# Patient Record
Sex: Female | Born: 1960 | Race: White | Hispanic: No | Smoking: Former smoker
Health system: Southern US, Community
[De-identification: ages and names within clinical notes are randomized; demographics above are authoritative.]

## PROBLEM LIST (undated history)

## (undated) DIAGNOSIS — E0590 Thyrotoxicosis, unspecified without thyrotoxic crisis or storm: Secondary | ICD-10-CM

## (undated) DIAGNOSIS — J439 Emphysema, unspecified: Secondary | ICD-10-CM

## (undated) DIAGNOSIS — F419 Anxiety disorder, unspecified: Secondary | ICD-10-CM

## (undated) DIAGNOSIS — Z9889 Other specified postprocedural states: Secondary | ICD-10-CM

## (undated) DIAGNOSIS — R112 Nausea with vomiting, unspecified: Secondary | ICD-10-CM

## (undated) DIAGNOSIS — Z8719 Personal history of other diseases of the digestive system: Secondary | ICD-10-CM

## (undated) DIAGNOSIS — T4145XA Adverse effect of unspecified anesthetic, initial encounter: Secondary | ICD-10-CM

## (undated) DIAGNOSIS — M1990 Unspecified osteoarthritis, unspecified site: Secondary | ICD-10-CM

## (undated) DIAGNOSIS — I10 Essential (primary) hypertension: Secondary | ICD-10-CM

## (undated) DIAGNOSIS — T8859XA Other complications of anesthesia, initial encounter: Secondary | ICD-10-CM

## (undated) DIAGNOSIS — J449 Chronic obstructive pulmonary disease, unspecified: Secondary | ICD-10-CM

## (undated) DIAGNOSIS — K219 Gastro-esophageal reflux disease without esophagitis: Secondary | ICD-10-CM

## (undated) DIAGNOSIS — H269 Unspecified cataract: Secondary | ICD-10-CM

## (undated) DIAGNOSIS — G709 Myoneural disorder, unspecified: Secondary | ICD-10-CM

## (undated) DIAGNOSIS — T7840XA Allergy, unspecified, initial encounter: Secondary | ICD-10-CM

## (undated) DIAGNOSIS — J45909 Unspecified asthma, uncomplicated: Secondary | ICD-10-CM

## (undated) DIAGNOSIS — E079 Disorder of thyroid, unspecified: Secondary | ICD-10-CM

## (undated) HISTORY — DX: Unspecified osteoarthritis, unspecified site: M19.90

## (undated) HISTORY — DX: Emphysema, unspecified: J43.9

## (undated) HISTORY — DX: Myoneural disorder, unspecified: G70.9

## (undated) HISTORY — DX: Unspecified cataract: H26.9

## (undated) HISTORY — PX: TUBAL LIGATION: SHX77

## (undated) HISTORY — DX: Disorder of thyroid, unspecified: E07.9

## (undated) HISTORY — DX: Unspecified asthma, uncomplicated: J45.909

## (undated) HISTORY — PX: SALPINGOOPHORECTOMY: SHX82

## (undated) HISTORY — DX: Anxiety disorder, unspecified: F41.9

## (undated) HISTORY — DX: Gastro-esophageal reflux disease without esophagitis: K21.9

## (undated) HISTORY — DX: Chronic obstructive pulmonary disease, unspecified: J44.9

## (undated) HISTORY — PX: FUNCTIONAL ENDOSCOPIC SINUS SURGERY: SUR616

## (undated) HISTORY — PX: CHOLECYSTECTOMY: SHX55

## (undated) HISTORY — PX: CARPAL TUNNEL RELEASE: SHX101

## (undated) HISTORY — DX: Allergy, unspecified, initial encounter: T78.40XA

## (undated) HISTORY — DX: Essential (primary) hypertension: I10

## (undated) HISTORY — PX: FRACTURE SURGERY: SHX138

---
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
## 2011-01-23 ENCOUNTER — Emergency Department: Payer: Self-pay | Admitting: Emergency Medicine

## 2014-03-19 ENCOUNTER — Emergency Department: Payer: Self-pay | Admitting: Emergency Medicine

## 2014-03-19 LAB — COMPREHENSIVE METABOLIC PANEL
ALT: 16 U/L
ANION GAP: 11 (ref 7–16)
AST: 24 U/L (ref 15–37)
Albumin: 4 g/dL (ref 3.4–5.0)
Alkaline Phosphatase: 104 U/L
BUN: 10 mg/dL (ref 7–18)
Bilirubin,Total: 0.6 mg/dL (ref 0.2–1.0)
CREATININE: 0.86 mg/dL (ref 0.60–1.30)
Calcium, Total: 10.4 mg/dL — ABNORMAL HIGH (ref 8.5–10.1)
Chloride: 101 mmol/L (ref 98–107)
Co2: 24 mmol/L (ref 21–32)
Glucose: 139 mg/dL — ABNORMAL HIGH (ref 65–99)
Osmolality: 273 (ref 275–301)
Potassium: 3 mmol/L — ABNORMAL LOW (ref 3.5–5.1)
Sodium: 136 mmol/L (ref 136–145)
Total Protein: 9.3 g/dL — ABNORMAL HIGH (ref 6.4–8.2)

## 2014-03-19 LAB — DRUG SCREEN, URINE
Amphetamines, Ur Screen: NEGATIVE (ref ?–1000)
BENZODIAZEPINE, UR SCRN: POSITIVE (ref ?–200)
Barbiturates, Ur Screen: NEGATIVE (ref ?–200)
Cannabinoid 50 Ng, Ur ~~LOC~~: NEGATIVE (ref ?–50)
Cocaine Metabolite,Ur ~~LOC~~: NEGATIVE (ref ?–300)
MDMA (Ecstasy)Ur Screen: NEGATIVE (ref ?–500)
Methadone, Ur Screen: NEGATIVE (ref ?–300)
Opiate, Ur Screen: POSITIVE (ref ?–300)
Phencyclidine (PCP) Ur S: NEGATIVE (ref ?–25)
Tricyclic, Ur Screen: NEGATIVE (ref ?–1000)

## 2014-03-19 LAB — CBC
HCT: 48.3 % — ABNORMAL HIGH (ref 35.0–47.0)
HGB: 15.7 g/dL (ref 12.0–16.0)
MCH: 30 pg (ref 26.0–34.0)
MCHC: 32.5 g/dL (ref 32.0–36.0)
MCV: 92 fL (ref 80–100)
Platelet: 269 10*3/uL (ref 150–440)
RBC: 5.23 10*6/uL — AB (ref 3.80–5.20)
RDW: 14.2 % (ref 11.5–14.5)
WBC: 10.9 10*3/uL (ref 3.6–11.0)

## 2014-03-19 LAB — URINALYSIS, COMPLETE
BILIRUBIN, UR: NEGATIVE
Bacteria: NONE SEEN
Glucose,UR: NEGATIVE mg/dL (ref 0–75)
NITRITE: NEGATIVE
PH: 5 (ref 4.5–8.0)
Protein: >=500
Specific Gravity: 1.018 (ref 1.003–1.030)
Squamous Epithelial: 5
WBC UR: 12 /HPF (ref 0–5)

## 2014-03-19 LAB — SALICYLATE LEVEL: Salicylates, Serum: <1.7 mg/dL

## 2014-03-19 LAB — ETHANOL

## 2014-03-19 LAB — ACETAMINOPHEN LEVEL: Acetaminophen: <2 ug/mL

## 2016-08-05 DIAGNOSIS — M7551 Bursitis of right shoulder: Secondary | ICD-10-CM | POA: Diagnosis not present

## 2016-08-05 DIAGNOSIS — M25511 Pain in right shoulder: Secondary | ICD-10-CM | POA: Diagnosis not present

## 2016-10-20 DIAGNOSIS — H2513 Age-related nuclear cataract, bilateral: Secondary | ICD-10-CM | POA: Diagnosis not present

## 2016-12-01 DIAGNOSIS — J441 Chronic obstructive pulmonary disease with (acute) exacerbation: Secondary | ICD-10-CM | POA: Diagnosis not present

## 2018-05-05 ENCOUNTER — Ambulatory Visit: Payer: Self-pay | Admitting: Physician Assistant

## 2018-06-15 DIAGNOSIS — Z23 Encounter for immunization: Secondary | ICD-10-CM | POA: Diagnosis not present

## 2018-09-03 DIAGNOSIS — J440 Chronic obstructive pulmonary disease with acute lower respiratory infection: Secondary | ICD-10-CM | POA: Diagnosis not present

## 2018-10-06 DIAGNOSIS — J449 Chronic obstructive pulmonary disease, unspecified: Secondary | ICD-10-CM | POA: Diagnosis not present

## 2018-11-02 DIAGNOSIS — Z760 Encounter for issue of repeat prescription: Secondary | ICD-10-CM | POA: Diagnosis not present

## 2018-11-02 DIAGNOSIS — J449 Chronic obstructive pulmonary disease, unspecified: Secondary | ICD-10-CM | POA: Diagnosis not present

## 2018-11-02 DIAGNOSIS — I10 Essential (primary) hypertension: Secondary | ICD-10-CM | POA: Diagnosis not present

## 2018-12-13 ENCOUNTER — Other Ambulatory Visit: Payer: Self-pay | Admitting: Physician Assistant

## 2018-12-13 ENCOUNTER — Encounter: Payer: Self-pay | Admitting: Physician Assistant

## 2018-12-13 ENCOUNTER — Ambulatory Visit: Payer: Medicaid Other | Admitting: Physician Assistant

## 2018-12-13 ENCOUNTER — Other Ambulatory Visit: Payer: Self-pay

## 2018-12-13 VITALS — BP 149/90 | HR 78 | Temp 97.0°F | Resp 16 | Ht 62.5 in | Wt 185.2 lb

## 2018-12-13 DIAGNOSIS — Z8639 Personal history of other endocrine, nutritional and metabolic disease: Secondary | ICD-10-CM

## 2018-12-13 DIAGNOSIS — R29818 Other symptoms and signs involving the nervous system: Secondary | ICD-10-CM

## 2018-12-13 DIAGNOSIS — J449 Chronic obstructive pulmonary disease, unspecified: Secondary | ICD-10-CM | POA: Diagnosis not present

## 2018-12-13 DIAGNOSIS — S25101S Unspecified injury of right innominate or subclavian artery, sequela: Secondary | ICD-10-CM | POA: Diagnosis not present

## 2018-12-13 DIAGNOSIS — Z1159 Encounter for screening for other viral diseases: Secondary | ICD-10-CM

## 2018-12-13 DIAGNOSIS — I10 Essential (primary) hypertension: Secondary | ICD-10-CM | POA: Diagnosis not present

## 2018-12-13 DIAGNOSIS — Z114 Encounter for screening for human immunodeficiency virus [HIV]: Secondary | ICD-10-CM | POA: Diagnosis not present

## 2018-12-13 MED ORDER — PROAIR HFA 108 (90 BASE) MCG/ACT IN AERS: 2.0000 | INHALATION_SPRAY | Freq: Four times a day (QID) | RESPIRATORY_TRACT | 1 refills | Status: DC | PRN

## 2018-12-13 MED ORDER — ADVAIR DISKUS 250-50 MCG/DOSE IN AEPB: INHALATION_SPRAY | RESPIRATORY_TRACT | 1 refills | Status: DC

## 2018-12-13 MED ORDER — LISINOPRIL-HYDROCHLOROTHIAZIDE 20-25 MG PO TABS: ORAL_TABLET | ORAL | 0 refills | Status: DC

## 2018-12-13 MED ORDER — SPIRIVA HANDIHALER 18 MCG IN CAPS: 18.0000 ug | ORAL_CAPSULE | Freq: Every day | RESPIRATORY_TRACT | 2 refills | Status: DC

## 2018-12-13 NOTE — Patient Instructions (Addendum)
Miralax capful daily in liquid of choice.    COPD and Physical Activity Chronic obstructive pulmonary disease (COPD) is a long-term (chronic) condition that affects the lungs. COPD is a general term that can be used to describe many different lung problems that cause lung swelling (inflammation) and limit airflow, including chronic bronchitis and emphysema. The main symptom of COPD is shortness of breath, which makes it harder to do even simple tasks. This can also make it harder to exercise and be active. Talk with your health care provider about treatments to help you breathe better and actions you can take to prevent breathing problems during physical activity. What are the benefits of exercising with COPD? Exercising regularly is an important part of a healthy lifestyle. You can still exercise and do physical activities even though you have COPD. Exercise and physical activity improve your shortness of breath by increasing blood flow (circulation). This causes your heart to pump more oxygen through your body. Moderate exercise can improve your:  Oxygen use.  Energy level.  Shortness of breath.  Strength in your breathing muscles.  Heart health.  Sleep.  Self-esteem and feelings of self-worth.  Depression, stress, and anxiety levels. Exercise can benefit everyone with COPD. The severity of your disease may affect how hard you can exercise, especially at first, but everyone can benefit. Talk with your health care provider about how much exercise is safe for you, and which activities and exercises are safe for you. What actions can I take to prevent breathing problems during physical activity?  Sign up for a pulmonary rehabilitation program. This type of program may include: ? Education about lung diseases. ? Exercise classes that teach you how to exercise and be more active while improving your breathing. This usually involves:  Exercise using your lower extremities, such as a  stationary bicycle.  About 30 minutes of exercise, 2 to 5 times per week, for 6 to 12 weeks  Strength training, such as push ups or leg lifts. ? Nutrition education. ? Group classes in which you can talk with others who also have COPD and learn ways to manage stress.  If you use an oxygen tank, you should use it while you exercise. Work with your health care provider to adjust your oxygen for your physical activity. Your resting flow rate is different from your flow rate during physical activity.  While you are exercising: ? Take slow breaths. ? Pace yourself and do not try to go too fast. ? Purse your lips while breathing out. Pursing your lips is similar to a kissing or whistling position. ? If doing exercise that uses a quick burst of effort, such as weight lifting:  Breathe in before starting the exercise.  Breathe out during the hardest part of the exercise (such as raising the weights). Where to find support You can find support for exercising with COPD from:  Your health care provider.  A pulmonary rehabilitation program.  Your local health department or community health programs.  Support groups, online or in-person. Your health care provider may be able to recommend support groups. Where to find more information You can find more information about exercising with COPD from:  American Lung Association: [place].hu.  COPD Foundation: AlmostHot.gl. Contact a health care provider if:  Your symptoms get worse.  You have chest pain.  You have nausea.  You have a fever.  You have trouble talking or catching your breath.  You want to start a new exercise program or a new  activity. Summary  COPD is a general term that can be used to describe many different lung problems that cause lung swelling (inflammation) and limit airflow. This includes chronic bronchitis and emphysema.  Exercise and physical activity improve your shortness of breath by increasing blood  flow (circulation). This causes your heart to provide more oxygen to your body.  Contact your health care provider before starting any exercise program or new activity. Ask your health care provider what exercises and activities are safe for you. This information is not intended to replace advice given to you by your health care provider. Make sure you discuss any questions you have with your health care provider. Document Released: 07/21/2017 Document Revised: 07/21/2017 Document Reviewed: 07/21/2017 Elsevier Interactive Patient Education  2019 [employer]
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
## 2018-12-14 LAB — COMPREHENSIVE METABOLIC PANEL
ALT: 10 IU/L (ref 0–32)
AST: 21 IU/L (ref 0–40)
Albumin/Globulin Ratio: 1.2 (ref 1.2–2.2)
Albumin: 3.7 g/dL — ABNORMAL LOW (ref 3.8–4.9)
Alkaline Phosphatase: 111 IU/L (ref 39–117)
BUN/Creatinine Ratio: 11 (ref 9–23)
BUN: 8 mg/dL (ref 6–24)
Bilirubin Total: 0.2 mg/dL (ref 0.0–1.2)
CO2: 25 mmol/L (ref 20–29)
Calcium: 9.5 mg/dL (ref 8.7–10.2)
Chloride: 104 mmol/L (ref 96–106)
Creatinine, Ser: 0.71 mg/dL (ref 0.57–1.00)
GFR calc Af Amer: 108 mL/min/{1.73_m2} (ref >59)
GFR calc non Af Amer: 94 mL/min/{1.73_m2} (ref >59)
Globulin, Total: 3 g/dL (ref 1.5–4.5)
Glucose: 89 mg/dL (ref 65–99)
Potassium: 5.1 mmol/L (ref 3.5–5.2)
Sodium: 141 mmol/L (ref 134–144)
Total Protein: 6.7 g/dL (ref 6.0–8.5)

## 2018-12-14 LAB — CBC WITH DIFFERENTIAL/PLATELET
Basophils Absolute: 0.1 10*3/uL (ref 0.0–0.2)
Basos: 1 %
EOS (ABSOLUTE): 0.5 10*3/uL — ABNORMAL HIGH (ref 0.0–0.4)
Eos: 4 %
Hematocrit: 37.6 % (ref 34.0–46.6)
Hemoglobin: 13.2 g/dL (ref 11.1–15.9)
Immature Grans (Abs): 0 10*3/uL (ref 0.0–0.1)
Immature Granulocytes: 0 %
Lymphocytes Absolute: 3.1 10*3/uL (ref 0.7–3.1)
Lymphs: 29 %
MCH: 31.5 pg (ref 26.6–33.0)
MCHC: 35.1 g/dL (ref 31.5–35.7)
MCV: 90 fL (ref 79–97)
Monocytes Absolute: 1 10*3/uL — ABNORMAL HIGH (ref 0.1–0.9)
Monocytes: 9 %
Neutrophils Absolute: 6.2 10*3/uL (ref 1.4–7.0)
Neutrophils: 57 %
Platelets: 235 10*3/uL (ref 150–450)
RBC: 4.19 x10E6/uL (ref 3.77–5.28)
RDW: 11.7 % (ref 11.7–15.4)
WBC: 10.9 10*3/uL — ABNORMAL HIGH (ref 3.4–10.8)

## 2018-12-14 LAB — HIV ANTIBODY (ROUTINE TESTING W REFLEX): HIV Screen 4th Generation wRfx: NONREACTIVE

## 2018-12-14 LAB — LIPID PANEL
Chol/HDL Ratio: 3.9 ratio (ref 0.0–4.4)
Cholesterol, Total: 175 mg/dL (ref 100–199)
HDL: 45 mg/dL (ref >39)
LDL Calculated: 111 mg/dL — ABNORMAL HIGH (ref 0–99)
Triglycerides: 96 mg/dL (ref 0–149)
VLDL Cholesterol Cal: 19 mg/dL (ref 5–40)

## 2018-12-14 LAB — TSH: TSH: 0.938 u[IU]/mL (ref 0.450–4.500)

## 2018-12-14 LAB — HCV COMMENT:

## 2018-12-14 LAB — HEPATITIS C ANTIBODY (REFLEX): HCV Ab: 0.1 s/co ratio (ref 0.0–0.9)

## 2018-12-15 ENCOUNTER — Telehealth: Payer: Self-pay

## 2018-12-15 ENCOUNTER — Encounter: Payer: Self-pay | Admitting: Physician Assistant

## 2018-12-15 NOTE — Telephone Encounter (Signed)
Patient advised as below.  

## 2018-12-15 NOTE — Telephone Encounter (Signed)
-----   Message from [name], [place] sent at 12/15/2018 11:23 AM EDT ----- Christy Mason looks normal. Cholesterol is a little high but if we control her blood pressure and work on getting her to stop smoking, it will likely improve and not need a medication. Can discuss more at follow up.

## 2018-12-20 ENCOUNTER — Telehealth: Payer: Self-pay | Admitting: Physician Assistant

## 2018-12-20 NOTE — Telephone Encounter (Signed)
Patient advised as below. Patient reports she wants to wait on in lab sleep study. Patient will call us back when she is ready.

## 2018-12-20 NOTE — Telephone Encounter (Signed)
A home sleep study was ordered for pt but she has Medicaid and they will not pay unless it's done in lab

## 2018-12-20 NOTE — Telephone Encounter (Signed)
Can we call patient and let her know medicaid requires in lab sleep study? If she is agreeable, I will place order.

## 2019-01-08 ENCOUNTER — Encounter (INDEPENDENT_AMBULATORY_CARE_PROVIDER_SITE_OTHER): Payer: Self-pay

## 2019-01-08 ENCOUNTER — Ambulatory Visit (INDEPENDENT_AMBULATORY_CARE_PROVIDER_SITE_OTHER): Payer: Medicaid Other

## 2019-01-08 ENCOUNTER — Ambulatory Visit (INDEPENDENT_AMBULATORY_CARE_PROVIDER_SITE_OTHER): Payer: Medicaid Other | Admitting: Vascular Surgery

## 2019-01-08 ENCOUNTER — Encounter (INDEPENDENT_AMBULATORY_CARE_PROVIDER_SITE_OTHER): Payer: Self-pay | Admitting: Vascular Surgery

## 2019-01-08 ENCOUNTER — Other Ambulatory Visit: Payer: Self-pay

## 2019-01-08 ENCOUNTER — Other Ambulatory Visit (INDEPENDENT_AMBULATORY_CARE_PROVIDER_SITE_OTHER): Payer: Self-pay | Admitting: Vascular Surgery

## 2019-01-08 DIAGNOSIS — I10 Essential (primary) hypertension: Secondary | ICD-10-CM

## 2019-01-08 DIAGNOSIS — J449 Chronic obstructive pulmonary disease, unspecified: Secondary | ICD-10-CM | POA: Diagnosis not present

## 2019-01-08 DIAGNOSIS — S25101S Unspecified injury of right innominate or subclavian artery, sequela: Secondary | ICD-10-CM

## 2019-01-08 DIAGNOSIS — T829XXS Unspecified complication of cardiac and vascular prosthetic device, implant and graft, sequela: Secondary | ICD-10-CM

## 2019-01-08 DIAGNOSIS — T829XXA Unspecified complication of cardiac and vascular prosthetic device, implant and graft, initial encounter: Secondary | ICD-10-CM | POA: Diagnosis not present

## 2019-01-08 DIAGNOSIS — Z79899 Other long term (current) drug therapy: Secondary | ICD-10-CM

## 2019-01-08 DIAGNOSIS — S25109A Unspecified injury of unspecified innominate or subclavian artery, initial encounter: Secondary | ICD-10-CM | POA: Insufficient documentation

## 2019-01-08 DIAGNOSIS — F17210 Nicotine dependence, cigarettes, uncomplicated: Secondary | ICD-10-CM
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
## 2019-01-10 ENCOUNTER — Ambulatory Visit (INDEPENDENT_AMBULATORY_CARE_PROVIDER_SITE_OTHER): Payer: Medicaid Other | Admitting: Physician Assistant

## 2019-01-10 ENCOUNTER — Other Ambulatory Visit (HOSPITAL_COMMUNITY)
Admission: RE | Admit: 2019-01-10 | Discharge: 2019-01-10 | Disposition: A | Discharge status: Home / Self Care | Payer: Medicaid Other | Source: Ambulatory Visit | Attending: Physician Assistant | Admitting: Physician Assistant

## 2019-01-10 ENCOUNTER — Other Ambulatory Visit: Payer: Self-pay | Admitting: Physician Assistant

## 2019-01-10 ENCOUNTER — Other Ambulatory Visit: Payer: Self-pay

## 2019-01-10 ENCOUNTER — Encounter: Payer: Self-pay | Admitting: Physician Assistant

## 2019-01-10 VITALS — BP 131/89 | HR 92 | Temp 98.5°F | Resp 16 | Ht 62.5 in | Wt 185.0 lb

## 2019-01-10 DIAGNOSIS — Z124 Encounter for screening for malignant neoplasm of cervix: Secondary | ICD-10-CM | POA: Insufficient documentation

## 2019-01-10 DIAGNOSIS — Z1239 Encounter for other screening for malignant neoplasm of breast: Secondary | ICD-10-CM | POA: Diagnosis not present

## 2019-01-10 DIAGNOSIS — K5903 Drug induced constipation: Secondary | ICD-10-CM | POA: Diagnosis not present

## 2019-01-10 DIAGNOSIS — T402X5A Adverse effect of other opioids, initial encounter: Secondary | ICD-10-CM | POA: Diagnosis not present

## 2019-01-10 DIAGNOSIS — Z1211 Encounter for screening for malignant neoplasm of colon: Secondary | ICD-10-CM | POA: Diagnosis not present

## 2019-01-10 DIAGNOSIS — G8929 Other chronic pain: Secondary | ICD-10-CM | POA: Diagnosis not present

## 2019-01-10 DIAGNOSIS — M25511 Pain in right shoulder: Secondary | ICD-10-CM

## 2019-01-10 DIAGNOSIS — I10 Essential (primary) hypertension: Secondary | ICD-10-CM

## 2019-01-10 DIAGNOSIS — Z0000 Encounter for general adult medical examination without abnormal findings: Secondary | ICD-10-CM | POA: Diagnosis not present

## 2019-01-10 MED ORDER — LUBIPROSTONE 24 MCG PO CAPS: 24.0000 ug | ORAL_CAPSULE | Freq: Two times a day (BID) | ORAL | 0 refills | Status: AC
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
## 2019-01-10 NOTE — Patient Instructions (Signed)
Health Maintenance, Female Adopting a healthy lifestyle and getting preventive care are important in promoting health and wellness. Ask your health care provider about:  The right schedule for you to have regular tests and exams.  Things you can do on your own to prevent diseases and keep yourself healthy. What should I know about diet, weight, and exercise? Eat a healthy diet   Eat a diet that includes plenty of vegetables, fruits, low-fat dairy products, and lean protein.  Do not eat a lot of foods that are high in solid fats, added sugars, or sodium. Maintain a healthy weight Body mass index (BMI) is used to identify weight problems. It estimates body fat based on height and weight. Your health care provider can help determine your BMI and help you achieve or maintain a healthy weight. Get regular exercise Get regular exercise. This is one of the most important things you can do for your health. Most adults should:  Exercise for at least 150 minutes each week. The exercise should increase your heart rate and make you sweat (moderate-intensity exercise).  Do strengthening exercises at least twice a week. This is in addition to the moderate-intensity exercise.  Spend less time sitting. Even light physical activity can be beneficial. Watch cholesterol and blood lipids Have your blood tested for lipids and cholesterol at 60 years of age, then have this test every 5 years. Have your cholesterol levels checked more often if:  Your lipid or cholesterol levels are high.  You are older than 60 years of age.  You are at high risk for heart disease. What should I know about cancer screening? Depending on your health history and family history, you may need to have cancer screening at various ages. This may include screening for:  Breast cancer.  Cervical cancer.  Colorectal cancer.  Skin cancer.  Lung cancer. What should I know about heart disease, diabetes, and high blood  pressure? Blood pressure and heart disease  High blood pressure causes heart disease and increases the risk of stroke. This is more likely to develop in people who have high blood pressure readings, are of African descent, or are overweight.  Have your blood pressure checked: ? Every 3-5 years if you are 18-39 years of age. ? Every year if you are 40 years old or older. Diabetes Have regular diabetes screenings. This checks your fasting blood sugar level. Have the screening done:  Once every three years after age 40 if you are at a normal weight and have a low risk for diabetes.  More often and at a younger age if you are overweight or have a high risk for diabetes. What should I know about preventing infection? Hepatitis B If you have a higher risk for hepatitis B, you should be screened for this virus. Talk with your health care provider to find out if you are at risk for hepatitis B infection. Hepatitis C Testing is recommended for:  Everyone born from 1945 through 1965.  Anyone with known risk factors for hepatitis C. Sexually transmitted infections (STIs)  Get screened for STIs, including gonorrhea and chlamydia, if: ? You are sexually active and are younger than 60 years of age. ? You are older than 60 years of age and your health care provider tells you that you are at risk for this type of infection. ? Your sexual activity has changed since you were last screened, and you are at increased risk for chlamydia or gonorrhea. Ask your health care provider if   you are at risk.  Ask your health care provider about whether you are at high risk for HIV. Your health care provider may recommend a prescription medicine to help prevent HIV infection. If you choose to take medicine to prevent HIV, you should first get tested for HIV. You should then be tested every 3 months for as long as you are taking the medicine. Pregnancy  If you are about to stop having your period (premenopausal) and  you may become pregnant, seek counseling before you get pregnant.  Take 400 to 800 micrograms (mcg) of folic acid every day if you become pregnant.  Ask for birth control (contraception) if you want to prevent pregnancy. Osteoporosis and menopause Osteoporosis is a disease in which the bones lose minerals and strength with aging. This can result in bone fractures. If you are 65 years old or older, or if you are at risk for osteoporosis and fractures, ask your health care provider if you should:  Be screened for bone loss.  Take a calcium or vitamin D supplement to lower your risk of fractures.  Be given hormone replacement therapy (HRT) to treat symptoms of menopause. Follow these instructions at home: Lifestyle  Do not use any products that contain nicotine or tobacco, such as cigarettes, e-cigarettes, and chewing tobacco. If you need help quitting, ask your health care provider.  Do not use street drugs.  Do not share needles.  Ask your health care provider for help if you need support or information about quitting drugs. Alcohol use  Do not drink alcohol if: ? Your health care provider tells you not to drink. ? You are pregnant, may be pregnant, or are planning to become pregnant.  If you drink alcohol: ? Limit how much you use to 0-1 drink a day. ? Limit intake if you are breastfeeding.  Be aware of how much alcohol is in your drink. In the [place] one drink equals one 12 oz bottle of beer (355 mL), one 5 oz glass of wine (148 mL), or one 1 oz glass of hard liquor (44 mL). General instructions  Schedule regular health, dental, and eye exams.  Stay current with your vaccines.  Tell your health care provider if: ? You often feel depressed. ? You have ever been abused or do not feel safe at home. Summary  Adopting a healthy lifestyle and getting preventive care are important in promoting health and wellness.  Follow your health care provider's instructions about healthy  diet, exercising, and getting tested or screened for diseases.  Follow your health care provider's instructions on monitoring your cholesterol and blood pressure. This information is not intended to replace advice given to you by your health care provider. Make sure you discuss any questions you have with your health care provider. Document Released: 01/11/2011 Document Revised: 06/21/2018 Document Reviewed: 06/21/2018 Elsevier Patient Education  2020 Elsevier Inc.  

## 2019-01-16 ENCOUNTER — Telehealth: Payer: Self-pay

## 2019-01-16 LAB — CYTOLOGY - PAP
Diagnosis: NEGATIVE
HPV: NOT DETECTED

## 2019-01-16 NOTE — Telephone Encounter (Signed)
Patient advised as below.  

## 2019-01-16 NOTE — Telephone Encounter (Signed)
NA

## 2019-01-16 NOTE — Telephone Encounter (Signed)
-----   Message from Trinna Post, [place] sent at 01/16/2019 10:56 AM EDT ----- PAP is normal and HPV is negative. Repeat in 5 years.

## 2019-01-17 ENCOUNTER — Other Ambulatory Visit (INDEPENDENT_AMBULATORY_CARE_PROVIDER_SITE_OTHER): Payer: Self-pay | Admitting: Nurse Practitioner

## 2019-01-19 ENCOUNTER — Other Ambulatory Visit: Payer: Self-pay

## 2019-01-19 ENCOUNTER — Other Ambulatory Visit
Admission: RE | Admit: 2019-01-19 | Discharge: 2019-01-19 | Disposition: A | Discharge status: Home / Self Care | Payer: Medicaid Other | Source: Ambulatory Visit | Attending: Vascular Surgery | Admitting: Vascular Surgery

## 2019-01-19 DIAGNOSIS — Z01812 Encounter for preprocedural laboratory examination: Secondary | ICD-10-CM | POA: Insufficient documentation

## 2019-01-19 DIAGNOSIS — Z1159 Encounter for screening for other viral diseases: Secondary | ICD-10-CM | POA: Insufficient documentation

## 2019-01-19 LAB — CREATININE, SERUM
Creatinine, Ser: 0.83 mg/dL (ref 0.44–1.00)
GFR calc Af Amer: >60 mL/min (ref >60)
GFR calc non Af Amer: >60 mL/min (ref >60)

## 2019-01-19 LAB — SARS CORONAVIRUS 2 (TAT 6-24 HRS): SARS Coronavirus 2: NEGATIVE

## 2019-01-19 LAB — BUN: BUN: 12 mg/dL (ref 6–20)

## 2019-01-22 MED ORDER — CEFAZOLIN SODIUM-DEXTROSE 2-4 GM/100ML-% IV SOLN: 2.0000 g | Freq: Once | INTRAVENOUS | Status: DC

## 2019-01-23 ENCOUNTER — Other Ambulatory Visit: Payer: Self-pay

## 2019-01-23 ENCOUNTER — Ambulatory Visit
Admission: RE | Admit: 2019-01-23 | Discharge: 2019-01-23 | Disposition: A | Discharge status: Home / Self Care | Payer: Medicaid Other | Attending: Vascular Surgery | Admitting: Vascular Surgery

## 2019-01-23 ENCOUNTER — Encounter
Admission: RE | Disposition: A | Discharge status: Home / Self Care | Payer: Self-pay | Source: Home / Self Care | Attending: Vascular Surgery

## 2019-01-23 DIAGNOSIS — T82856A Stenosis of peripheral vascular stent, initial encounter: Secondary | ICD-10-CM | POA: Diagnosis not present

## 2019-01-23 DIAGNOSIS — J449 Chronic obstructive pulmonary disease, unspecified: Secondary | ICD-10-CM | POA: Insufficient documentation

## 2019-01-23 DIAGNOSIS — I10 Essential (primary) hypertension: Secondary | ICD-10-CM | POA: Insufficient documentation

## 2019-01-23 DIAGNOSIS — F17210 Nicotine dependence, cigarettes, uncomplicated: Secondary | ICD-10-CM | POA: Diagnosis not present

## 2019-01-23 DIAGNOSIS — F419 Anxiety disorder, unspecified: Secondary | ICD-10-CM | POA: Diagnosis not present

## 2019-01-23 DIAGNOSIS — S25101S Unspecified injury of right innominate or subclavian artery, sequela: Secondary | ICD-10-CM | POA: Diagnosis not present

## 2019-01-23 DIAGNOSIS — K219 Gastro-esophageal reflux disease without esophagitis: Secondary | ICD-10-CM | POA: Insufficient documentation

## 2019-01-23 DIAGNOSIS — I70229 Atherosclerosis of native arteries of extremities with rest pain, unspecified extremity: Secondary | ICD-10-CM

## 2019-01-23 DIAGNOSIS — I70228 Atherosclerosis of native arteries of extremities with rest pain, other extremity: Secondary | ICD-10-CM | POA: Diagnosis not present

## 2019-01-23 DIAGNOSIS — E079 Disorder of thyroid, unspecified: Secondary | ICD-10-CM | POA: Diagnosis not present

## 2019-01-23 HISTORY — PX: UPPER EXTREMITY ANGIOGRAPHY: CATH118270

## 2019-01-23 SURGERY — UPPER EXTREMITY ANGIOGRAPHY
Anesthesia: Moderate Sedation | Laterality: Right

## 2019-01-23 MED ORDER — ONDANSETRON HCL 4 MG/2ML IJ SOLN: 4.0000 mg | Freq: Four times a day (QID) | INTRAMUSCULAR | Status: DC | PRN

## 2019-01-23 MED ORDER — FENTANYL CITRATE (PF) 100 MCG/2ML IJ SOLN
INTRAMUSCULAR | Status: DC | PRN
  Administered 2019-01-23: 25 ug via INTRAVENOUS
  Administered 2019-01-23: 50 ug via INTRAVENOUS

## 2019-01-23 MED ORDER — LABETALOL HCL 5 MG/ML IV SOLN: 10.0000 mg | INTRAVENOUS | Status: DC | PRN

## 2019-01-23 MED ORDER — FENTANYL CITRATE (PF) 100 MCG/2ML IJ SOLN
INTRAMUSCULAR | Status: AC
  Administered 2019-01-23: 10:00:00
  Filled 2019-01-23: qty 2

## 2019-01-23 MED ORDER — HEPARIN (PORCINE) IN NACL 1000-0.9 UT/500ML-% IV SOLN
INTRAVENOUS | Status: AC
  Filled 2019-01-23: qty 1000

## 2019-01-23 MED ORDER — LIDOCAINE HCL (PF) 1 % IJ SOLN
INTRAMUSCULAR | Status: AC
  Filled 2019-01-23: qty 30

## 2019-01-23 MED ORDER — MORPHINE SULFATE (PF) 4 MG/ML IV SOLN: 2.0000 mg | INTRAVENOUS | Status: DC | PRN

## 2019-01-23 MED ORDER — MIDAZOLAM HCL 2 MG/2ML IJ SOLN
INTRAMUSCULAR | Status: DC | PRN
  Administered 2019-01-23: 0.5 mg via INTRAVENOUS
  Administered 2019-01-23: 1 mg via INTRAVENOUS

## 2019-01-23 MED ORDER — SODIUM CHLORIDE 0.9% FLUSH: 3.0000 mL | INTRAVENOUS | Status: DC | PRN

## 2019-01-23 MED ORDER — HEPARIN SODIUM (PORCINE) 1000 UNIT/ML IJ SOLN
INTRAMUSCULAR | Status: AC
  Filled 2019-01-23: qty 1

## 2019-01-23 MED ORDER — HEPARIN SODIUM (PORCINE) 1000 UNIT/ML IJ SOLN
INTRAMUSCULAR | Status: DC | PRN
  Administered 2019-01-23: 5000 [IU] via INTRAVENOUS

## 2019-01-23 MED ORDER — SODIUM CHLORIDE 0.9 % IV BOLUS
INTRAVENOUS | Status: AC | PRN
  Administered 2019-01-23: 250 mL via INTRAVENOUS

## 2019-01-23 MED ORDER — SODIUM CHLORIDE 0.9 % IV SOLN
INTRAVENOUS | Status: DC
  Administered 2019-01-23: 1000 mL via INTRAVENOUS

## 2019-01-23 MED ORDER — HYDRALAZINE HCL 20 MG/ML IJ SOLN: 5.0000 mg | INTRAMUSCULAR | Status: DC | PRN

## 2019-01-23 MED ORDER — HYDROMORPHONE HCL 1 MG/ML IJ SOLN: 1.0000 mg | Freq: Once | INTRAMUSCULAR | Status: DC | PRN

## 2019-01-23 MED ORDER — CEFAZOLIN SODIUM-DEXTROSE 2-4 GM/100ML-% IV SOLN
INTRAVENOUS | Status: AC
  Administered 2019-01-23: 2 g
  Filled 2019-01-23: qty 100

## 2019-01-23 MED ORDER — DIPHENHYDRAMINE HCL 50 MG/ML IJ SOLN: 50.0000 mg | Freq: Once | INTRAMUSCULAR | Status: DC | PRN

## 2019-01-23 MED ORDER — SODIUM CHLORIDE 0.9% FLUSH: 3.0000 mL | Freq: Two times a day (BID) | INTRAVENOUS | Status: DC

## 2019-01-23 MED ORDER — SODIUM CHLORIDE 0.9 % IV SOLN: INTRAVENOUS | Status: DC

## 2019-01-23 MED ORDER — SODIUM CHLORIDE 0.9 % IV SOLN: 250.0000 mL | INTRAVENOUS | Status: DC | PRN

## 2019-01-23 MED ORDER — MIDAZOLAM HCL 2 MG/ML PO SYRP
ORAL_SOLUTION | ORAL | Status: AC
  Filled 2019-01-23: qty 4

## 2019-01-23 MED ORDER — MIDAZOLAM HCL 2 MG/ML PO SYRP
8.0000 mg | ORAL_SOLUTION | Freq: Once | ORAL | Status: AC | PRN
  Administered 2019-01-23: 8 mg via ORAL

## 2019-01-23 MED ORDER — MIDAZOLAM HCL 5 MG/5ML IJ SOLN
INTRAMUSCULAR | Status: AC
  Administered 2019-01-23: 1 mg
  Filled 2019-01-23: qty 5

## 2019-01-23 MED ORDER — FAMOTIDINE 20 MG PO TABS: 40.0000 mg | ORAL_TABLET | Freq: Once | ORAL | Status: DC | PRN

## 2019-01-23 MED ORDER — METHYLPREDNISOLONE SODIUM SUCC 125 MG IJ SOLR: 125.0000 mg | Freq: Once | INTRAMUSCULAR | Status: DC | PRN

## 2019-01-23 MED ORDER — ACETAMINOPHEN 325 MG PO TABS: 650.0000 mg | ORAL_TABLET | ORAL | Status: DC | PRN

## 2019-01-23 MED ORDER — OXYCODONE HCL 5 MG PO TABS: 5.0000 mg | ORAL_TABLET | ORAL | Status: DC | PRN

## 2019-01-23 SURGICAL SUPPLY — 16 items
CATH ANGIO 5F 100CM .035 PIG (CATHETERS) ×1 IMPLANT
CATH BEACON 5 .035 100 H1 TIP (CATHETERS) ×1 IMPLANT
CATH INFINITI JR4 5F (CATHETERS) ×1 IMPLANT
CATH VERT 5FR 125CM (CATHETERS) ×1 IMPLANT
DEVICE STARCLOSE SE CLOSURE (Vascular Products) ×1 IMPLANT
DEVICE TORQUE .025-.038 (MISCELLANEOUS) ×2 IMPLANT
GLIDEWIRE ADV .035X180CM (WIRE) ×1 IMPLANT
NDL ENTRY 21GA 7CM ECHOTIP (NEEDLE) IMPLANT
NEEDLE ENTRY 21GA 7CM ECHOTIP (NEEDLE) ×2 IMPLANT
SET INTRO CAPELLA COAXIAL (SET/KITS/TRAYS/PACK) ×1 IMPLANT
SHEATH BRITE TIP 5FRX11 (SHEATH) ×1 IMPLANT
SHEATH RAABE 6FRX70 (SHEATH) ×1 IMPLANT
TUBING CONTRAST HIGH PRESS 72 (TUBING) ×1 IMPLANT
WIRE AQUATRACK .035X260CM (WIRE) ×1 IMPLANT
WIRE J 3MM .035X145CM (WIRE) ×1 IMPLANT
WIRE MAGIC TORQUE 260C (WIRE) ×1 IMPLANT
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
## 2019-01-23 NOTE — H&P (Signed)
[facility] History & Physical Update  The patient was interviewed and re-examined.  The patient's previous History and Physical has been reviewed and is unchanged.  There is no change in the plan of care. We plan to proceed with the scheduled procedure.  Hortencia Pilar, MD  01/23/2019, 9:33 AM

## 2019-01-24 ENCOUNTER — Encounter: Payer: Self-pay | Admitting: Vascular Surgery

## 2019-01-29 ENCOUNTER — Ambulatory Visit (INDEPENDENT_AMBULATORY_CARE_PROVIDER_SITE_OTHER): Payer: Medicaid Other | Admitting: Vascular Surgery

## 2019-01-29 ENCOUNTER — Other Ambulatory Visit: Payer: Self-pay

## 2019-01-29 ENCOUNTER — Encounter (INDEPENDENT_AMBULATORY_CARE_PROVIDER_SITE_OTHER): Payer: Self-pay | Admitting: Vascular Surgery

## 2019-01-29 VITALS — BP 108/60 | HR 80 | Resp 16 | Wt 184.0 lb

## 2019-01-29 DIAGNOSIS — J449 Chronic obstructive pulmonary disease, unspecified: Secondary | ICD-10-CM | POA: Diagnosis not present

## 2019-01-29 DIAGNOSIS — S25101S Unspecified injury of right innominate or subclavian artery, sequela: Secondary | ICD-10-CM

## 2019-01-29 DIAGNOSIS — T829XXS Unspecified complication of cardiac and vascular prosthetic device, implant and graft, sequela: Secondary | ICD-10-CM

## 2019-01-29 DIAGNOSIS — T82856A Stenosis of peripheral vascular stent, initial encounter: Secondary | ICD-10-CM | POA: Diagnosis not present

## 2019-01-29 DIAGNOSIS — I10 Essential (primary) hypertension: Secondary | ICD-10-CM | POA: Diagnosis not present
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
## 2019-01-30 ENCOUNTER — Telehealth (INDEPENDENT_AMBULATORY_CARE_PROVIDER_SITE_OTHER): Payer: Self-pay

## 2019-01-30 ENCOUNTER — Other Ambulatory Visit (INDEPENDENT_AMBULATORY_CARE_PROVIDER_SITE_OTHER): Payer: Self-pay

## 2019-01-30 NOTE — Telephone Encounter (Signed)
Spoke with the patient and she is now scheduled with Dr. Lucky Cowboy for 02/07/2019 with a 10:00 am arrival to to the MM, patient will do her Covid testing/ pre-op  on 02/02/2019 at 1:30 pm. Pre-procedure instructions were discussed and this will also be mailed out to the patient.

## 2019-02-02 ENCOUNTER — Other Ambulatory Visit: Payer: Self-pay

## 2019-02-02 ENCOUNTER — Encounter
Admission: RE | Admit: 2019-02-02 | Discharge: 2019-02-02 | Disposition: A | Discharge status: Home / Self Care | Payer: Medicaid Other | Source: Ambulatory Visit | Attending: Vascular Surgery | Admitting: Vascular Surgery

## 2019-02-02 DIAGNOSIS — I10 Essential (primary) hypertension: Secondary | ICD-10-CM | POA: Insufficient documentation

## 2019-02-02 DIAGNOSIS — Z01818 Encounter for other preprocedural examination: Secondary | ICD-10-CM | POA: Insufficient documentation

## 2019-02-02 DIAGNOSIS — Z1159 Encounter for screening for other viral diseases: Secondary | ICD-10-CM | POA: Insufficient documentation

## 2019-02-02 DIAGNOSIS — F17210 Nicotine dependence, cigarettes, uncomplicated: Secondary | ICD-10-CM | POA: Diagnosis not present

## 2019-02-02 HISTORY — DX: Thyrotoxicosis, unspecified without thyrotoxic crisis or storm: E05.90

## 2019-02-02 HISTORY — DX: Other complications of anesthesia, initial encounter: T88.59XA

## 2019-02-02 HISTORY — DX: Other specified postprocedural states: Z98.890

## 2019-02-02 HISTORY — DX: Personal history of other diseases of the digestive system: Z87.19

## 2019-02-02 HISTORY — DX: Nausea with vomiting, unspecified: R11.2

## 2019-02-02 LAB — BASIC METABOLIC PANEL
Anion gap: 8 (ref 5–15)
BUN: 13 mg/dL (ref 6–20)
CO2: 28 mmol/L (ref 22–32)
Calcium: 9.1 mg/dL (ref 8.9–10.3)
Chloride: 100 mmol/L (ref 98–111)
Creatinine, Ser: 1.01 mg/dL — ABNORMAL HIGH (ref 0.44–1.00)
GFR calc Af Amer: >60 mL/min (ref >60)
GFR calc non Af Amer: >60 mL/min (ref >60)
Glucose, Bld: 89 mg/dL (ref 70–99)
Potassium: 4.1 mmol/L (ref 3.5–5.1)
Sodium: 136 mmol/L (ref 135–145)

## 2019-02-03 LAB — SARS CORONAVIRUS 2 (TAT 6-24 HRS): SARS Coronavirus 2: NEGATIVE

## 2019-02-06 MED ORDER — CEFAZOLIN SODIUM-DEXTROSE 2-4 GM/100ML-% IV SOLN
2.0000 g | Freq: Once | INTRAVENOUS | Status: AC
  Administered 2019-02-07: 14:00:00 2 g via INTRAVENOUS

## 2019-02-07 ENCOUNTER — Encounter
Admission: RE | Disposition: A | Discharge status: Home / Self Care | Payer: Self-pay | Source: Ambulatory Visit | Attending: Vascular Surgery

## 2019-02-07 ENCOUNTER — Ambulatory Visit: Payer: Medicaid Other | Admitting: Anesthesiology

## 2019-02-07 ENCOUNTER — Other Ambulatory Visit: Payer: Self-pay

## 2019-02-07 ENCOUNTER — Observation Stay
Admission: RE | Admit: 2019-02-07 | Discharge: 2019-02-09 | Disposition: A | Discharge status: Home / Self Care | Payer: Medicaid Other | Source: Ambulatory Visit | Attending: Vascular Surgery | Admitting: Vascular Surgery

## 2019-02-07 DIAGNOSIS — M1990 Unspecified osteoarthritis, unspecified site: Secondary | ICD-10-CM | POA: Diagnosis not present

## 2019-02-07 DIAGNOSIS — I70228 Atherosclerosis of native arteries of extremities with rest pain, other extremity: Secondary | ICD-10-CM | POA: Diagnosis not present

## 2019-02-07 DIAGNOSIS — F419 Anxiety disorder, unspecified: Secondary | ICD-10-CM | POA: Insufficient documentation

## 2019-02-07 DIAGNOSIS — M79601 Pain in right arm: Secondary | ICD-10-CM | POA: Insufficient documentation

## 2019-02-07 DIAGNOSIS — F17210 Nicotine dependence, cigarettes, uncomplicated: Secondary | ICD-10-CM | POA: Insufficient documentation

## 2019-02-07 DIAGNOSIS — T82856A Stenosis of peripheral vascular stent, initial encounter: Secondary | ICD-10-CM | POA: Diagnosis not present

## 2019-02-07 DIAGNOSIS — Z79899 Other long term (current) drug therapy: Secondary | ICD-10-CM | POA: Diagnosis not present

## 2019-02-07 DIAGNOSIS — K449 Diaphragmatic hernia without obstruction or gangrene: Secondary | ICD-10-CM | POA: Diagnosis not present

## 2019-02-07 DIAGNOSIS — K219 Gastro-esophageal reflux disease without esophagitis: Secondary | ICD-10-CM | POA: Insufficient documentation

## 2019-02-07 DIAGNOSIS — I70229 Atherosclerosis of native arteries of extremities with rest pain, unspecified extremity: Secondary | ICD-10-CM

## 2019-02-07 DIAGNOSIS — S25111S Minor laceration of right innominate or subclavian artery, sequela: Secondary | ICD-10-CM | POA: Diagnosis not present

## 2019-02-07 DIAGNOSIS — I10 Essential (primary) hypertension: Secondary | ICD-10-CM | POA: Diagnosis not present

## 2019-02-07 DIAGNOSIS — E0590 Thyrotoxicosis, unspecified without thyrotoxic crisis or storm: Secondary | ICD-10-CM | POA: Diagnosis not present

## 2019-02-07 DIAGNOSIS — Z7982 Long term (current) use of aspirin: Secondary | ICD-10-CM | POA: Insufficient documentation

## 2019-02-07 DIAGNOSIS — E079 Disorder of thyroid, unspecified: Secondary | ICD-10-CM | POA: Diagnosis not present

## 2019-02-07 DIAGNOSIS — J449 Chronic obstructive pulmonary disease, unspecified: Secondary | ICD-10-CM | POA: Insufficient documentation

## 2019-02-07 HISTORY — PX: UPPER EXTREMITY ANGIOGRAPHY: CATH118270

## 2019-02-07 SURGERY — UPPER EXTREMITY ANGIOGRAPHY
Anesthesia: General | Site: Arm Upper | Laterality: Right

## 2019-02-07 MED ORDER — VITAMIN C 500 MG PO TABS
250.0000 mg | ORAL_TABLET | Freq: Every day | ORAL | Status: DC
  Administered 2019-02-08 – 2019-02-09 (×2): 250 mg via ORAL
  Filled 2019-02-07 (×2): qty 1

## 2019-02-07 MED ORDER — MIDAZOLAM HCL 2 MG/2ML IJ SOLN
INTRAMUSCULAR | Status: AC
  Filled 2019-02-07: qty 2

## 2019-02-07 MED ORDER — FENTANYL 40 MCG/ML IV SOLN
INTRAVENOUS | Status: DC
  Filled 2019-02-07 (×11): qty 25

## 2019-02-07 MED ORDER — FLUTICASONE PROPIONATE 50 MCG/ACT NA SUSP
2.0000 | Freq: Every day | NASAL | Status: DC
  Filled 2019-02-07: qty 16

## 2019-02-07 MED ORDER — ASPIRIN 81 MG PO CHEW
CHEWABLE_TABLET | ORAL | Status: AC
  Filled 2019-02-07: qty 1

## 2019-02-07 MED ORDER — ASPIRIN EC 81 MG PO TBEC
81.0000 mg | DELAYED_RELEASE_TABLET | Freq: Every day | ORAL | Status: DC
  Administered 2019-02-07 – 2019-02-09 (×3): 81 mg via ORAL
  Filled 2019-02-07 (×2): qty 1

## 2019-02-07 MED ORDER — SUCCINYLCHOLINE CHLORIDE 20 MG/ML IJ SOLN
INTRAMUSCULAR | Status: DC | PRN
  Administered 2019-02-07: 120 mg via INTRAVENOUS

## 2019-02-07 MED ORDER — POLYETHYLENE GLYCOL 3350 17 G PO PACK: 17.0000 g | PACK | Freq: Every day | ORAL | Status: DC | PRN

## 2019-02-07 MED ORDER — ONDANSETRON HCL 4 MG/2ML IJ SOLN: 4.0000 mg | Freq: Once | INTRAMUSCULAR | Status: DC | PRN

## 2019-02-07 MED ORDER — TIOTROPIUM BROMIDE MONOHYDRATE 18 MCG IN CAPS
18.0000 ug | ORAL_CAPSULE | Freq: Every day | RESPIRATORY_TRACT | Status: DC
  Administered 2019-02-08 – 2019-02-09 (×2): 18 ug via RESPIRATORY_TRACT
  Filled 2019-02-07: qty 5

## 2019-02-07 MED ORDER — FENTANYL CITRATE (PF) 100 MCG/2ML IJ SOLN
INTRAMUSCULAR | Status: AC
  Filled 2019-02-07: qty 2

## 2019-02-07 MED ORDER — KETOROLAC TROMETHAMINE 30 MG/ML IJ SOLN
60.0000 mg | INTRAMUSCULAR | Status: AC
  Administered 2019-02-07: 60 mg via INTRAVENOUS

## 2019-02-07 MED ORDER — ADULT MULTIVITAMIN W/MINERALS CH
1.0000 | ORAL_TABLET | Freq: Every day | ORAL | Status: DC
  Administered 2019-02-08 – 2019-02-09 (×2): 1 via ORAL
  Filled 2019-02-07 (×2): qty 1

## 2019-02-07 MED ORDER — SUGAMMADEX SODIUM 200 MG/2ML IV SOLN
INTRAVENOUS | Status: DC | PRN
  Administered 2019-02-07: 167 mg via INTRAVENOUS

## 2019-02-07 MED ORDER — KETOROLAC TROMETHAMINE 30 MG/ML IJ SOLN
30.0000 mg | Freq: Four times a day (QID) | INTRAMUSCULAR | Status: DC | PRN
  Administered 2019-02-08: 30 mg via INTRAVENOUS
  Filled 2019-02-07: qty 1

## 2019-02-07 MED ORDER — LORATADINE 10 MG PO TABS
10.0000 mg | ORAL_TABLET | Freq: Every day | ORAL | Status: DC
  Administered 2019-02-08 – 2019-02-09 (×2): 10 mg via ORAL
  Filled 2019-02-07 (×2): qty 1

## 2019-02-07 MED ORDER — ASPIRIN EC 81 MG PO TBEC
DELAYED_RELEASE_TABLET | ORAL | Status: AC
  Filled 2019-02-07: qty 1

## 2019-02-07 MED ORDER — HYDROMORPHONE HCL 1 MG/ML IJ SOLN
0.5000 mg | INTRAMUSCULAR | Status: DC | PRN
  Administered 2019-02-07 (×3): 0.5 mg via INTRAVENOUS

## 2019-02-07 MED ORDER — CLOPIDOGREL BISULFATE 300 MG PO TABS
300.0000 mg | ORAL_TABLET | ORAL | Status: AC
  Administered 2019-02-07: 300 mg via ORAL

## 2019-02-07 MED ORDER — SODIUM CHLORIDE 0.9 % IV SOLN
INTRAVENOUS | Status: DC
  Administered 2019-02-07 (×2): via INTRAVENOUS

## 2019-02-07 MED ORDER — HYDROMORPHONE HCL 1 MG/ML IJ SOLN
INTRAMUSCULAR | Status: AC
  Administered 2019-02-07: 0.5 mg via INTRAVENOUS
  Filled 2019-02-07: qty 1

## 2019-02-07 MED ORDER — LABETALOL HCL 5 MG/ML IV SOLN: 10.0000 mg | INTRAVENOUS | Status: DC | PRN

## 2019-02-07 MED ORDER — ACETAMINOPHEN 10 MG/ML IV SOLN
INTRAVENOUS | Status: AC
  Administered 2019-02-07: 1000 mg via INTRAVENOUS
  Filled 2019-02-07: qty 100

## 2019-02-07 MED ORDER — DIPHENHYDRAMINE HCL 50 MG/ML IJ SOLN: 12.5000 mg | Freq: Four times a day (QID) | INTRAMUSCULAR | Status: DC | PRN

## 2019-02-07 MED ORDER — ONDANSETRON HCL 4 MG/2ML IJ SOLN: 4.0000 mg | Freq: Four times a day (QID) | INTRAMUSCULAR | Status: DC | PRN

## 2019-02-07 MED ORDER — ACETAMINOPHEN 650 MG RE SUPP: 650.0000 mg | Freq: Four times a day (QID) | RECTAL | Status: DC | PRN

## 2019-02-07 MED ORDER — CEFAZOLIN SODIUM-DEXTROSE 2-4 GM/100ML-% IV SOLN
INTRAVENOUS | Status: AC
  Filled 2019-02-07: qty 100

## 2019-02-07 MED ORDER — LACTATED RINGERS IV SOLN
INTRAVENOUS | Status: DC | PRN
  Administered 2019-02-07: 14:00:00 via INTRAVENOUS

## 2019-02-07 MED ORDER — VITAMIN D 25 MCG (1000 UNIT) PO TABS
1000.0000 [IU] | ORAL_TABLET | Freq: Every day | ORAL | Status: DC
  Administered 2019-02-08 – 2019-02-09 (×2): 1000 [IU] via ORAL
  Filled 2019-02-07 (×2): qty 1

## 2019-02-07 MED ORDER — DOCUSATE SODIUM 100 MG PO CAPS
100.0000 mg | ORAL_CAPSULE | Freq: Two times a day (BID) | ORAL | Status: DC
  Administered 2019-02-07 – 2019-02-09 (×4): 100 mg via ORAL
  Filled 2019-02-07 (×4): qty 1

## 2019-02-07 MED ORDER — SODIUM CHLORIDE 0.9% FLUSH: 3.0000 mL | Freq: Two times a day (BID) | INTRAVENOUS | Status: DC

## 2019-02-07 MED ORDER — HEPARIN SODIUM (PORCINE) 1000 UNIT/ML IJ SOLN
INTRAMUSCULAR | Status: DC | PRN
  Administered 2019-02-07: 5000 [IU] via INTRAVENOUS

## 2019-02-07 MED ORDER — MIDAZOLAM HCL 2 MG/2ML IJ SOLN
INTRAMUSCULAR | Status: DC | PRN
  Administered 2019-02-07: 2 mg via INTRAVENOUS

## 2019-02-07 MED ORDER — PROPOFOL 10 MG/ML IV BOLUS
INTRAVENOUS | Status: DC | PRN
  Administered 2019-02-07: 150 mg via INTRAVENOUS

## 2019-02-07 MED ORDER — SODIUM CHLORIDE 0.9% FLUSH: 9.0000 mL | INTRAVENOUS | Status: DC | PRN

## 2019-02-07 MED ORDER — FENTANYL CITRATE (PF) 100 MCG/2ML IJ SOLN
INTRAMUSCULAR | Status: DC | PRN
  Administered 2019-02-07 (×4): 50 ug via INTRAVENOUS

## 2019-02-07 MED ORDER — HYDRALAZINE HCL 20 MG/ML IJ SOLN: 5.0000 mg | INTRAMUSCULAR | Status: DC | PRN

## 2019-02-07 MED ORDER — MOMETASONE FURO-FORMOTEROL FUM 200-5 MCG/ACT IN AERO
2.0000 | INHALATION_SPRAY | Freq: Two times a day (BID) | RESPIRATORY_TRACT | Status: DC
  Administered 2019-02-07 – 2019-02-09 (×4): 2 via RESPIRATORY_TRACT
  Filled 2019-02-07: qty 8.8

## 2019-02-07 MED ORDER — PHENYLEPHRINE HCL (PRESSORS) 10 MG/ML IV SOLN
INTRAVENOUS | Status: DC | PRN
  Administered 2019-02-07: 100 ug via INTRAVENOUS

## 2019-02-07 MED ORDER — PROPOFOL 10 MG/ML IV BOLUS
INTRAVENOUS | Status: AC
  Filled 2019-02-07: qty 20

## 2019-02-07 MED ORDER — ACETAMINOPHEN 325 MG PO TABS: 650.0000 mg | ORAL_TABLET | Freq: Four times a day (QID) | ORAL | Status: DC | PRN

## 2019-02-07 MED ORDER — FENTANYL CITRATE (PF) 100 MCG/2ML IJ SOLN
50.0000 ug | Freq: Once | INTRAMUSCULAR | Status: AC
  Administered 2019-02-07: 50 ug via INTRAVENOUS

## 2019-02-07 MED ORDER — DEXAMETHASONE SODIUM PHOSPHATE 10 MG/ML IJ SOLN
INTRAMUSCULAR | Status: DC | PRN
  Administered 2019-02-07: 10 mg via INTRAVENOUS

## 2019-02-07 MED ORDER — CLOPIDOGREL BISULFATE 75 MG PO TABS
75.0000 mg | ORAL_TABLET | Freq: Every day | ORAL | Status: DC
  Administered 2019-02-08 – 2019-02-09 (×2): 75 mg via ORAL
  Filled 2019-02-07 (×2): qty 1

## 2019-02-07 MED ORDER — NALOXONE HCL 0.4 MG/ML IJ SOLN: 0.4000 mg | INTRAMUSCULAR | Status: DC | PRN

## 2019-02-07 MED ORDER — SODIUM CHLORIDE 0.9 % IV SOLN: INTRAVENOUS | Status: DC

## 2019-02-07 MED ORDER — CLOPIDOGREL BISULFATE 75 MG PO TABS
ORAL_TABLET | ORAL | Status: AC
  Administered 2019-02-07: 20:00:00 300 mg via ORAL
  Filled 2019-02-07: qty 4

## 2019-02-07 MED ORDER — FENTANYL CITRATE (PF) 100 MCG/2ML IJ SOLN
25.0000 ug | INTRAMUSCULAR | Status: DC | PRN
  Administered 2019-02-07 (×4): 25 ug via INTRAVENOUS

## 2019-02-07 MED ORDER — FENTANYL 40 MCG/ML IV SOLN
INTRAVENOUS | Status: DC
  Administered 2019-02-07 – 2019-02-08 (×2): via INTRAVENOUS
  Administered 2019-02-08: 7.87 mL via INTRAVENOUS
  Filled 2019-02-07 (×11): qty 25
  Filled 2019-02-07: qty 1000

## 2019-02-07 MED ORDER — MORPHINE SULFATE (PF) 4 MG/ML IV SOLN
2.0000 mg | INTRAVENOUS | Status: DC | PRN
  Administered 2019-02-07: 4 mg via INTRAVENOUS

## 2019-02-07 MED ORDER — TRIAMCINOLONE ACETONIDE 55 MCG/ACT NA AERO: 1.0000 | INHALATION_SPRAY | Freq: Every day | NASAL | Status: DC

## 2019-02-07 MED ORDER — ONDANSETRON HCL 4 MG/2ML IJ SOLN
INTRAMUSCULAR | Status: DC | PRN
  Administered 2019-02-07: 4 mg via INTRAVENOUS

## 2019-02-07 MED ORDER — OXYCODONE HCL 5 MG PO TABS: 5.0000 mg | ORAL_TABLET | ORAL | Status: DC | PRN

## 2019-02-07 MED ORDER — FENTANYL CITRATE (PF) 100 MCG/2ML IJ SOLN
INTRAMUSCULAR | Status: AC
  Administered 2019-02-07: 25 ug via INTRAVENOUS
  Filled 2019-02-07: qty 2

## 2019-02-07 MED ORDER — LISINOPRIL-HYDROCHLOROTHIAZIDE 20-25 MG PO TABS: 1.0000 | ORAL_TABLET | Freq: Every day | ORAL | Status: DC

## 2019-02-07 MED ORDER — HYDROMORPHONE HCL 1 MG/ML IJ SOLN
0.5000 mg | INTRAMUSCULAR | Status: DC | PRN
  Administered 2019-02-07: 0.5 mg via INTRAVENOUS

## 2019-02-07 MED ORDER — ONDANSETRON HCL 4 MG PO TABS: 4.0000 mg | ORAL_TABLET | Freq: Four times a day (QID) | ORAL | Status: DC | PRN

## 2019-02-07 MED ORDER — CLOPIDOGREL BISULFATE 75 MG PO TABS: 75.0000 mg | ORAL_TABLET | Freq: Every day | ORAL | 11 refills | Status: DC

## 2019-02-07 MED ORDER — SODIUM CHLORIDE 0.9% FLUSH: 3.0000 mL | INTRAVENOUS | Status: DC | PRN

## 2019-02-07 MED ORDER — SODIUM CHLORIDE 0.9 % IV SOLN
INTRAVENOUS | Status: DC
  Administered 2019-02-07: 20:00:00 via INTRAVENOUS

## 2019-02-07 MED ORDER — KETOROLAC TROMETHAMINE 60 MG/2ML IM SOLN
INTRAMUSCULAR | Status: AC
  Filled 2019-02-07: qty 2

## 2019-02-07 MED ORDER — VITAMIN E 45 MG (100 UNIT) PO CAPS
100.0000 [IU] | ORAL_CAPSULE | Freq: Every day | ORAL | Status: DC
  Administered 2019-02-08 – 2019-02-09 (×2): 100 [IU] via ORAL
  Filled 2019-02-07 (×3): qty 1

## 2019-02-07 MED ORDER — ROCURONIUM BROMIDE 100 MG/10ML IV SOLN
INTRAVENOUS | Status: DC | PRN
  Administered 2019-02-07: 20 mg via INTRAVENOUS
  Administered 2019-02-07: 10 mg via INTRAVENOUS
  Administered 2019-02-07: 40 mg via INTRAVENOUS

## 2019-02-07 MED ORDER — DIPHENHYDRAMINE HCL 12.5 MG/5ML PO ELIX
12.5000 mg | ORAL_SOLUTION | Freq: Four times a day (QID) | ORAL | Status: DC | PRN
  Filled 2019-02-07: qty 5

## 2019-02-07 MED ORDER — ACETAMINOPHEN 10 MG/ML IV SOLN
1000.0000 mg | Freq: Four times a day (QID) | INTRAVENOUS | Status: AC
  Administered 2019-02-07 – 2019-02-08 (×4): 1000 mg via INTRAVENOUS
  Filled 2019-02-07 (×3): qty 100

## 2019-02-07 MED ORDER — SODIUM CHLORIDE 0.9 % IV SOLN: 250.0000 mL | INTRAVENOUS | Status: DC | PRN

## 2019-02-07 MED ORDER — LISINOPRIL 20 MG PO TABS
20.0000 mg | ORAL_TABLET | Freq: Every day | ORAL | Status: DC
  Administered 2019-02-08 – 2019-02-09 (×2): 20 mg via ORAL
  Filled 2019-02-07 (×3): qty 1

## 2019-02-07 MED ORDER — ALBUTEROL SULFATE (2.5 MG/3ML) 0.083% IN NEBU: 2.5000 mg | INHALATION_SOLUTION | Freq: Four times a day (QID) | RESPIRATORY_TRACT | Status: DC | PRN

## 2019-02-07 MED ORDER — ACETAMINOPHEN 325 MG PO TABS: 650.0000 mg | ORAL_TABLET | ORAL | Status: DC | PRN

## 2019-02-07 MED ORDER — MORPHINE SULFATE (PF) 2 MG/ML IV SOLN
INTRAVENOUS | Status: AC
  Filled 2019-02-07: qty 2

## 2019-02-07 MED ORDER — HYDROCHLOROTHIAZIDE 25 MG PO TABS
25.0000 mg | ORAL_TABLET | Freq: Every day | ORAL | Status: DC
  Filled 2019-02-07 (×2): qty 1

## 2019-02-07 MED ORDER — LIDOCAINE HCL (CARDIAC) PF 100 MG/5ML IV SOSY
PREFILLED_SYRINGE | INTRAVENOUS | Status: DC | PRN
  Administered 2019-02-07: 100 mg via INTRAVENOUS

## 2019-02-07 SURGICAL SUPPLY — 42 items
BALLN DORADO 10X40X80 (BALLOONS) ×2
BALLN LUTONIX AV 8X60X75 (BALLOONS) ×2
BALLN LUTONIX AV 9X40X75 (BALLOONS) ×2
BALLN LUTONIX DCB 7X60X130 (BALLOONS) ×2
BALLN ULTRVRSE 5X40X130C (BALLOONS) ×2
BALLN ULTRVRSE PTA 5X40X75C (BALLOONS) ×2
BALLOON DORADO 10X40X80 (BALLOONS) ×1 IMPLANT
BALLOON LUTONIX AV 8X60X75 (BALLOONS) ×1 IMPLANT
BALLOON LUTONIX AV 9X40X75 (BALLOONS) ×1 IMPLANT
BALLOON LUTONIX DCB 7X60X130 (BALLOONS) ×1 IMPLANT
BALLOON ULTRVRSE 5X40X130C (BALLOONS) ×1 IMPLANT
BALLOON ULTRVRSE PTA 5X40X75C (BALLOONS) ×1 IMPLANT
CATH ANGIO 5F 100CM .035 PIG (CATHETERS) ×2 IMPLANT
CATH BEACON 5 .035 100 H1 TIP (CATHETERS) ×2 IMPLANT
CATH BEACON 5 .035 65 KMP TIP (CATHETERS) ×2 IMPLANT
CATH BEACON 5 .038 100 VERT TP (CATHETERS) ×2 IMPLANT
CATH CXI SUPP ST 4FR 90CM (CATHETERS) ×2 IMPLANT
CATH SEEKER .035X150CM (CATHETERS) ×2 IMPLANT
DEVICE ENSNARE  12MMX20MM (VASCULAR PRODUCTS) ×1
DEVICE ENSNARE 12MMX20MM (VASCULAR PRODUCTS) ×1 IMPLANT
DEVICE PRESTO INFLATION (MISCELLANEOUS) ×2 IMPLANT
DEVICE RAD TR BAND REGULAR (VASCULAR PRODUCTS) ×2 IMPLANT
DEVICE SAFEGUARD 24CM (GAUZE/BANDAGES/DRESSINGS) ×2 IMPLANT
DEVICE STARCLOSE SE CLOSURE (Vascular Products) ×2 IMPLANT
DEVICE TORQUE .025-.038 (MISCELLANEOUS) ×2 IMPLANT
DRAPE BRACHIAL (DRAPES) ×2 IMPLANT
GLIDEWIRE ADV .035X260CM (WIRE) ×4 IMPLANT
NEEDLE ENTRY 21GA 7CM ECHOTIP (NEEDLE) ×2 IMPLANT
PACK ANGIOGRAPHY (CUSTOM PROCEDURE TRAY) ×2 IMPLANT
SET INTRO CAPELLA COAXIAL (SET/KITS/TRAYS/PACK) ×2 IMPLANT
SHEATH BRITE TIP 5FRX11 (SHEATH) ×2 IMPLANT
SHEATH BRITE TIP 8FRX11 (SHEATH) ×2 IMPLANT
SHEATH DESTINATION 8FR 90CM (SHEATH) ×2 IMPLANT
SHEATH DESTINIATION 65 8FR (SHEATH) ×2 IMPLANT
SHIELD RADPAD DADD DRAPE 4X9 (MISCELLANEOUS) ×2 IMPLANT
STENT COVERA STRAIGHT 10X60X80 (Permanent Stent) ×2 IMPLANT
STENT VENOVO 14X40X120 (Permanent Stent) ×2 IMPLANT
TUBING CONTRAST HIGH PRESS 72 (TUBING) ×2 IMPLANT
VALVE CHECKFLO PERFORMER (SHEATH) ×2 IMPLANT
WIRE AQUATRACK .035X260CM (WIRE) ×2 IMPLANT
WIRE J 3MM .035X145CM (WIRE) ×2 IMPLANT
WIRE MAGIC TORQUE 315CM (WIRE) ×2 IMPLANT

## 2019-02-07 NOTE — Transfer of Care (Signed)
Immediate Anesthesia Transfer of Care Note  Patient: Christy Mason  Procedure(s) Performed: UPPER EXTREMITY ANGIOGRAPHY (Right Arm Upper)  Patient Location: PACU  Anesthesia Type:General  Level of Consciousness: awake, alert  and oriented  Airway & Oxygen Therapy: Patient Spontanous Breathing and Patient connected to face mask oxygen  Post-op Assessment: Report given to RN and Post -op Vital signs reviewed and stable  Post vital signs: Reviewed and stable  Last Vitals:  Vitals Value Taken Time  BP 135/74 02/07/19 1635  Temp 36.3 C 02/07/19 1635  Pulse 91 02/07/19 1636  Resp 16 02/07/19 1636  SpO2 100 % 02/07/19 1636  Vitals shown include unvalidated device data.  Last Pain:  Vitals:   02/07/19 1022  TempSrc: Oral         Complications: No apparent anesthesia complications
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
## 2019-02-07 NOTE — Progress Notes (Signed)
Dr. Delana Meyer here at bedside, speaking with pt. And evaluating pain situation re: shoulder .

## 2019-02-07 NOTE — Anesthesia Postprocedure Evaluation (Signed)
Anesthesia Post Note  Patient: Christy Mason  Procedure(s) Performed: UPPER EXTREMITY ANGIOGRAPHY (Right Arm Upper)  Patient location during evaluation: PACU Anesthesia Type: General Level of consciousness: awake and alert Pain management: pain level controlled Vital Signs Assessment: post-procedure vital signs reviewed and stable Respiratory status: spontaneous breathing, nonlabored ventilation, respiratory function stable and patient connected to nasal cannula oxygen Cardiovascular status: blood pressure returned to baseline and stable Postop Assessment: no apparent nausea or vomiting Anesthetic complications: no     Last Vitals:  Vitals:   02/07/19 1800 02/07/19 1815  BP: 123/63 120/69  Pulse: 97 (!) 101  Resp: (!) 22 18  Temp:    SpO2: 95% 94%    Last Pain:  Vitals:   02/07/19 1815  TempSrc:   PainSc: 10-Worst pain ever                 Precious Haws Piscitello

## 2019-02-07 NOTE — Op Note (Signed)
[facility] Percutaneous Study/Intervention Procedural Note   Date of Surgery: 02/07/2019  Surgeon(s): Leotis Pain  Assistants:none  Pre-operative Diagnosis:   Post-operative diagnosis: Same  Procedure(s) Performed: 1. Ultrasound guidance for vascular access right radial 2. Catheter placement into right innominate artery from right radial approach 3. Percutaneous transluminal angioplasty of right subclavian artery with 5 mm diameter angioplasty balloon  4.  Assistant to Dr. Delana Meyer performing right subclavian artery stenting x2   EBL: 20 cc  Contrast: 125 cc  Fluoro Time: 35 min  Anesthesia: general  Indications: Patient is a 58 y.o. female with ischemic right hand.  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  This is a very difficult situation with her previously having had a major chest wall reconstruction as well. The patient is brought in for angiography for further evaluation and potential treatment. Risks and benefits are discussed and informed consent is obtained  Procedure: The patient was identified and appropriate procedural time out was performed. The patient was then placed supine on the table and prepped and draped in the usual sterile fashion.  General anesthesia was provided.  Her right arm and her right groin were sterilely prepped and draped.  Dr. Delana Meyer work to the right groin and our through the right arm.  The right radial artery was found to be patent although somewhat small.  It was accessed under direct ultrasound guidance without difficulty with a micropuncture needle and a permanent image was recorded.  A micropuncture wire and sheath were then placed and we upsized to a 5 French slender sheath.  Dr. Delana Meyer came up and  performed imaging of the thoracic aorta and the innominate artery gaining access to the innominate artery.  I then crossed the occlusion in the right subclavian artery with minimal difficulty with a Kumpe catheter and advantage wire and confirmed intraluminal flow in the innominate artery.  Dr. Delana Meyer attempted to cross the area with the sheath but the previous stent and the occlusion would not allow this.  I then performed angioplasty of the right subclavian artery with a 5 mm diameter by 4 cm length angioplasty balloon inflating this to 10 atm for 1 minute.  This allowed Dr. Delana Meyer to cross from his side with the sheath.  To confirm true luminal access on both sides he then snared my wire from the innominate artery and pulled it out the right femoral sheath and we advanced a seeker catheter to hold the access.  He then placed a Magic torque wire up through the femoral sheath.  See his note for full details of his portion but a 10 mm diameter by 4 cm length Covera stent was placed in the proximal axillary artery and more distal subclavian artery.  This was then postdilated with a 10 mm diameter high-pressure angioplasty balloon.  There is still lesion proximal to this to did not respond to an 8 mm diameter Lutonix angioplasty balloon and so a 12 mm diameter by 4 cm length life star stent was deployed in the proximal subclavian artery just past the origin of the carotid artery so that a noncovered stent would not impair flow through the mammary artery or vertebral artery.  This was postdilated with a 9 mm balloon.  After he was finished with his portion from the groin, I removed the wire and the sheath from the right  radial artery and a TR band was placed in the radial artery site.  Dr. Delana Meyer performed a Star close closure device in the right femoral artery.  The patient was taken to the recovery room in stable condition having tolerated the procedure well.     Disposition: Patient was taken to the recovery  room in stable condition having tolerated the procedure well.  Complications: None  Leotis Pain 02/07/2019 4:28 PM     This note was created with Dragon Medical transcription system. Any errors in dictation are purely unintentional.

## 2019-02-07 NOTE — Progress Notes (Signed)
Pt unable to tolerate her pain. Pt given 180mcg Fentanyl with no relief. Dr. Amie Critchley notified. Acknowledged. Orders received.

## 2019-02-07 NOTE — Anesthesia Post-op Follow-up Note (Signed)
Anesthesia QCDR form completed.        

## 2019-02-07 NOTE — Anesthesia Procedure Notes (Signed)
Procedure Name: Intubation Date/Time: 02/07/2019 2:27 PM Performed by: Nelda Marseille, CRNA Pre-anesthesia Checklist: Patient identified, Emergency Drugs available, Suction available, Patient being monitored and Timeout performed Patient Re-evaluated:Patient Re-evaluated prior to induction Oxygen Delivery Method: Circle system utilized Preoxygenation: Pre-oxygenation with 100% oxygen Induction Type: IV induction Ventilation: Mask ventilation without difficulty Laryngoscope Size: Mac, 3 and McGraph Grade View: Grade I Tube type: Oral Tube size: 7.0 mm Number of attempts: 1 Airway Equipment and Method: Stylet Placement Confirmation: ETT inserted through vocal cords under direct vision,  positive ETCO2 and breath sounds checked- equal and bilateral Secured at: 21 cm Tube secured with: Tape Dental Injury: Teeth and Oropharynx as per pre-operative assessment

## 2019-02-07 NOTE — Anesthesia Preprocedure Evaluation (Signed)
Anesthesia Evaluation  Patient identified by MRN, date of birth, ID band Patient awake    Reviewed: Allergy & Precautions, NPO status , Patient's Chart, lab work & pertinent test results  History of Anesthesia Complications (+) PONV and history of anesthetic complications  Airway Mallampati: III       Dental  (+) Upper Dentures, Lower Dentures   Pulmonary asthma , COPD,  COPD inhaler, Current Smoker,           Cardiovascular hypertension, Pt. on medications (-) Past MI and (-) DVT (-) dysrhythmias (-) Valvular Problems/Murmurs     Neuro/Psych neg Seizures Anxiety    GI/Hepatic Neg liver ROS, hiatal hernia, GERD  Poorly Controlled,  Endo/Other  neg diabetesHyperthyroidism (hx, no problems now)   Renal/GU negative Renal ROS     Musculoskeletal   Abdominal   Peds  Hematology   Anesthesia Other Findings   Reproductive/Obstetrics                             Anesthesia Physical Anesthesia Plan  ASA: III  Anesthesia Plan: General   Post-op Pain Management:    Induction: Intravenous  PONV Risk Score and Plan: 3 and Ondansetron, Dexamethasone and Midazolam  Airway Management Planned: Oral ETT  Additional Equipment:   Intra-op Plan:   Post-operative Plan:   Informed Consent: I have reviewed the patients History and Physical, chart, labs and discussed the procedure including the risks, benefits and alternatives for the proposed anesthesia with the patient or authorized representative who has indicated his/her understanding and acceptance.       Plan Discussed with:   Anesthesia Plan Comments:         Anesthesia Quick Evaluation

## 2019-02-07 NOTE — Progress Notes (Signed)
Spoke with Dr. Delana Meyer re: pt. C/o pain "10/10" upon receiving pt. From PACU.. MD states "she is on suboxone, so let me call pharmacy and see what we can give her instead of the dilaudid and morphine."

## 2019-02-08 ENCOUNTER — Encounter: Payer: Self-pay | Admitting: Vascular Surgery

## 2019-02-08 DIAGNOSIS — I70228 Atherosclerosis of native arteries of extremities with rest pain, other extremity: Secondary | ICD-10-CM | POA: Diagnosis not present

## 2019-02-08 DIAGNOSIS — M79601 Pain in right arm: Secondary | ICD-10-CM

## 2019-02-08 LAB — CBC
HCT: 33.2 % — ABNORMAL LOW (ref 36.0–46.0)
Hemoglobin: 10.7 g/dL — ABNORMAL LOW (ref 12.0–15.0)
MCH: 31.3 pg (ref 26.0–34.0)
MCHC: 32.2 g/dL (ref 30.0–36.0)
MCV: 97.1 fL (ref 80.0–100.0)
Platelets: 195 10*3/uL (ref 150–400)
RBC: 3.42 MIL/uL — ABNORMAL LOW (ref 3.87–5.11)
RDW: 13.4 % (ref 11.5–15.5)
WBC: 13.9 10*3/uL — ABNORMAL HIGH (ref 4.0–10.5)
nRBC: 0 % (ref 0.0–0.2)

## 2019-02-08 LAB — BASIC METABOLIC PANEL
Anion gap: 9 (ref 5–15)
BUN: 11 mg/dL (ref 6–20)
CO2: 23 mmol/L (ref 22–32)
Calcium: 8.2 mg/dL — ABNORMAL LOW (ref 8.9–10.3)
Chloride: 106 mmol/L (ref 98–111)
Creatinine, Ser: 0.85 mg/dL (ref 0.44–1.00)
GFR calc Af Amer: >60 mL/min (ref >60)
GFR calc non Af Amer: >60 mL/min (ref >60)
Glucose, Bld: 167 mg/dL — ABNORMAL HIGH (ref 70–99)
Potassium: 4.7 mmol/L (ref 3.5–5.1)
Sodium: 138 mmol/L (ref 135–145)

## 2019-02-08 MED ORDER — ACETAMINOPHEN 10 MG/ML IV SOLN
1000.0000 mg | Freq: Four times a day (QID) | INTRAVENOUS | Status: AC
  Administered 2019-02-08 – 2019-02-09 (×4): 1000 mg via INTRAVENOUS
  Filled 2019-02-08 (×4): qty 100

## 2019-02-08 MED ORDER — OXYCODONE HCL 5 MG PO TABS
5.0000 mg | ORAL_TABLET | ORAL | Status: DC | PRN
  Administered 2019-02-09: 5 mg via ORAL
  Filled 2019-02-08: qty 1

## 2019-02-08 MED ORDER — NICOTINE 21 MG/24HR TD PT24
21.0000 mg | MEDICATED_PATCH | Freq: Every day | TRANSDERMAL | Status: DC
  Administered 2019-02-08 – 2019-02-09 (×2): 21 mg via TRANSDERMAL
  Filled 2019-02-08 (×2): qty 1

## 2019-02-08 MED ORDER — SODIUM CHLORIDE 0.9 % IV SOLN: INTRAVENOUS | Status: DC

## 2019-02-08 MED ORDER — HYDROMORPHONE HCL 1 MG/ML IJ SOLN
1.0000 mg | INTRAMUSCULAR | Status: AC | PRN
  Administered 2019-02-08: 1 mg via INTRAVENOUS
  Filled 2019-02-08: qty 1

## 2019-02-08 MED ORDER — KETOROLAC TROMETHAMINE 30 MG/ML IJ SOLN
30.0000 mg | Freq: Four times a day (QID) | INTRAMUSCULAR | Status: DC
  Administered 2019-02-08 – 2019-02-09 (×5): 30 mg via INTRAVENOUS
  Filled 2019-02-08 (×5): qty 1

## 2019-02-08 MED ORDER — OXYCODONE HCL 5 MG PO TABS
10.0000 mg | ORAL_TABLET | ORAL | Status: DC | PRN
  Administered 2019-02-08 – 2019-02-09 (×4): 10 mg via ORAL
  Filled 2019-02-08 (×4): qty 2

## 2019-02-08 MED FILL — Fentanyl Citrate Preservative Free (PF) Inj 1000 MCG/20ML: INTRAMUSCULAR | Qty: 20 | Status: AC

## 2019-02-08 MED FILL — Sodium Chloride IV Soln 0.9%: INTRAVENOUS | Qty: 50 | Status: AC

## 2019-02-08 NOTE — Progress Notes (Signed)
Physical Therapy Evaluation Patient Details Name: Christy Mason MRN: [id number] DOB: 1960-10-04 Today's Date: 02/08/2019   History of Present Illness  The patient is a 58 year old female status post a right upper extremity endovascular intervention postop day #1  Clinical Impression  Patient reports that she has been up walking to the bathroom already. She is independent with bed mobility, transfers and gait with decreased gait speed and no device.She has no deficits in standing balance. She has shortness of breath but O2 saturation is 100 percent. She has no LE gait deficits and no PT needs at this time. She would benefit from an OT evaluation.      Follow Up Recommendations No PT follow up    Equipment Recommendations  None recommended by PT    Recommendations for Other Services OT consult     Precautions / Restrictions Restrictions Weight Bearing Restrictions: No      Mobility  Bed Mobility Overal bed mobility: Independent             General bed mobility comments: no VC needed  Transfers Overall transfer level: Independent Equipment used: None             General transfer comment: no cues needed  Ambulation/Gait Ambulation/Gait assistance: Independent Gait Distance (Feet): 300 Feet Assistive device: None Gait Pattern/deviations: Step-through pattern     General Gait Details: (decreased gait speed , short of breath, O2 sat is 100 percen)  Stairs            Wheelchair Mobility    Modified Rankin (Stroke Patients Only)       Balance Overall balance assessment: No apparent balance deficits (not formally assessed)                                           Pertinent Vitals/Pain Pain Assessment: 0-10 Pain Score: 6  Pain Location: (right arm) Pain Descriptors / Indicators: Aching Pain Intervention(s): Limited activity within patient's tolerance;Monitored during session    Home Living Family/patient expects to be  discharged to:: Private residence Living Arrangements: Spouse/significant other Available Help at Discharge: Family Type of Home: House Home Access: Stairs to enter Entrance Stairs-Rails: Right Entrance Stairs-Number of Steps: 2   Home Equipment: None      Prior Function Level of Independence: Independent         Comments: (unable to wash her hair)     Hand Dominance        Extremity/Trunk Assessment   Upper Extremity Assessment Upper Extremity Assessment: Defer to OT evaluation    Lower Extremity Assessment Lower Extremity Assessment: Overall WFL for tasks assessed       Communication   Communication: No difficulties  Cognition Arousal/Alertness: Awake/alert Behavior During Therapy: WFL for tasks assessed/performed Overall Cognitive Status: Within Functional Limits for tasks assessed                                        General Comments      Exercises     Assessment/Plan    PT Assessment Patent does not need any further PT services  PT Problem List         PT Treatment Interventions      PT Goals (Current goals can be found in the Care Plan section)  Acute Rehab PT  Goals Patient Stated Goal: (to go home) PT Goal Formulation: With patient Time For Goal Achievement: 02/15/19 Potential to Achieve Goals: Good    Frequency     Barriers to discharge        Co-evaluation               AM-PAC PT "6 Clicks" Mobility  Outcome Measure Help needed turning from your back to your side while in a flat bed without using bedrails?: None Help needed moving from lying on your back to sitting on the side of a flat bed without using bedrails?: None Help needed moving to and from a bed to a chair (including a wheelchair)?: None Help needed standing up from a chair using your arms (e.g., wheelchair or bedside chair)?: None Help needed to walk in hospital room?: None Help needed climbing 3-5 steps with a railing? : None 6 Click Score:  24    End of Session Equipment Utilized During Treatment: Gait belt Activity Tolerance: Patient tolerated treatment well;No increased pain;Patient limited by fatigue Patient left: in bed   PT Visit Diagnosis: Muscle weakness (generalized) (M62.81)    Time: [id number] PT Time Calculation (min) (ACUTE ONLY): 20 min   Charges:   PT Evaluation $PT Eval Low Complexity: 1 Low            ClinchcoMansfield, Sharion SettlerKristine S, PT DPT 02/08/2019, 5:31 PM

## 2019-02-08 NOTE — Progress Notes (Signed)
Patient complaining of pain in her neck and right shoulder. Requesting nicotine patch. Order received from Dr Delana Meyer for kpad and nicotine patch

## 2019-02-08 NOTE — Progress Notes (Signed)
[facility] Daily Progress Note   Subjective: 1 Day Post-Op:             1.  Ultrasound-guided access to the right common femoral             2.  Catheter placement from the right femoral into the distal right subclavian             3.  Percutaneous transluminal angioplasty and stent placement right subclavian             4.  Percutaneous transluminal angioplasty and additional stent placement right subclavian             5.  Assistant to catheter placement right innominate from the radial artery approach             6.  Assistant to angioplasty right subclavian with a 5 mm balloon             7.  Star close right common femoral  Patient still complaining of left upper extremity shoulder pain status post endovascular intervention yesterday.  Patient was placed on a PCA overnight for pain control.    Objective: Vitals:   02/08/19 0754 02/08/19 0930 02/08/19 1101 02/08/19 1116  BP: 122/65  (!) 149/99   Pulse: 68  76   Resp:  19 (!) 24 (!) 24  Temp:   99.1 F (37.3 C)   TempSrc:   Oral   SpO2: 99% 96% 100% 98%  Weight:      Height:        Intake/Output Summary (Last 24 hours) at 02/08/2019 1126 Last data filed at 02/08/2019 1050 Gross per 24 hour  Intake 2697.83 ml  Output 575 ml  Net 2122.83 ml   Physical Exam: A&Ox3, NAD CV: RRR Pulmonary: CTA Bilaterally Abdomen: Soft, Nontender, Nondistended Groin: Ecchymosis noted.  No swelling or drainage noted. Vascular:  Right Upper Extremity: Extremity is soft.  Some ecchymosis noted at the wrist access.  Faint radial pulse.  Hand is warm.  Motor/sensory is intact.  Laboratory: CBC    Component Value Date/Time   WBC 13.9 (H) 02/08/2019 0526   HGB 10.7 (L) 02/08/2019 0526   HGB 13.2 12/13/2018 1552   HCT 33.2 (L) 02/08/2019 0526   HCT 37.6 12/13/2018 1552   PLT 195 02/08/2019 0526   PLT 235 12/13/2018 1552   BMET    Component Value Date/Time   NA 138 02/08/2019 0526   NA 141 12/13/2018 1552   NA  136 03/19/2014 1818   K 4.7 02/08/2019 0526   K 3.0 (L) 03/19/2014 1818   CL 106 02/08/2019 0526   CL 101 03/19/2014 1818   CO2 23 02/08/2019 0526   CO2 24 03/19/2014 1818   GLUCOSE 167 (H) 02/08/2019 0526   GLUCOSE 139 (H) 03/19/2014 1818   BUN 11 02/08/2019 0526   BUN 8 12/13/2018 1552   BUN 10 03/19/2014 1818   CREATININE 0.85 02/08/2019 0526   CREATININE 0.86 03/19/2014 1818   CALCIUM 8.2 (L) 02/08/2019 0526   CALCIUM 10.4 (H) 03/19/2014 1818   GFRNONAA >60 02/08/2019 0526   GFRNONAA >60 03/19/2014 1818   GFRAA >60 02/08/2019 0526   GFRAA >60 03/19/2014 1818   Assessment/Planning: The patient is a [age] year old female status post a right upper extremity endovascular intervention postop day #1 1) we will transition from a PCA.  The patient was on Suboxone so treating her discomfort will be challenging.  Will transition to oxycodone/Dilaudid. 2)  continuing Toradol/acetaminophen 3) encouraged warm compresses to the areas of discomfort 4) order physical therapy as patient has not gotten out of bed.  Encouraged out of bed to chair and walking today. 5) once patient's pain is under control we will plan discharge home  Discussed with Dr. Eber Hong [facility] PA-C 02/08/2019 11:26 AM

## 2019-02-09 ENCOUNTER — Other Ambulatory Visit: Payer: Self-pay | Admitting: Physician Assistant

## 2019-02-09 DIAGNOSIS — M79601 Pain in right arm: Secondary | ICD-10-CM | POA: Diagnosis not present

## 2019-02-09 DIAGNOSIS — I70228 Atherosclerosis of native arteries of extremities with rest pain, other extremity: Secondary | ICD-10-CM | POA: Diagnosis not present

## 2019-02-09 DIAGNOSIS — J449 Chronic obstructive pulmonary disease, unspecified: Secondary | ICD-10-CM

## 2019-02-09 LAB — BASIC METABOLIC PANEL
Anion gap: 10 (ref 5–15)
BUN: 14 mg/dL (ref 6–20)
CO2: 22 mmol/L (ref 22–32)
Calcium: 8.6 mg/dL — ABNORMAL LOW (ref 8.9–10.3)
Chloride: 106 mmol/L (ref 98–111)
Creatinine, Ser: 0.77 mg/dL (ref 0.44–1.00)
GFR calc Af Amer: >60 mL/min (ref >60)
GFR calc non Af Amer: >60 mL/min (ref >60)
Glucose, Bld: 97 mg/dL (ref 70–99)
Potassium: 4 mmol/L (ref 3.5–5.1)
Sodium: 138 mmol/L (ref 135–145)

## 2019-02-09 LAB — CBC
HCT: 29.3 % — ABNORMAL LOW (ref 36.0–46.0)
Hemoglobin: 9.8 g/dL — ABNORMAL LOW (ref 12.0–15.0)
MCH: 31.2 pg (ref 26.0–34.0)
MCHC: 33.4 g/dL (ref 30.0–36.0)
MCV: 93.3 fL (ref 80.0–100.0)
Platelets: 169 10*3/uL (ref 150–400)
RBC: 3.14 MIL/uL — ABNORMAL LOW (ref 3.87–5.11)
RDW: 13.6 % (ref 11.5–15.5)
WBC: 16.4 10*3/uL — ABNORMAL HIGH (ref 4.0–10.5)
nRBC: 0 % (ref 0.0–0.2)

## 2019-02-09 LAB — MAGNESIUM: Magnesium: 2 mg/dL (ref 1.7–2.4)

## 2019-02-09 MED ORDER — OXYCODONE HCL 5 MG PO TABS: ORAL_TABLET | ORAL | 0 refills | Status: DC

## 2019-02-09 NOTE — Discharge Summary (Signed)
[facility]    Discharge Summary  Patient ID:  Christy BlalockSherry M Mason MRN: [id number] DOB/AGE: 1961/06/20 58 y.o.  Admit date: 02/07/2019 Discharge date: 02/09/2019 Date of Surgery: 02/07/2019 Surgeon: Surgeon(s): Schnier, Latina CraverGregory G, MD Annice Needyew, Jason S, MD  Admission Diagnosis: RT arm angio w RT femoral approach    ANESTHESIA    ASO w rest pain  Dr Gilda CreaseSchnier w Dr Wyn Quakerew to assist    Covid 7 24 per Vernona RiegerLaura  Discharge Diagnoses:  RT arm angio w RT femoral approach    ANESTHESIA    ASO w rest pain  Dr Gilda CreaseSchnier w Dr Wyn Quakerew to assist    Covid 7 24 per Vernona RiegerLaura  Secondary Diagnoses: Past Medical History:  Diagnosis Date  . Allergy   . Anxiety   . Arthritis   . Asthma   . Complication of anesthesia   . COPD (chronic obstructive pulmonary disease) (HCC)   . Emphysema of lung (HCC)   . GERD (gastroesophageal reflux disease)   . History of hiatal hernia   . Hypertension   . Hyperthyroidism    h/o  . Neuromuscular disorder (HCC)   . PONV (postoperative nausea and vomiting)   . Thyroid disease    Procedure(s): UPPER EXTREMITY ANGIOGRAPHY  Discharged Condition: Good  HPI / Hospital Course:  The patient presented to the office for worsening right arm pain.  She stated her right arm is practically useless she cannot even comb her hair because of the pain and weakness associated with her arm.  Noninvasive studies and physical examination demonstrated significant arterial disease.  Subsequently an angiogram was performed which demonstrated a greater than 70% stenosis in the proximal subclavian and a second distinct lesion in the mid subclavian where the previously placed via bond stent was occluded.  Attempts at crossing the lesion from the femoral approach were not successful and she is now being returned to the operating room where Dr. Wyn Quakerew will be working from the patient's wrist in a retrograde approach and Dr. Gilda CreaseSchnier will be working from the femoral and an antegrade approach.  On  02/07/19 the patient underwent a:             1.  Ultrasound-guided access to the right common femoral             2.  Catheter placement from the right femoral into the distal right subclavian             3.  Percutaneous transluminal angioplasty and stent placement right subclavian             4.  Percutaneous transluminal angioplasty and additional stent placement right subclavian             5.  Assistant to catheter placement right innominate from the radial artery approach             6.  Assistant to angioplasty right subclavian with a 5 mm balloon             7.  Star close right common femoral  The patient tolerated the procedure well was transferred from the recovery room to the surgical floor without issue.  The patient was kept overnight and for postop day 1 for pain control.  The patient does have a past medical history of narcotic abuse and has been treated with Suboxone.  During the patient's brief inpatient stay her diet was advanced, her pain was controlled to the use of p.o./IV pain medication, she was urinating  without issue and she was ambulating independently.  Upon discharge, patient was afebrile with vital stable signs and her physical exam was unremarkable.  Physical exam:  A&Ox3, NAD CV: RRR Pulm: CTA Bilaterally Abdomen: soft, non-tender, non-distended, (+) bowel sounds Right Groin: Some ecchymosis noted at access site Right Upper Extremity:   Warm, 2+ radial pulse, some ecchymosis at wrist access site  Labs as below  Complications: None  Consults: None  Significant Diagnostic Studies: CBC Lab Results  Component Value Date   WBC 16.4 (H) 02/09/2019   HGB 9.8 (L) 02/09/2019   HCT 29.3 (L) 02/09/2019   MCV 93.3 02/09/2019   PLT 169 02/09/2019   BMET    Component Value Date/Time   NA 138 02/09/2019 0634   NA 141 12/13/2018 1552   NA 136 03/19/2014 1818   K 4.0 02/09/2019 0634   K 3.0 (L) 03/19/2014 1818   CL 106 02/09/2019 0634   CL 101 03/19/2014  1818   CO2 22 02/09/2019 0634   CO2 24 03/19/2014 1818   GLUCOSE 97 02/09/2019 0634   GLUCOSE 139 (H) 03/19/2014 1818   BUN 14 02/09/2019 0634   BUN 8 12/13/2018 1552   BUN 10 03/19/2014 1818   CREATININE 0.77 02/09/2019 0634   CREATININE 0.86 03/19/2014 1818   CALCIUM 8.6 (L) 02/09/2019 0634   CALCIUM 10.4 (H) 03/19/2014 1818   GFRNONAA >60 02/09/2019 0634   GFRNONAA >60 03/19/2014 1818   GFRAA >60 02/09/2019 0634   GFRAA >60 03/19/2014 1818   COAG No results found for: INR, PROTIME  Disposition:  Discharge to :Home Discharge Instructions    Call MD for:  redness, tenderness, or signs of infection (pain, swelling, bleeding, redness, odor or green/yellow discharge around incision site)   Complete by: As directed    Call MD for:  severe or increased pain, loss or decreased feeling  in affected limb(s)   Complete by: As directed    Call MD for:  temperature >100.5   Complete by: As directed    Discharge instructions   Complete by: As directed    Okay to shower after 36 hours.  Remove bandages from right wrist and right groin before showering and replace with Band-Aids daily as needed.   Driving Restrictions   Complete by: As directed    No driving for 48 hours   Lifting restrictions   Complete by: As directed    No lifting for 1 week   No dressing needed   Complete by: As directed    Replace only if drainage present   Resume previous diet   Complete by: As directed      Allergies as of 02/09/2019      Reactions   Nsaids Other (See Comments)   irritates stomach severley    Other    Steroids-muscle weakness and headache   Sular [nisoldipine Er] Palpitations      Medication List    TAKE these medications   Advair Diskus 250-50 MCG/DOSE Aepb Generic drug: Fluticasone-Salmeterol Inhale 1 puff into the lungs 2 (two) times a day. INL 1 PUFF PO BID   aspirin EC 81 MG tablet Take 81 mg by mouth daily.   cetirizine 10 MG tablet Commonly known as: ZYRTEC Take 10  mg by mouth daily.   cholecalciferol 25 MCG (1000 UT) tablet Commonly known as: VITAMIN D3 Take 1,000 Units by mouth daily.   clopidogrel 75 MG tablet Commonly known as: Plavix Take 1 tablet (75 mg total) by mouth daily.  lisinopril-hydrochlorothiazide 20-25 MG tablet Commonly known as: ZESTORETIC TK 1 T PO QD What changed:   how much to take  how to take this  when to take this  additional instructions   lubiprostone 24 MCG capsule Commonly known as: Amitiza Take 1 capsule (24 mcg total) by mouth 2 (two) times daily with a meal. What changed:   when to take this  reasons to take this   multivitamin with minerals Tabs tablet Take 1 tablet by mouth daily.   Nasacort Allergy 24HR 55 MCG/ACT Aero nasal inhaler Generic drug: triamcinolone Place 1 spray into the nose daily.   oxyCODONE 5 MG immediate release tablet Commonly known as: Oxy IR/ROXICODONE One to Two Tabs Every Six Hours As Needed For Pain   ProAir HFA 108 (90 Base) MCG/ACT inhaler Generic drug: albuterol INHALE 2 PUFFS INTO THE LUNGS EVERY 6 HOURS AS NEEDED FOR WHEEZING OR SHORTNESS OF BREATH What changed: See the new instructions.   Spiriva HandiHaler 18 MCG inhalation capsule Generic drug: tiotropium Place 1 capsule (18 mcg total) into inhaler and inhale daily.   Suboxone 8-2 MG Film Generic drug: Buprenorphine HCl-Naloxone HCl Take 0.5 Film by mouth See admin instructions. Take 0.5 film by mouth scheduled in the morning, may take other 0.5 film if needed for pain.   vitamin C 100 MG tablet Take 100 mg by mouth daily.   vitamin E 100 UNIT capsule Take 100 Units by mouth daily. Pt currently out of vitamin E as of 02-02-19      Verbal and written Discharge instructions given to the patient. Wound care per Discharge AVS Follow-up Information    Schnier, Dolores Lory, MD. Go on 02/26/2019.   Specialties: Vascular Surgery, Cardiology, Radiology, Vascular Surgery Why: @1 :30pm Contact  information: [place] [address] [phone]          Signed: Sela Hua, PA-C  02/09/2019, 12:48 PM

## 2019-02-09 NOTE — Progress Notes (Signed)
Christy Mason to be D/C'd home per MD order.  Discussed prescriptions and follow up appointments with the patient. Prescriptions given to patient, medication list explained in detail. Pt verbalized understanding.  Allergies as of 02/09/2019      Reactions   Nsaids Other (See Comments)   irritates stomach severley    Other    Steroids-muscle weakness and headache   Sular [nisoldipine Er] Palpitations      Medication List    TAKE these medications   Advair Diskus 250-50 MCG/DOSE Aepb Generic drug: Fluticasone-Salmeterol INL 1 PUFF PO BID What changed:   how much to take  how to take this  when to take this   aspirin EC 81 MG tablet Take 81 mg by mouth daily.   cetirizine 10 MG tablet Commonly known as: ZYRTEC Take 10 mg by mouth daily.   cholecalciferol 25 MCG (1000 UT) tablet Commonly known as: VITAMIN D3 Take 1,000 Units by mouth daily.   clopidogrel 75 MG tablet Commonly known as: Plavix Take 1 tablet (75 mg total) by mouth daily.   lisinopril-hydrochlorothiazide 20-25 MG tablet Commonly known as: ZESTORETIC TK 1 T PO QD What changed:   how much to take  how to take this  when to take this  additional instructions   lubiprostone 24 MCG capsule Commonly known as: Amitiza Take 1 capsule (24 mcg total) by mouth 2 (two) times daily with a meal. What changed:   when to take this  reasons to take this   multivitamin with minerals Tabs tablet Take 1 tablet by mouth daily.   Nasacort Allergy 24HR 55 MCG/ACT Aero nasal inhaler Generic drug: triamcinolone Place 1 spray into the nose daily.   oxyCODONE 5 MG immediate release tablet Commonly known as: Oxy IR/ROXICODONE One to Two Tabs Every Six Hours As Needed For Pain   ProAir HFA 108 (90 Base) MCG/ACT inhaler Generic drug: albuterol Inhale 2 puffs into the lungs every 6 (six) hours as needed for wheezing or shortness of breath.   Spiriva HandiHaler 18 MCG inhalation capsule Generic drug:  tiotropium Place 1 capsule (18 mcg total) into inhaler and inhale daily.   Suboxone 8-2 MG Film Generic drug: Buprenorphine HCl-Naloxone HCl Take 0.5 Film by mouth See admin instructions. Take 0.5 film by mouth scheduled in the morning, may take other 0.5 film if needed for pain.   vitamin C 100 MG tablet Take 100 mg by mouth daily.   vitamin E 100 UNIT capsule Take 100 Units by mouth daily. Pt currently out of vitamin E as of 02-02-19       Vitals:   02/08/19 2012 02/09/19 0447  BP: 109/62 123/74  Pulse: 77 71  Resp: 20 20  Temp: 98.5 F (36.9 C) 97.9 F (36.6 C)  SpO2: 95% 94%    Skin clean, dry and intact without evidence of skin break down, no evidence of skin tears noted. IV catheter discontinued intact. Site without signs and symptoms of complications. Dressing and pressure applied. Pt denies pain at this time. No complaints noted.  An After Visit Summary was printed and given to the patient. Patient escorted via [place], and D/C home via private auto.  Chuck Hint RN [facility] [employer]

## 2019-02-12 ENCOUNTER — Emergency Department: Payer: Medicaid Other

## 2019-02-12 ENCOUNTER — Inpatient Hospital Stay
Admission: EM | Admit: 2019-02-12 | Discharge: 2019-02-14 | DRG: 301 | Disposition: A | Discharge status: Home / Self Care | Payer: Medicaid Other | Attending: Vascular Surgery | Admitting: Vascular Surgery

## 2019-02-12 ENCOUNTER — Other Ambulatory Visit: Payer: Self-pay

## 2019-02-12 ENCOUNTER — Telehealth (INDEPENDENT_AMBULATORY_CARE_PROVIDER_SITE_OTHER): Payer: Self-pay | Admitting: Vascular Surgery

## 2019-02-12 ENCOUNTER — Encounter: Payer: Self-pay | Admitting: Emergency Medicine

## 2019-02-12 DIAGNOSIS — F17210 Nicotine dependence, cigarettes, uncomplicated: Secondary | ICD-10-CM | POA: Diagnosis present

## 2019-02-12 DIAGNOSIS — Z8249 Family history of ischemic heart disease and other diseases of the circulatory system: Secondary | ICD-10-CM

## 2019-02-12 DIAGNOSIS — I7770 Dissection of unspecified artery: Secondary | ICD-10-CM

## 2019-02-12 DIAGNOSIS — Z7951 Long term (current) use of inhaled steroids: Secondary | ICD-10-CM

## 2019-02-12 DIAGNOSIS — R509 Fever, unspecified: Secondary | ICD-10-CM | POA: Diagnosis not present

## 2019-02-12 DIAGNOSIS — M1990 Unspecified osteoarthritis, unspecified site: Secondary | ICD-10-CM | POA: Diagnosis present

## 2019-02-12 DIAGNOSIS — I10 Essential (primary) hypertension: Secondary | ICD-10-CM | POA: Diagnosis not present

## 2019-02-12 DIAGNOSIS — Z888 Allergy status to other drugs, medicaments and biological substances status: Secondary | ICD-10-CM | POA: Diagnosis not present

## 2019-02-12 DIAGNOSIS — I7776 Dissection of artery of upper extremity: Principal | ICD-10-CM | POA: Diagnosis present

## 2019-02-12 DIAGNOSIS — Z7902 Long term (current) use of antithrombotics/antiplatelets: Secondary | ICD-10-CM | POA: Diagnosis not present

## 2019-02-12 DIAGNOSIS — Z95820 Peripheral vascular angioplasty status with implants and grafts: Secondary | ICD-10-CM | POA: Diagnosis not present

## 2019-02-12 DIAGNOSIS — Z886 Allergy status to analgesic agent status: Secondary | ICD-10-CM | POA: Diagnosis not present

## 2019-02-12 DIAGNOSIS — K219 Gastro-esophageal reflux disease without esophagitis: Secondary | ICD-10-CM | POA: Diagnosis present

## 2019-02-12 DIAGNOSIS — E0590 Thyrotoxicosis, unspecified without thyrotoxic crisis or storm: Secondary | ICD-10-CM | POA: Diagnosis present

## 2019-02-12 DIAGNOSIS — Z825 Family history of asthma and other chronic lower respiratory diseases: Secondary | ICD-10-CM

## 2019-02-12 DIAGNOSIS — Z20828 Contact with and (suspected) exposure to other viral communicable diseases: Secondary | ICD-10-CM | POA: Diagnosis not present

## 2019-02-12 DIAGNOSIS — R0789 Other chest pain: Secondary | ICD-10-CM

## 2019-02-12 DIAGNOSIS — Z818 Family history of other mental and behavioral disorders: Secondary | ICD-10-CM

## 2019-02-12 DIAGNOSIS — Z833 Family history of diabetes mellitus: Secondary | ICD-10-CM

## 2019-02-12 DIAGNOSIS — Z7982 Long term (current) use of aspirin: Secondary | ICD-10-CM

## 2019-02-12 DIAGNOSIS — I7779 Dissection of other artery: Secondary | ICD-10-CM | POA: Diagnosis not present

## 2019-02-12 DIAGNOSIS — R52 Pain, unspecified: Secondary | ICD-10-CM

## 2019-02-12 DIAGNOSIS — I672 Cerebral atherosclerosis: Secondary | ICD-10-CM | POA: Diagnosis not present

## 2019-02-12 DIAGNOSIS — F419 Anxiety disorder, unspecified: Secondary | ICD-10-CM | POA: Diagnosis not present

## 2019-02-12 DIAGNOSIS — J439 Emphysema, unspecified: Secondary | ICD-10-CM | POA: Diagnosis present

## 2019-02-12 DIAGNOSIS — Z955 Presence of coronary angioplasty implant and graft: Secondary | ICD-10-CM | POA: Diagnosis not present

## 2019-02-12 DIAGNOSIS — J449 Chronic obstructive pulmonary disease, unspecified: Secondary | ICD-10-CM | POA: Diagnosis not present

## 2019-02-12 DIAGNOSIS — J90 Pleural effusion, not elsewhere classified: Secondary | ICD-10-CM | POA: Diagnosis not present

## 2019-02-12 LAB — URINALYSIS, COMPLETE (UACMP) WITH MICROSCOPIC
Bacteria, UA: NONE SEEN
Bilirubin Urine: NEGATIVE
Glucose, UA: NEGATIVE mg/dL
Hgb urine dipstick: NEGATIVE
Ketones, ur: NEGATIVE mg/dL
Leukocytes,Ua: NEGATIVE
Nitrite: NEGATIVE
Protein, ur: NEGATIVE mg/dL
Specific Gravity, Urine: 1.012 (ref 1.005–1.030)
pH: 7 (ref 5.0–8.0)

## 2019-02-12 LAB — BASIC METABOLIC PANEL
Anion gap: 11 (ref 5–15)
BUN: 9 mg/dL (ref 6–20)
CO2: 21 mmol/L — ABNORMAL LOW (ref 22–32)
Calcium: 8.9 mg/dL (ref 8.9–10.3)
Chloride: 105 mmol/L (ref 98–111)
Creatinine, Ser: 0.78 mg/dL (ref 0.44–1.00)
GFR calc Af Amer: >60 mL/min (ref >60)
GFR calc non Af Amer: >60 mL/min (ref >60)
Glucose, Bld: 109 mg/dL — ABNORMAL HIGH (ref 70–99)
Potassium: 3.7 mmol/L (ref 3.5–5.1)
Sodium: 137 mmol/L (ref 135–145)

## 2019-02-12 LAB — PROTIME-INR
INR: 1.1 (ref 0.8–1.2)
Prothrombin Time: 13.8 seconds (ref 11.4–15.2)

## 2019-02-12 LAB — CBC WITH DIFFERENTIAL/PLATELET
Abs Immature Granulocytes: 0.05 10*3/uL (ref 0.00–0.07)
Basophils Absolute: 0.1 10*3/uL (ref 0.0–0.1)
Basophils Relative: 0 %
Eosinophils Absolute: 0.5 10*3/uL (ref 0.0–0.5)
Eosinophils Relative: 4 %
HCT: 31.8 % — ABNORMAL LOW (ref 36.0–46.0)
Hemoglobin: 10.6 g/dL — ABNORMAL LOW (ref 12.0–15.0)
Immature Granulocytes: 0 %
Lymphocytes Relative: 15 %
Lymphs Abs: 1.9 10*3/uL (ref 0.7–4.0)
MCH: 31 pg (ref 26.0–34.0)
MCHC: 33.3 g/dL (ref 30.0–36.0)
MCV: 93 fL (ref 80.0–100.0)
Monocytes Absolute: 1.3 10*3/uL — ABNORMAL HIGH (ref 0.1–1.0)
Monocytes Relative: 10 %
Neutro Abs: 9.3 10*3/uL — ABNORMAL HIGH (ref 1.7–7.7)
Neutrophils Relative %: 71 %
Platelets: 201 10*3/uL (ref 150–400)
RBC: 3.42 MIL/uL — ABNORMAL LOW (ref 3.87–5.11)
RDW: 13.7 % (ref 11.5–15.5)
WBC: 13.2 10*3/uL — ABNORMAL HIGH (ref 4.0–10.5)
nRBC: 0 % (ref 0.0–0.2)

## 2019-02-12 LAB — LACTIC ACID, PLASMA: Lactic Acid, Venous: 1.6 mmol/L (ref 0.5–1.9)

## 2019-02-12 LAB — HEPARIN LEVEL (UNFRACTIONATED): Heparin Unfractionated: 0.53 IU/mL (ref 0.30–0.70)

## 2019-02-12 LAB — SARS CORONAVIRUS 2 BY RT PCR (HOSPITAL ORDER, PERFORMED IN ~~LOC~~ HOSPITAL LAB): SARS Coronavirus 2: NEGATIVE

## 2019-02-12 MED ORDER — LUBIPROSTONE 24 MCG PO CAPS
24.0000 ug | ORAL_CAPSULE | Freq: Two times a day (BID) | ORAL | Status: DC | PRN
  Filled 2019-02-12: qty 1

## 2019-02-12 MED ORDER — LACTATED RINGERS IV BOLUS
1000.0000 mL | Freq: Once | INTRAVENOUS | Status: AC
  Administered 2019-02-12: 1000 mL via INTRAVENOUS

## 2019-02-12 MED ORDER — KETOROLAC TROMETHAMINE 30 MG/ML IJ SOLN
INTRAMUSCULAR | Status: AC
  Filled 2019-02-12: qty 1

## 2019-02-12 MED ORDER — ALBUTEROL SULFATE (2.5 MG/3ML) 0.083% IN NEBU
3.0000 mL | INHALATION_SOLUTION | Freq: Two times a day (BID) | RESPIRATORY_TRACT | Status: DC
  Administered 2019-02-12 – 2019-02-14 (×4): 3 mL via RESPIRATORY_TRACT
  Filled 2019-02-12 (×3): qty 3

## 2019-02-12 MED ORDER — TIOTROPIUM BROMIDE MONOHYDRATE 18 MCG IN CAPS
18.0000 ug | ORAL_CAPSULE | Freq: Every day | RESPIRATORY_TRACT | Status: DC
  Administered 2019-02-13 – 2019-02-14 (×2): 18 ug via RESPIRATORY_TRACT
  Filled 2019-02-12: qty 5

## 2019-02-12 MED ORDER — VITAMIN E 45 MG (100 UNIT) PO CAPS
100.0000 [IU] | ORAL_CAPSULE | Freq: Every day | ORAL | Status: DC
  Administered 2019-02-13 – 2019-02-14 (×2): 100 [IU] via ORAL
  Filled 2019-02-12 (×2): qty 1

## 2019-02-12 MED ORDER — METOPROLOL TARTRATE 25 MG PO TABS
25.0000 mg | ORAL_TABLET | Freq: Two times a day (BID) | ORAL | Status: DC
  Administered 2019-02-12 – 2019-02-14 (×5): 25 mg via ORAL
  Filled 2019-02-12 (×5): qty 1

## 2019-02-12 MED ORDER — VITAMIN D 25 MCG (1000 UNIT) PO TABS
1000.0000 [IU] | ORAL_TABLET | Freq: Every day | ORAL | Status: DC
  Administered 2019-02-13 – 2019-02-14 (×2): 1000 [IU] via ORAL
  Filled 2019-02-12 (×2): qty 1

## 2019-02-12 MED ORDER — ADULT MULTIVITAMIN W/MINERALS CH
1.0000 | ORAL_TABLET | Freq: Every day | ORAL | Status: DC
  Administered 2019-02-13 – 2019-02-14 (×2): 1 via ORAL
  Filled 2019-02-12 (×2): qty 1

## 2019-02-12 MED ORDER — OXYCODONE HCL 5 MG PO TABS
5.0000 mg | ORAL_TABLET | ORAL | Status: DC | PRN
  Administered 2019-02-13: 5 mg via ORAL
  Filled 2019-02-12: qty 1

## 2019-02-12 MED ORDER — HYDROMORPHONE HCL 1 MG/ML IJ SOLN
1.0000 mg | INTRAMUSCULAR | Status: AC | PRN
  Administered 2019-02-12: 1 mg via INTRAVENOUS
  Filled 2019-02-12: qty 1

## 2019-02-12 MED ORDER — ONDANSETRON HCL 4 MG/2ML IJ SOLN: 4.0000 mg | Freq: Four times a day (QID) | INTRAMUSCULAR | Status: DC | PRN

## 2019-02-12 MED ORDER — NICOTINE 21 MG/24HR TD PT24
21.0000 mg | MEDICATED_PATCH | Freq: Every day | TRANSDERMAL | Status: DC
  Administered 2019-02-12 – 2019-02-14 (×3): 21 mg via TRANSDERMAL
  Filled 2019-02-12 (×3): qty 1

## 2019-02-12 MED ORDER — HEPARIN (PORCINE) 25000 UT/250ML-% IV SOLN
1250.0000 [IU]/h | INTRAVENOUS | Status: DC
  Administered 2019-02-12: 14:00:00 850 [IU]/h via INTRAVENOUS
  Administered 2019-02-13 – 2019-02-14 (×2): 1250 [IU]/h via INTRAVENOUS
  Filled 2019-02-12 (×3): qty 250

## 2019-02-12 MED ORDER — VITAMIN C 500 MG/5ML PO SYRP
100.0000 mg | ORAL_SOLUTION | Freq: Every day | ORAL | Status: DC
  Administered 2019-02-13: 100 mg via ORAL
  Filled 2019-02-12 (×3): qty 1

## 2019-02-12 MED ORDER — MORPHINE SULFATE (PF) 4 MG/ML IV SOLN
4.0000 mg | Freq: Once | INTRAVENOUS | Status: AC
  Administered 2019-02-12: 4 mg via INTRAVENOUS
  Filled 2019-02-12: qty 1

## 2019-02-12 MED ORDER — ALBUTEROL SULFATE (2.5 MG/3ML) 0.083% IN NEBU
INHALATION_SOLUTION | RESPIRATORY_TRACT | Status: AC
  Filled 2019-02-12: qty 3

## 2019-02-12 MED ORDER — IOHEXOL 350 MG/ML SOLN
125.0000 mL | Freq: Once | INTRAVENOUS | Status: AC | PRN
  Administered 2019-02-12: 60 mL via INTRAVENOUS

## 2019-02-12 MED ORDER — HEPARIN BOLUS VIA INFUSION
3000.0000 [IU] | Freq: Once | INTRAVENOUS | Status: AC
  Administered 2019-02-12: 3000 [IU] via INTRAVENOUS
  Filled 2019-02-12: qty 3000

## 2019-02-12 MED ORDER — MOMETASONE FURO-FORMOTEROL FUM 200-5 MCG/ACT IN AERO
2.0000 | INHALATION_SPRAY | Freq: Two times a day (BID) | RESPIRATORY_TRACT | Status: DC
  Administered 2019-02-12 – 2019-02-14 (×4): 2 via RESPIRATORY_TRACT
  Filled 2019-02-12: qty 8.8

## 2019-02-12 MED ORDER — NICOTINE 21 MG/24HR TD PT24: 21.0000 mg | MEDICATED_PATCH | Freq: Every day | TRANSDERMAL | Status: DC

## 2019-02-12 MED ORDER — TRIAMCINOLONE ACETONIDE 55 MCG/ACT NA AERO
1.0000 | INHALATION_SPRAY | Freq: Every day | NASAL | Status: DC
  Administered 2019-02-13 – 2019-02-14 (×2): 1 via NASAL
  Filled 2019-02-12 (×2): qty 10.8

## 2019-02-12 MED ORDER — ONDANSETRON HCL 4 MG/2ML IJ SOLN
4.0000 mg | Freq: Once | INTRAMUSCULAR | Status: AC
  Administered 2019-02-12: 4 mg via INTRAVENOUS
  Filled 2019-02-12: qty 2

## 2019-02-12 MED ORDER — ACETAMINOPHEN 325 MG PO TABS: 650.0000 mg | ORAL_TABLET | Freq: Four times a day (QID) | ORAL | Status: DC | PRN

## 2019-02-12 MED ORDER — METOPROLOL TARTRATE 5 MG/5ML IV SOLN
INTRAVENOUS | Status: AC
  Filled 2019-02-12: qty 25

## 2019-02-12 MED ORDER — LORATADINE 10 MG PO TABS
10.0000 mg | ORAL_TABLET | Freq: Every day | ORAL | Status: DC
  Administered 2019-02-13 – 2019-02-14 (×2): 10 mg via ORAL
  Filled 2019-02-12 (×2): qty 1

## 2019-02-12 MED ORDER — SODIUM CHLORIDE 0.9 % IV SOLN: INTRAVENOUS | Status: AC

## 2019-02-12 MED ORDER — KETOROLAC TROMETHAMINE 30 MG/ML IJ SOLN
30.0000 mg | Freq: Four times a day (QID) | INTRAMUSCULAR | Status: DC
  Administered 2019-02-12 – 2019-02-14 (×8): 30 mg via INTRAVENOUS
  Filled 2019-02-12 (×7): qty 1

## 2019-02-12 MED ORDER — OXYCODONE HCL 5 MG PO TABS
10.0000 mg | ORAL_TABLET | ORAL | Status: DC | PRN
  Administered 2019-02-12 – 2019-02-14 (×6): 10 mg via ORAL
  Filled 2019-02-12 (×6): qty 2

## 2019-02-12 NOTE — ED Triage Notes (Signed)
Pt reports released from the hospital last Friday after having surgery to replace stents and started with a fever on Saturday. Pt reports MD advised her to come in incase to be checked for infection
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
## 2019-02-12 NOTE — ED Notes (Signed)
Called [facility] to see why pt had not been gotten from ED yet. Charge RN Melissa unaware that they were to come and get pt due to surge RED.

## 2019-02-12 NOTE — ED Notes (Signed)
COVID-19 swab taken to lab by this tech. Handed off to [place] in lab.

## 2019-02-12 NOTE — ED Provider Notes (Addendum)
[facility] Emergency Department Provider Note   ____________________________________________   First MD Initiated Contact with Patient 02/12/19 1002     (approximate)  I have reviewed the triage vital signs and the nursing notes.   HISTORY  Chief Complaint Fever and Chest Pain   HPI Christy Mason is a 58 y.o. female with history of COPD, hypertension, PAD status post recent angioplasty and stent replacement in right upper extremity presents to the ED complaining of right shoulder and chest pain.  Patient reports that she has been dealing with pain in this area since her procedure approximately 1 week ago.  Pain has seemed to worsen since then and she has begun noticing fevers over the past couple of days.  She reports a T-max of 101.5 yesterday evening.  She has not noticed any redness, swelling, or drainage either at her right wrist or right groin access sites.  She denies any sick contacts and has not had any cough or shortness of breath.  She also denies any abdominal pain, flank pain, dysuria, or hematuria.        Past Medical History:  Diagnosis Date  . Allergy   . Anxiety   . Arthritis   . Asthma   . Complication of anesthesia   . COPD (chronic obstructive pulmonary disease) (HCC)   . Emphysema of lung (HCC)   . GERD (gastroesophageal reflux disease)   . History of hiatal hernia   . Hypertension   . Hyperthyroidism    h/o  . Neuromuscular disorder (HCC)   . PONV (postoperative nausea and vomiting)   . Thyroid disease     Patient Active Problem List   Diagnosis Date Noted  . 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 Arm pain, central, right 02/07/2019  . 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 COPD (chronic obstructive pulmonary disease) (HCC) 01/08/2019  . Essential hypertension 01/08/2019  . Complication associated with vascular device, sequela 01/08/2019    Past Surgical History:  Procedure Laterality Date  .  CARPAL TUNNEL RELEASE Right   . CHOLECYSTECTOMY    . FRACTURE SURGERY Right    plates  . FUNCTIONAL ENDOSCOPIC SINUS SURGERY    . SALPINGOOPHORECTOMY Right   . TUBAL LIGATION    . UPPER EXTREMITY ANGIOGRAPHY Right 01/23/2019   Procedure: UPPER EXTREMITY ANGIOGRAPHY;  Surgeon: Renford DillsSchnier, Gregory G, MD;  Location: [facility] INVASIVE CV LAB;  Service: Cardiovascular;  Laterality: Right;  . UPPER EXTREMITY ANGIOGRAPHY Right 02/07/2019   Procedure: UPPER EXTREMITY ANGIOGRAPHY;  Surgeon: Renford DillsSchnier, Gregory G, MD;  Location: [facility] INVASIVE CV LAB;  Service: Cardiovascular;  Laterality: Right;    Prior to Admission medications   Medication Sig Start Date End Date Taking? Authorizing Provider  ADVAIR DISKUS 250-50 MCG/DOSE AEPB Inhale 1 puff into the lungs 2 (two) times a day. INL 1 PUFF PO BID 02/09/19  Yes Pollak, Adriana M, PA-C  Ascorbic Acid (VITAMIN C) 100 MG tablet Take 100 mg by mouth daily.    Yes [provider]  aspirin EC 81 MG tablet Take 81 mg by mouth daily.   Yes [provider]  cetirizine (ZYRTEC) 10 MG tablet Take 10 mg by mouth daily.   Yes [provider]  cholecalciferol (VITAMIN D3) 25 MCG (1000 UT) tablet Take 1,000 Units by mouth daily.   Yes [provider]  clopidogrel (PLAVIX) 75 MG tablet Take 1 tablet (75 mg total) by mouth daily. 02/08/19  Yes Schnier, Latina CraverGregory G, MD  lisinopril (  ZESTRIL) 20 MG tablet Take 20 mg by mouth daily.   Yes [provider]  lubiprostone (AMITIZA) 24 MCG capsule Take 1 capsule (24 mcg total) by mouth 2 (two) times daily with a meal. Patient taking differently: Take 24 mcg by mouth 2 (two) times daily as needed (constipation.).  01/10/19 04/10/19 Yes Trey SailorsPollak, Adriana M, PA-C  Multiple Vitamin (MULTIVITAMIN WITH MINERALS) TABS tablet Take 1 tablet by mouth daily.   Yes [provider]  oxyCODONE (OXY IR/ROXICODONE) 5 MG immediate release tablet One to Two Tabs Every Six Hours As Needed For Pain 02/09/19  Yes  Stegmayer, Ranae PlumberKimberly A, PA-C  PROAIR HFA 108 (90 Base) MCG/ACT inhaler INHALE 2 PUFFS INTO THE LUNGS EVERY 6 HOURS AS NEEDED FOR WHEEZING OR SHORTNESS OF BREATH Patient taking differently: Inhale 2 puffs into the lungs 2 (two) times daily.  02/09/19  Yes Trey SailorsPollak, Adriana M, PA-C  SPIRIVA HANDIHALER 18 MCG inhalation capsule Place 1 capsule (18 mcg total) into inhaler and inhale daily. 12/13/18 03/13/19 Yes Pollak, Adriana M, PA-C  SUBOXONE 8-2 MG FILM Take 0.5 Film by mouth See admin instructions. Take 0.5 film by mouth scheduled in the morning, may take other 0.5 film if needed for pain. 12/21/18  Yes [provider]  triamcinolone (NASACORT ALLERGY 24HR) 55 MCG/ACT AERO nasal inhaler Place 1 spray into the nose daily.   Yes [provider]  vitamin E 100 UNIT capsule Take 100 Units by mouth daily.    Yes [provider]    Allergies Nsaids, Other, and Sular [nisoldipine er]  Family History  Problem Relation Age of Onset  . Depression Mother   . Hypertension Mother   . COPD Mother   . Diabetes Father   . Hypertension Father     Social History Social History   Tobacco Use  . Smoking status: Current Every Day Smoker    Packs/day: 1.00    Years: 25.00    Pack years: 25.00    Types: Cigarettes  . Smokeless tobacco: Never Used  Substance Use Topics  . Alcohol use: Not Currently  . Drug use: Not Currently    Review of Systems Constitutional: fever/chills Eyes: No visual changes. ENT: No sore throat. Cardiovascular: chest pain. Respiratory: Denies shortness of breath. Gastrointestinal: No abdominal pain.  No nausea, no vomiting.  No diarrhea.  No constipation. Genitourinary: Negative for dysuria. Musculoskeletal: Negative for back pain. Skin: Negative for rash. Neurological: Negative for headaches, focal weakness or numbness.  ____________________________________________   PHYSICAL EXAM:  VITAL SIGNS: ED Triage Vitals  Enc Vitals Group     BP --       Pulse Rate 02/12/19 1000 (!) 121     Resp 02/12/19 1000 18     Temp 02/12/19 1000 98.7 F (37.1 C)     Temp Source 02/12/19 1000 Oral     SpO2 02/12/19 1000 97 %     Weight 02/12/19 0953 184 lb (83.5 kg)     Height 02/12/19 0953 5\' 2"  (1.575 m)     Head Circumference --      Peak Flow --      Pain Score 02/12/19 0952 8     Pain Loc --      Pain Edu? --      Excl. in GC? --     Constitutional: Alert and oriented. Eyes: Conjunctivae are normal. Head: Atraumatic. Nose: No congestion/rhinnorhea. Mouth/Throat: Mucous membranes are moist. Neck: Normal ROM Cardiovascular: Normal rate, regular rhythm. Grossly normal heart sounds.  2+ radial pulses bilaterally. Respiratory: Normal respiratory effort.  No retractions. Lungs CTAB. Gastrointestinal: Soft and nontender. No distention. Genitourinary: deferred Musculoskeletal: No lower extremity tenderness nor edema.  Tenderness along right neck and upper chest without overlying erythema, warmth, or fluctuance. Neurologic:  Normal speech and language. No gross focal neurologic deficits are appreciated. Skin:  Skin is warm, dry and intact. No rash noted.  Right wrist and right groin access sites clean, dry, and intact with mild ecchymosis. Psychiatric: Mood and affect are normal. Speech and behavior are normal.  ____________________________________________   LABS (all labs ordered are listed, but only abnormal results are displayed)  Labs Reviewed  CBC WITH DIFFERENTIAL/PLATELET - Abnormal; Notable for the following components:      Result Value   WBC 13.2 (*)    RBC 3.42 (*)    Hemoglobin 10.6 (*)    HCT 31.8 (*)    Neutro Abs 9.3 (*)    Monocytes Absolute 1.3 (*)    All other components within normal limits  BASIC METABOLIC PANEL - Abnormal; Notable for the following components:   CO2 21 (*)    Glucose, Bld 109 (*)    All other components within normal limits  URINALYSIS, COMPLETE (UACMP) WITH MICROSCOPIC - Abnormal; Notable  for the following components:   Color, Urine YELLOW (*)    APPearance CLEAR (*)    All other components within normal limits  SARS CORONAVIRUS 2 (HOSPITAL ORDER, PERFORMED IN [facility])  CULTURE, BLOOD (ROUTINE X 2)  CULTURE, BLOOD (ROUTINE X 2)  LACTIC ACID, PLASMA  HEPARIN LEVEL (UNFRACTIONATED)  PROTIME-INR   ____________________________________________  ____________________________________________  RADIOLOGY  Significant for right brachiocephalic artery dissection along with multiple other small areas of arterial dissection, see radiology report.   ____________________________________________   PROCEDURES  Procedure(s) performed (including Critical Care):  .Critical Care Performed by: Chesley NoonJessup, Loleta Frommelt, MD Authorized by: Chesley NoonJessup, Tattianna Schnarr, MD   Critical care provider statement:    Critical care time (minutes):  45   Critical care time was exclusive of:  Separately billable procedures and treating other patients and teaching time   Critical care was necessary to treat or prevent imminent or life-threatening deterioration of the following conditions:  Circulatory failure   Critical care was time spent personally by me on the following activities:  Discussions with consultants, evaluation of patient's response to treatment, examination of patient, ordering and performing treatments and interventions, ordering and review of laboratory studies, ordering and review of radiographic studies, pulse oximetry, re-evaluation of patient's condition, obtaining history from patient or surrogate and review of old charts     ____________________________________________   INITIAL IMPRESSION / ASSESSMENT AND PLAN / ED COURSE       58 year old female status post recent angioplasty and stenting in right upper extremity now presenting with worsening pain in that area along with fevers.  Work-up does not show obvious source of infection, chest x-ray and UA unremarkable.   Patient afebrile here and she is Sirs negative.  CTA obtained and reveals dissection of multiple small areas of proximal great vessels.  No significant ischemia noted as a result of dissection.  Case discussed with Dr. Gilda CreaseSchnier of vascular surgery, who recommends starting patient on heparin drip and admission to vascular surgery service for further management.  Blood pressure within normal limits, Dr. Gilda CreaseSchnier agrees with plan to hold off on aggressive BP management at this time.      ____________________________________________   FINAL CLINICAL IMPRESSION(S) / ED DIAGNOSES  Final diagnoses:  Pain  Arterial dissection (HCC)  Other chest pain     ED Discharge Orders    None       Note:  This document was prepared using Dragon voice recognition software and may include unintentional dictation errors.   Blake Divine, MD 02/12/19 1640    Blake Divine, MD 02/27/19 1029

## 2019-02-12 NOTE — ED Notes (Signed)
Called Pharmacy and waiting on metoprolol tablet to come up
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
## 2019-02-12 NOTE — Progress Notes (Signed)
ANTICOAGULATION CONSULT NOTE - Initial Consult  Pharmacy Consult for Heparin Drip  Indication: Brachiocephalic dissection-arterial   Allergies  Allergen Reactions  . Nsaids Other (See Comments)    irritates stomach severley   . Other     Steroids-muscle weakness and headache  . Sular [Nisoldipine Er] Palpitations    Patient Measurements: Height: 5\' 2"  (157.5 cm) Weight: 184 lb (83.5 kg) IBW/kg (Calculated) : 50.1 Heparin Dosing Weight: 68.9 kg  Vital Signs: Temp: 98.2 F (36.8 C) (08/03 2216) Temp Source: Oral (08/03 2216) BP: 121/77 (08/03 2216) Pulse Rate: 91 (08/03 2216)  Labs: Recent Labs    02/12/19 1026 02/12/19 2104  HGB 10.6*  --   HCT 31.8*  --   PLT 201  --   LABPROT  --  13.8  INR  --  1.1  HEPARINUNFRC  --  0.53  CREATININE 0.78  --     Estimated Creatinine Clearance: 75 mL/min (by C-G formula based on SCr of 0.78 mg/dL).   Medical History: Past Medical History:  Diagnosis Date  . Allergy   . Anxiety   . Arthritis   . Asthma   . Complication of anesthesia   . COPD (chronic obstructive pulmonary disease) ([place])   . Emphysema of lung ([place])   . GERD (gastroesophageal reflux disease)   . History of hiatal hernia   . Hypertension   . Hyperthyroidism    h/o  . Neuromuscular disorder ([place])   . PONV (postoperative nausea and vomiting)   . Thyroid disease     Medications:  Scheduled:  . albuterol  3 mL Inhalation BID  . [START ON 02/13/2019] ascorbic acid  100 mg Oral Daily  . [START ON 02/13/2019] cholecalciferol  1,000 Units Oral Daily  . ketorolac  30 mg Intravenous Q6H  . [START ON 02/13/2019] loratadine  10 mg Oral Daily  . metoprolol tartrate      . metoprolol tartrate  25 mg Oral BID  . mometasone-formoterol  2 puff Inhalation BID  . [START ON 02/13/2019] multivitamin with minerals  1 tablet Oral Daily  . nicotine  21 mg Transdermal Daily  . [START ON 02/13/2019] tiotropium  18 mcg Inhalation Daily  . [START ON 02/13/2019] triamcinolone  1  spray Nasal Daily  . [START ON 02/13/2019] vitamin E  100 Units Oral Daily   Infusions:  . sodium chloride    . heparin 850 Units/hr (02/12/19 1335)    Assessment: 58 yo F to start Heparin drip for Brachiocephalic dissection. Patient just discharged 02/09/2019 after right upper extremity endovascular intervention . Patient on Plavix/ASA PTA per Med Rec Hgb 10.6  Plt 201  INR pending  APTT was ordered but specimen was hemolyzed and Heparin drip already started-   Goal of Therapy:  Heparin level 0.3-0.7 units/ml Monitor platelets by anticoagulation protocol: Yes   Plan:  Give 3000 units bolus x 1 Start heparin infusion at 850 units/hr Check anti-Xa level in 6 hours and daily while on heparin Continue to monitor H&H and platelets   8/3:  HL @ 2100 = 0.53 Will continue this pt on current rate and recheck HL in 6 hrs on 8/4 @ 0300.   Sheyenne Konz D 02/12/2019,10:24 PM

## 2019-02-12 NOTE — Telephone Encounter (Signed)
Per patient her temp has ranged from 100.7-101.5 over the last few day. She states she is in serious pain.  I spoke with Schnier and he advised that patient go to the ED due to the fever. Patient has been made aware of the advised and verbalized that she would go to ED. AS, CMA

## 2019-02-12 NOTE — ED Notes (Signed)
Pt give meal tray

## 2019-02-12 NOTE — ED Notes (Signed)
CT called and informed pt now has 20 g IV

## 2019-02-12 NOTE — ED Notes (Signed)
ED TO INPATIENT HANDOFF REPORT  ED Nurse Name and Phone #: Sam [phone]  S Name/Age/Gender Christy BlalockSherry M Mason 58 y.o. female Room/Bed: ED14A/ED14A  Code Status   Code Status: Prior  Home/SNF/Other Home Patient oriented to: self, place, time and situation Is this baseline? Yes   Triage Complete: Triage complete  Chief Complaint post op complications  Triage Note Pt reports released from the hospital last Friday after having surgery to replace stents and started with a fever on Saturday. Pt reports MD advised her to come in incase to be checked for infection   Allergies Allergies  Allergen Reactions  . Nsaids Other (See Comments)    irritates stomach severley   . Other     Steroids-muscle weakness and headache  . Sular [Nisoldipine Er] Palpitations    Level of Care/Admitting Diagnosis ED Disposition    ED Disposition Condition Comment   Admit  Hospital Area: [facility] [[id number]]  Level of Care: Med-Surg [16]  Covid Evaluation: Person Under Investigation (PUI)  Diagnosis: Dissection of artery of upper extremity [facility](HCC) [[id number]]) [[id number]]  Admitting Physician: Renford DillsSCHNIER, GREGORY G [[id number]]  Attending Physician: Renford DillsSCHNIER, GREGORY G [[id number]]  Estimated length of stay: past midnight tomorrow  Certification:: I certify this patient will need inpatient services for at least 2 midnights  PT Class (Do Not Modify): Inpatient [101]  PT Acc Code (Do Not Modify): Private [1]       B Medical/Surgery History Past Medical History:  Diagnosis Date  . Allergy   . Anxiety   . Arthritis   . Asthma   . Complication of anesthesia   . COPD (chronic obstructive pulmonary disease) (HCC)   . Emphysema of lung (HCC)   . GERD (gastroesophageal reflux disease)   . History of hiatal hernia   . Hypertension   . Hyperthyroidism    h/o  . Neuromuscular disorder (HCC)   . PONV (postoperative nausea and vomiting)   . Thyroid disease    Past Surgical History:  Procedure Laterality  Date  . CARPAL TUNNEL RELEASE Right   . CHOLECYSTECTOMY    . FRACTURE SURGERY Right    plates  . FUNCTIONAL ENDOSCOPIC SINUS SURGERY    . SALPINGOOPHORECTOMY Right   . TUBAL LIGATION    . UPPER EXTREMITY ANGIOGRAPHY Right 01/23/2019   Procedure: UPPER EXTREMITY ANGIOGRAPHY;  Surgeon: Renford DillsSchnier, Gregory G, MD;  Location: [facility] INVASIVE CV LAB;  Service: Cardiovascular;  Laterality: Right;  . UPPER EXTREMITY ANGIOGRAPHY Right 02/07/2019   Procedure: UPPER EXTREMITY ANGIOGRAPHY;  Surgeon: Renford DillsSchnier, Gregory G, MD;  Location: [facility] INVASIVE CV LAB;  Service: Cardiovascular;  Laterality: Right;     A IV Location/Drains/Wounds Patient Lines/Drains/Airways Status   Active Line/Drains/Airways    Name:   Placement date:   Placement time:   Site:   Days:   Peripheral IV 02/12/19 Left Forearm   02/12/19    1115    Forearm   less than 1   Peripheral IV 02/12/19 Right Arm   02/12/19    1203    Arm   less than 1   Peripheral IV 02/12/19 Left Hand   02/12/19    1306    Hand   less than 1   Sheath 02/07/19 Right Arterial;Femoral   02/07/19    1437    Arterial;Femoral   5          Intake/Output Last 24 hours No intake or output data in the 24 hours ending 02/12/19 2017  Labs/Imaging Results for orders placed  or performed during the hospital encounter of 02/12/19 (from the past 48 hour(s))  CBC with Differential     Status: Abnormal   Collection Time: 02/12/19 10:26 AM  Result Value Ref Range   WBC 13.2 (H) 4.0 - 10.5 K/uL   RBC 3.42 (L) 3.87 - 5.11 MIL/uL   Hemoglobin 10.6 (L) 12.0 - 15.0 g/dL   HCT 31.8 (L) 36.0 - 46.0 %   MCV 93.0 80.0 - 100.0 fL   MCH 31.0 26.0 - 34.0 pg   MCHC 33.3 30.0 - 36.0 g/dL   RDW 13.7 11.5 - 15.5 %   Platelets 201 150 - 400 K/uL   nRBC 0.0 0.0 - 0.2 %   Neutrophils Relative % 71 %   Neutro Abs 9.3 (H) 1.7 - 7.7 K/uL   Lymphocytes Relative 15 %   Lymphs Abs 1.9 0.7 - 4.0 K/uL   Monocytes Relative 10 %   Monocytes Absolute 1.3 (H) 0.1 - 1.0 K/uL   Eosinophils  Relative 4 %   Eosinophils Absolute 0.5 0.0 - 0.5 K/uL   Basophils Relative 0 %   Basophils Absolute 0.1 0.0 - 0.1 K/uL   Immature Granulocytes 0 %   Abs Immature Granulocytes 0.05 0.00 - 0.07 K/uL    Comment: Performed at [facility], [address] [place], [place] [address]  Basic metabolic panel     Status: Abnormal   Collection Time: 02/12/19 10:26 AM  Result Value Ref Range   Sodium 137 135 - 145 mmol/L   Potassium 3.7 3.5 - 5.1 mmol/L   Chloride 105 98 - 111 mmol/L   CO2 21 (L) 22 - 32 mmol/L   Glucose, Bld 109 (H) 70 - 99 mg/dL   BUN 9 6 - 20 mg/dL   Creatinine, Ser 0.78 0.44 - 1.00 mg/dL   Calcium 8.9 8.9 - 10.3 mg/dL   GFR calc non Af Amer >60 >60 mL/min   GFR calc Af Amer >60 >60 mL/min   Anion gap 11 5 - 15    Comment: Performed at [facility], [address] [place], [place] [address]  Lactic acid, plasma     Status: None   Collection Time: 02/12/19 10:26 AM  Result Value Ref Range   Lactic Acid, Venous 1.6 0.5 - 1.9 mmol/L    Comment: Performed at [facility], [address] [place], [place] [address]  Urinalysis, Complete w Microscopic     Status: Abnormal   Collection Time: 02/12/19 10:26 AM  Result Value Ref Range   Color, Urine YELLOW (A) YELLOW   APPearance CLEAR (A) CLEAR   Specific Gravity, Urine 1.012 1.005 - 1.030   pH 7.0 5.0 - 8.0   Glucose, UA NEGATIVE NEGATIVE mg/dL   Hgb urine dipstick NEGATIVE NEGATIVE   Bilirubin Urine NEGATIVE NEGATIVE   Ketones, ur NEGATIVE NEGATIVE mg/dL   Protein, ur NEGATIVE NEGATIVE mg/dL   Nitrite NEGATIVE NEGATIVE   Leukocytes,Ua NEGATIVE NEGATIVE   RBC / HPF 0-5 0 - 5 RBC/hpf   WBC, UA 0-5 0 - 5 WBC/hpf   Bacteria, UA NONE SEEN NONE SEEN   Squamous Epithelial / LPF 6-10 0 - 5   Mucus PRESENT     Comment: Performed at [facility], [address] [place], [place] [address]  SARS Coronavirus 2 [facility] order, Performed in [facility] hospital lab) Nasopharyngeal  Nasopharyngeal Swab     Status: None   Collection Time: 02/12/19  1:19 PM   Specimen: Nasopharyngeal Swab  Result Value Ref Range   SARS Coronavirus 2 NEGATIVE NEGATIVE    Comment: (NOTE) If result is NEGATIVE SARS-CoV-2 target nucleic acids are NOT DETECTED. The SARS-CoV-2 RNA is generally detectable in upper and lower  respiratory specimens during the acute phase of infection. The lowest  concentration of SARS-CoV-2 viral copies this assay can detect is 250  copies / mL. A negative result does not preclude SARS-CoV-2 infection  and should not be used as the sole basis for treatment or other  patient management decisions.  A negative result may occur with  improper specimen collection / handling, submission of specimen other  than nasopharyngeal swab, presence of viral mutation(s) within the  areas targeted by this assay, and inadequate number of viral copies  (<250 copies / mL). A negative result must be combined with clinical  observations, patient history, and epidemiological information. If result is POSITIVE SARS-CoV-2 target nucleic acids are DETECTED. The SARS-CoV-2 RNA is generally detectable in upper and lower  respiratory specimens dur ing the acute phase of infection.  Positive  results are indicative of active infection with SARS-CoV-2.  Clinical  correlation with patient history and other diagnostic information is  necessary to determine patient infection status.  Positive results do  not rule out bacterial infection or co-infection with other viruses. If result is PRESUMPTIVE POSTIVE SARS-CoV-2 nucleic acids MAY BE PRESENT.   A presumptive positive result was obtained on the submitted specimen  and confirmed on repeat testing.  While 2019 novel coronavirus  (SARS-CoV-2) nucleic acids may be present in the submitted sample  additional confirmatory testing may be necessary for epidemiological  and / or clinical management purposes  to differentiate between  SARS-CoV-2  and other Sarbecovirus currently known to infect humans.  If clinically indicated additional testing with an alternate test  methodology [phone]([id number]) is advised. The SARS-CoV-2 RNA is generally  detectable in upper and lower respiratory sp ecimens during the acute  phase of infection. The expected result is Negative. Fact Sheet for Patients:  BoilerBrush.com.cyhttps://www.fda.gov/media/136312/download Fact Sheet for Healthcare Providers: https://pope.com/https://www.fda.gov/media/136313/download This test is not yet approved or cleared by the [place] FDA and has been authorized for detection and/or diagnosis of SARS-CoV-2 by FDA under an Emergency Use Authorization (EUA).  This EUA will remain in effect (meaning this test can be used) for the duration of the COVID-19 declaration under Section 564(b)(1) of the Act, 21 U.S.C. section 360bbb-3(b)(1), unless the authorization is terminated or revoked sooner. Performed at [facility], [address] [place], [place] [id number]    Ct Angio Neck W And/or Wo Contrast  Result Date: 02/12/2019 CLINICAL DATA:  58 year old female with recent angioplasty and stent. New fever. EXAM: CT ANGIOGRAPHY NECK TECHNIQUE: Multidetector CT imaging of the neck was performed using the standard protocol during bolus administration of intravenous contrast. Multiplanar CT image reconstructions and MIPs were obtained to evaluate the vascular anatomy. Carotid stenosis measurements (when applicable) are obtained utilizing NASCET criteria, using the distal internal carotid diameter as the denominator. CONTRAST:  60mL OMNIPAQUE IOHEXOL 350 MG/ML SOLN COMPARISON:  None. FINDINGS: Skeleton: Absent dentition. Widespread cervical spine disc and endplate degeneration. Chronic appearing deformity of the distal right clavicle. No acute osseous abnormality identified. Upper chest: Mild atelectasis in the visible medial right upper lobe. There is centrilobular emphysema. No superior mediastinal  lymphadenopathy. Other neck: Negative thyroid, larynx, pharynx, parapharyngeal spaces, retropharyngeal space (retropharyngeal course of the left carotid, normal variant), sublingual space, submandibular spaces and parotid spaces. Visualized orbit soft tissues are  within normal limits. Negative visible brain parenchyma. Previous bilateral paranasal sinus surgery. Visualized paranasal sinuses and mastoids are well pneumatized. Aortic arch: 3 vessel arch configuration. There is arterial dissection of the brachiocephalic artery (series 4, image 84), and also the at the level of the left CCA origin tracking to the left subclavian artery origin, but not into the left carotid or subclavian. The brachiocephalic artery dissection terminates at the bifurcation of the right CCA and subclavian which both seem to arise from the true lumen. There is a short segment dissection into the right subclavian best seen on series 7, image 93. Just beyond the right subclavian artery origin vascular stents are in place and are patent to the level of the right axillary artery. There is some in stent soft plaque or thrombus demonstrated on series 7, image 73. There is no dissection extending distally into the descending aorta, or retrograde into the visible ascending aorta. Right carotid system: The right CCA arises from the true lumen of the brachiocephalic artery dissection and is widely patent. Minimal plaque at the right carotid bifurcation. No cervical right ICA stenosis. Visible right ICA siphon is negative. Left carotid system: There is a dissection along the distal margin of the left CCA origin on series 4, image 85, but not extending into the vessel. The left CCA has a partially retropharyngeal course. Mild soft and calcified plaque at the left ICA origin without stenosis. Visible left ICA siphon is negative. Vertebral arteries: The right vertebral artery origin is at the margin of the right subclavian stent on coronal image 92 of  series 7 and is poorly enhancing. The right vertebral appears non dominant and diminutive but does remain patent throughout the neck to the skull base. Intracranially the right PICA is patent. The right V4 distal to the PICA is diminutive. The vertebrobasilar junction is patent. Negative visible basilar artery. Arterial dissection along the anterior margin of the left subclavian artery origin, but not extending into the vessel. No proximal left CCA stenosis. The left vertebral artery origin is tortuous but otherwise within normal limits. The left V1 segment is tortuous. The left vertebral artery is dominant and patent to the vertebrobasilar junction without stenosis. Normal left PICA origin. Review of the MIP images confirms the above findings IMPRESSION: 1. 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 Both the right CCA and right subclavian arise from the true lumen. The dissection extends slightly into the right subclavian, but terminates proximal to right subclavian through axillary artery vascular stent(s) which are patent. - dissection of the superior aortic arch which tracks between the left CCA and subclavian artery origins, but does not propagate into either vessel. - no dissection tracking into the ascending or descending aortic segments. 2. The right subclavian stent might partially cover the right vertebral artery origin, which is poorly enhancing. However, the right vertebral remains patent to the skull base though has a non dominant/diminutive appearance. 3. There is a small volume of right subclavian in-stent plaque or thrombus distally. 4. Mild cervical carotid atherosclerosis without stenosis. Visible ICA siphons are normal. 5. No arterial stenosis in the neck. The left vertebral artery is dominant and patent. 6. emphysema (ICD10-J43.9). #1 and 2 were  discussed by telephone with Dr. Chesley Noon on 02/12/2019 at 12:51 . Electronically Signed   By: Odessa Fleming M.D.   On: 02/12/2019 12:59   Dg Chest Portable 1  View  Result Date: 02/12/2019 CLINICAL DATA:  Fever. EXAM: PORTABLE CHEST 1 VIEW COMPARISON:  06/23/2016 report. FINDINGS: Multiple stents are noted over the right subclavian region. A smaller stent is noted adjacent to the larger subclavian stents. Heart size normal. No pulmonary venous congestion. No focal infiltrate. No pleural effusion or pneumothorax. Prior plate screw fixation of multiple right ribs. IMPRESSION: No acute cardiopulmonary disease.  Right subclavian stents as above. Electronically Signed   By: Maisie Fus  Register   On: 02/12/2019 10:42    Pending Labs Unresulted Labs (From admission, onward)    Start     Ordered   02/13/19 0500  CBC  Tomorrow morning,   STAT     02/12/19 1306   02/13/19 0500  Basic metabolic panel  Tomorrow morning,   STAT     02/12/19 1306   02/13/19 0500  Magnesium  Tomorrow morning,   STAT     02/12/19 1306   02/13/19 0500  Type and screen  Once,   STAT     02/12/19 1306   02/12/19 1930  Heparin level (unfractionated)  Once-Timed,   STAT     02/12/19 1344   02/12/19 1500  Protime-INR  Once-Timed,   STAT     02/12/19 1421   02/12/19 1014  Culture, blood (routine x 2)  BLOOD CULTURE X 2,   STAT     02/12/19 1014          Vitals/Pain Today's Vitals   02/12/19 1430 02/12/19 1445 02/12/19 1500 02/12/19 1657  BP: (!) 95/51  (!) 115/92   Pulse: (!) 102 99 100 (!) 102  Resp: 18     Temp:      TempSrc:      SpO2: 94% 93% 92%   Weight:      Height:      PainSc:        Isolation Precautions No active isolations  Medications Medications  mometasone-formoterol (DULERA) 200-5 MCG/ACT inhaler 2 puff (has no administration in time range)  vitamin C tablet 100 mg (has no administration in time range)  loratadine (CLARITIN) tablet 10 mg (has no administration in time range)  cholecalciferol  (VITAMIN D3) tablet 1,000 Units (has no administration in time range)  lubiprostone (AMITIZA) capsule 24 mcg (has no administration in time range)  multivitamin with minerals tablet 1 tablet (has no administration in time range)  albuterol (VENTOLIN HFA) 108 (90 Base) MCG/ACT inhaler 2 puff (has no administration in time range)  tiotropium (SPIRIVA) inhalation capsule ([facility] use ONLY) 18 mcg (has no administration in time range)  triamcinolone (NASACORT) nasal inhaler 1 spray (has no administration in time range)  vitamin E capsule 100 Units (has no administration in time range)  heparin ADULT infusion 100 units/mL (25000 units/231mL sodium chloride 0.45%) (850 Units/hr Intravenous New Bag/Given 02/12/19 1335)  oxyCODONE (Oxy IR/ROXICODONE) immediate release tablet 5 mg (has no administration in time range)  oxyCODONE (Oxy IR/ROXICODONE) immediate release tablet 10 mg (has no administration in time range)  ketorolac (TORADOL) 30 MG/ML injection 30 mg (30 mg Intravenous Given 02/12/19 1331)  nicotine (NICODERM CQ - dosed in mg/24 hours) patch 21 mg (has no administration in time range)  metoprolol tartrate (LOPRESSOR) tablet 25 mg (25 mg Oral Given 02/12/19 1657)  acetaminophen (TYLENOL) tablet 650 mg (has no administration in time range)  ondansetron (ZOFRAN) injection 4 mg (has no administration in time range)  0.9 %  sodium chloride infusion (has no administration in time range)  metoprolol tartrate (LOPRESSOR)  5 MG/5ML injection (  Canceled Entry 02/12/19 1359)  lactated ringers bolus 1,000 mL (1,000 mLs Intravenous New Bag/Given 02/12/19 1118)  morphine 4 MG/ML injection 4 mg (4 mg Intravenous Given 02/12/19 1117)  ondansetron (ZOFRAN) injection 4 mg (4 mg Intravenous Given 02/12/19 1116)  iohexol (OMNIPAQUE) 350 MG/ML injection 125 mL (60 mLs Intravenous Contrast Given 02/12/19 1211)  heparin bolus via infusion 3,000 Units (3,000 Units Intravenous Bolus from Bag 02/12/19 1334)  HYDROmorphone (DILAUDID)  injection 1 mg (1 mg Intravenous Given 02/12/19 1344)  HYDROmorphone (DILAUDID) injection 1 mg (1 mg Intravenous Given 02/12/19 1908)    Mobility walks Low fall risk   Focused Assessments Cardiac Assessment Handoff:  Cardiac Rhythm: Normal sinus rhythm No results found for: CKTOTAL, CKMB, CKMBINDEX, TROPONINI No results found for: DDIMER Does the Patient currently have chest pain? Yes      R Recommendations: See Admitting Provider Note  Report given to:   Additional Notes:

## 2019-02-12 NOTE — ED Notes (Signed)
Called Pharmacy and spoke with Corene Cornea and updated him on what happened with blood test. Per Corene Cornea no need to collect to another tube because heparin has already been started and we can't get a baseline now.

## 2019-02-12 NOTE — Progress Notes (Signed)
ANTICOAGULATION CONSULT NOTE - Initial Consult  Pharmacy Consult for Heparin Drip  Indication: Brachiocephalic dissection-arterial   Allergies  Allergen Reactions  . Nsaids Other (See Comments)    irritates stomach severley   . Other     Steroids-muscle weakness and headache  . Sular [Nisoldipine Er] Palpitations    Patient Measurements: Height: 5\' 2"  (157.5 cm) Weight: 184 lb (83.5 kg) IBW/kg (Calculated) : 50.1 Heparin Dosing Weight: 68.9 kg  Vital Signs: Temp: 98.7 F (37.1 C) (08/03 1000) Temp Source: Oral (08/03 1000) BP: 117/65 (08/03 1400) Pulse Rate: 97 (08/03 1400)  Labs: Recent Labs    02/12/19 1026  HGB 10.6*  HCT 31.8*  PLT 201  CREATININE 0.78    Estimated Creatinine Clearance: 75 mL/min (by C-G formula based on SCr of 0.78 mg/dL).   Medical History: Past Medical History:  Diagnosis Date  . Allergy   . Anxiety   . Arthritis   . Asthma   . Complication of anesthesia   . COPD (chronic obstructive pulmonary disease) ([place])   . Emphysema of lung ([place])   . GERD (gastroesophageal reflux disease)   . History of hiatal hernia   . Hypertension   . Hyperthyroidism    h/o  . Neuromuscular disorder ([place])   . PONV (postoperative nausea and vomiting)   . Thyroid disease     Medications:  Scheduled:  . albuterol  2 puff Inhalation BID  . cholecalciferol  1,000 Units Oral Daily  . ketorolac  30 mg Intravenous Q6H  . loratadine  10 mg Oral Daily  . metoprolol tartrate      . metoprolol tartrate  25 mg Oral BID  . mometasone-formoterol  2 puff Inhalation BID  . multivitamin with minerals  1 tablet Oral Daily  . nicotine  21 mg Transdermal Daily  . tiotropium  18 mcg Inhalation Daily  . triamcinolone  1 spray Nasal Daily  . vitamin C  100 mg Oral Daily  . vitamin E  100 Units Oral Daily   Infusions:  . sodium chloride    . heparin 850 Units/hr (02/12/19 1335)    Assessment: 58 yo F to start Heparin drip for Brachiocephalic dissection.  Patient just discharged 02/09/2019 after right upper extremity endovascular intervention . Patient on Plavix/ASA PTA per Med Rec Hgb 10.6  Plt 201  INR pending  APTT was ordered but specimen was hemolyzed and Heparin drip already started-   Goal of Therapy:  Heparin level 0.3-0.7 units/ml Monitor platelets by anticoagulation protocol: Yes   Plan:  Give 3000 units bolus x 1 Start heparin infusion at 850 units/hr Check anti-Xa level in 6 hours and daily while on heparin Continue to monitor H&H and platelets  Quy Lotts A 02/12/2019,2:22 PM

## 2019-02-13 LAB — CBC
HCT: 27.5 % — ABNORMAL LOW (ref 36.0–46.0)
Hemoglobin: 9.1 g/dL — ABNORMAL LOW (ref 12.0–15.0)
MCH: 31.1 pg (ref 26.0–34.0)
MCHC: 33.1 g/dL (ref 30.0–36.0)
MCV: 93.9 fL (ref 80.0–100.0)
Platelets: 185 10*3/uL (ref 150–400)
RBC: 2.93 MIL/uL — ABNORMAL LOW (ref 3.87–5.11)
RDW: 13.7 % (ref 11.5–15.5)
WBC: 9.7 10*3/uL (ref 4.0–10.5)
nRBC: 0 % (ref 0.0–0.2)

## 2019-02-13 LAB — BASIC METABOLIC PANEL
Anion gap: 8 (ref 5–15)
BUN: 13 mg/dL (ref 6–20)
CO2: 25 mmol/L (ref 22–32)
Calcium: 8.4 mg/dL — ABNORMAL LOW (ref 8.9–10.3)
Chloride: 104 mmol/L (ref 98–111)
Creatinine, Ser: 0.76 mg/dL (ref 0.44–1.00)
GFR calc Af Amer: >60 mL/min (ref >60)
GFR calc non Af Amer: >60 mL/min (ref >60)
Glucose, Bld: 95 mg/dL (ref 70–99)
Potassium: 4 mmol/L (ref 3.5–5.1)
Sodium: 137 mmol/L (ref 135–145)

## 2019-02-13 LAB — TYPE AND SCREEN
ABO/RH(D): O POS
Antibody Screen: NEGATIVE

## 2019-02-13 LAB — MAGNESIUM: Magnesium: 1.9 mg/dL (ref 1.7–2.4)

## 2019-02-13 LAB — HEPARIN LEVEL (UNFRACTIONATED)
Heparin Unfractionated: 0.19 IU/mL — ABNORMAL LOW (ref 0.30–0.70)
Heparin Unfractionated: 0.28 IU/mL — ABNORMAL LOW (ref 0.30–0.70)
Heparin Unfractionated: 0.41 IU/mL (ref 0.30–0.70)

## 2019-02-13 MED ORDER — HEPARIN BOLUS VIA INFUSION
1000.0000 [IU] | Freq: Once | INTRAVENOUS | Status: AC
  Administered 2019-02-13: 1000 [IU] via INTRAVENOUS
  Filled 2019-02-13: qty 1000

## 2019-02-13 NOTE — Progress Notes (Signed)
ANTICOAGULATION CONSULT NOTE - Initial Consult  Pharmacy Consult for Heparin Drip  Indication: Brachiocephalic dissection-arterial   Allergies  Allergen Reactions  . Nsaids Other (See Comments)    irritates stomach severley   . Other     Steroids-muscle weakness and headache  . Sular [Nisoldipine Er] Palpitations    Patient Measurements: Height: 5\' 2"  (157.5 cm) Weight: 184 lb (83.5 kg) IBW/kg (Calculated) : 50.1 Heparin Dosing Weight: 68.9 kg  Vital Signs: Temp: 98.8 F (37.1 C) (08/04 2028) Temp Source: Oral (08/04 2028) BP: 118/54 (08/04 2028) Pulse Rate: 87 (08/04 2028)  Labs: Recent Labs    02/12/19 1026  02/12/19 2104 02/13/19 0252 02/13/19 1038 02/13/19 1915  HGB 10.6*  --   --  9.1*  --   --   HCT 31.8*  --   --  27.5*  --   --   PLT 201  --   --  185  --   --   LABPROT  --   --  13.8  --   --   --   INR  --   --  1.1  --   --   --   HEPARINUNFRC  --    < > 0.53 0.19* 0.28* 0.41  CREATININE 0.78  --   --  0.76  --   --    < > = values in this interval not displayed.    Estimated Creatinine Clearance: 75 mL/min (by C-G formula based on SCr of 0.76 mg/dL).   Medical History: Past Medical History:  Diagnosis Date  . Allergy   . Anxiety   . Arthritis   . Asthma   . Complication of anesthesia   . COPD (chronic obstructive pulmonary disease) (H[facility])   . Emphysema of lung ([facility])   . GERD (gastroesophageal reflux disease)   . History of hiatal hernia   . Hypertension   . Hyperthyroidism    h/o  . Neuromuscular disorder ([facility])   . PONV (postoperative nausea and vomiting)   . Thyroid disease     Medications:  Scheduled:  . albuterol  3 mL Inhalation BID  . ascorbic acid  100 mg Oral Daily  . cholecalciferol  1,000 Units Oral Daily  . ketorolac  30 mg Intravenous Q6H  . loratadine  10 mg Oral Daily  . metoprolol tartrate  25 mg Oral BID  . mometasone-formoterol  2 puff Inhalation BID  . multivitamin with minerals  1 tablet Oral Daily  . nicotine   21 mg Transdermal Daily  . tiotropium  18 mcg Inhalation Daily  . triamcinolone  1 spray Nasal Daily  . vitamin E  100 Units Oral Daily   Infusions:  . heparin 1,250 Units/hr (02/13/19 1352)    Assessment: 58 yo F to start Heparin drip for Brachiocephalic dissection. Patient just discharged 02/09/2019 after right upper extremity endovascular intervention . Patient on Plavix/ASA PTA per Med Rec Hgb 10.6  Plt 201  INR pending  APTT was ordered but specimen was hemolyzed and Heparin drip already started.  8/3 Heparin infusion started @ 850 units/hr 8/3 @ 2100 HL :0.53- Level therapeutic x 1.  8/4 HL: 0.19. Level is subtherapeutic.bolus 1000 untis, rate increase to 1000 units/hr. 8/4 1038 HL 0.28 1000 unit bolus and increase rate to 1250 unit/hr 8/4 1915 HL 0.41  Goal of Therapy:  Heparin level 0.3-0.7 units/ml Monitor platelets by anticoagulation protocol: Yes   Plan:  Heparin level is therapeutic.Will continue drip at 1250 units/hr  Recheck  HL 6 hours and CBC. Hgb and Plt slightly trending down. Continue to monitor.   Oswald Hillock, PharmD, BCPS Clinical Pharmacist 02/13/2019 8:30 PM

## 2019-02-13 NOTE — Progress Notes (Signed)
[facility] Daily Progress Note   Subjective: Patient with some continued right upper extremity pain however she states her discomfort has improved.  Objective: Vitals:   02/13/19 0507 02/13/19 0716 02/13/19 1057 02/13/19 1349  BP: 125/82  123/63 105/64  Pulse: 77 81 87 84  Resp: 20 18    Temp: 98.6 F (37 C)   97.9 F (36.6 C)  TempSrc: Oral   Oral  SpO2: 94% 98%  95%  Weight:      Height:        Intake/Output Summary (Last 24 hours) at 02/13/2019 1655 Last data filed at 02/13/2019 1354 Gross per 24 hour  Intake 337.11 ml  Output 1500 ml  Net -1162.89 ml   Physical Exam: A&Ox3, NAD CV: RRR Pulmonary: CTA Bilaterally Abdomen: Soft, Nontender, Nondistended Vascular:  Right upper extremity: Extremities warm distally to the fingers.  Good capillary refill.  Ecchymosis starting to resolve the wrist access site.   Laboratory: CBC    Component Value Date/Time   WBC 9.7 02/13/2019 0252   HGB 9.1 (L) 02/13/2019 0252   HGB 13.2 12/13/2018 1552   HCT 27.5 (L) 02/13/2019 0252   HCT 37.6 12/13/2018 1552   PLT 185 02/13/2019 0252   PLT 235 12/13/2018 1552   BMET    Component Value Date/Time   NA 137 02/13/2019 0252   NA 141 12/13/2018 1552   NA 136 03/19/2014 1818   K 4.0 02/13/2019 0252   K 3.0 (L) 03/19/2014 1818   CL 104 02/13/2019 0252   CL 101 03/19/2014 1818   CO2 25 02/13/2019 0252   CO2 24 03/19/2014 1818   GLUCOSE 95 02/13/2019 0252   GLUCOSE 139 (H) 03/19/2014 1818   BUN 13 02/13/2019 0252   BUN 8 12/13/2018 1552   BUN 10 03/19/2014 1818   CREATININE 0.76 02/13/2019 0252   CREATININE 0.86 03/19/2014 1818   CALCIUM 8.4 (L) 02/13/2019 0252   CALCIUM 10.4 (H) 03/19/2014 1818   GFRNONAA >60 02/13/2019 0252   GFRNONAA >60 03/19/2014 1818   GFRAA >60 02/13/2019 0252   GFRAA >60 03/19/2014 1818   Assessment/Planning: The patient is a 58 year old female status post a right upper extremity endovascular intervention 1) will transition  to aspirin and Plavix tomorrow 2) if pain improves and physical exam remains unremarkable plan to discharge home tomorrow 3) plan to follow-up with CTA of right upper extremity in 4 to 6 weeks  Discussed / seen with Dr. Eber Hong Homero Hyson PA-C 02/13/2019 4:55 PM

## 2019-02-13 NOTE — Progress Notes (Signed)
ANTICOAGULATION CONSULT NOTE - Initial Consult  Pharmacy Consult for Heparin Drip  Indication: Brachiocephalic dissection-arterial   Allergies  Allergen Reactions  . Nsaids Other (See Comments)    irritates stomach severley   . Other     Steroids-muscle weakness and headache  . Sular [Nisoldipine Er] Palpitations    Patient Measurements: Height: 5\' 2"  (157.5 cm) Weight: 184 lb (83.5 kg) IBW/kg (Calculated) : 50.1 Heparin Dosing Weight: 68.9 kg  Vital Signs: Temp: 98.2 F (36.8 C) (08/03 2216) Temp Source: Oral (08/03 2216) BP: 121/77 (08/03 2216) Pulse Rate: 91 (08/03 2216)  Labs: Recent Labs    02/12/19 1026 02/12/19 2104 02/13/19 0252  HGB 10.6*  --  9.1*  HCT 31.8*  --  27.5*  PLT 201  --  185  LABPROT  --  13.8  --   INR  --  1.1  --   HEPARINUNFRC  --  0.53 0.19*  CREATININE 0.78  --  0.76    Estimated Creatinine Clearance: 75 mL/min (by C-G formula based on SCr of 0.76 mg/dL).   Medical History: Past Medical History:  Diagnosis Date  . Allergy   . Anxiety   . Arthritis   . Asthma   . Complication of anesthesia   . COPD (chronic obstructive pulmonary disease) ([place])   . Emphysema of lung ([place])   . GERD (gastroesophageal reflux disease)   . History of hiatal hernia   . Hypertension   . Hyperthyroidism    h/o  . Neuromuscular disorder ([place])   . PONV (postoperative nausea and vomiting)   . Thyroid disease     Medications:  Scheduled:  . albuterol  3 mL Inhalation BID  . ascorbic acid  100 mg Oral Daily  . cholecalciferol  1,000 Units Oral Daily  . ketorolac  30 mg Intravenous Q6H  . loratadine  10 mg Oral Daily  . metoprolol tartrate  25 mg Oral BID  . mometasone-formoterol  2 puff Inhalation BID  . multivitamin with minerals  1 tablet Oral Daily  . nicotine  21 mg Transdermal Daily  . tiotropium  18 mcg Inhalation Daily  . triamcinolone  1 spray Nasal Daily  . vitamin E  100 Units Oral Daily   Infusions:  . heparin 850 Units/hr  (02/12/19 1335)    Assessment: 58 yo F to start Heparin drip for Brachiocephalic dissection. Patient just discharged 02/09/2019 after right upper extremity endovascular intervention . Patient on Plavix/ASA PTA per Med Rec Hgb 10.6  Plt 201  INR pending  APTT was ordered but specimen was hemolyzed and Heparin drip already started.  8/3 Heparin infusion started @ 850 units/hr 8/3 @ 2100 HL :0.53- Level therapeutic x 1.   Goal of Therapy:  Heparin level 0.3-0.7 units/ml Monitor platelets by anticoagulation protocol: Yes   Plan:  8/4 HL: 0.19. Level is subtherapeutic. Confirmed with RN that heparin infusion has been running without interruption.   Will order heparin 1000 unit bolus and increase heparin infusion to 1000 units/hr.   Recheck HL 6 hours after infusion rate change.   Pernell Dupre, PharmD, BCPS Clinical Pharmacist 02/13/2019 3:58 AM
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
## 2019-02-13 NOTE — Plan of Care (Signed)
Patient still having quite a bit of pain in the right arm and shoulder.  She has ambulated in the hallway.  Heparin rate was increased today.  No significant changes.

## 2019-02-13 NOTE — Progress Notes (Signed)
ANTICOAGULATION CONSULT NOTE - Initial Consult  Pharmacy Consult for Heparin Drip  Indication: Brachiocephalic dissection-arterial   Allergies  Allergen Reactions  . Nsaids Other (See Comments)    irritates stomach severley   . Other     Steroids-muscle weakness and headache  . Sular [Nisoldipine Er] Palpitations    Patient Measurements: Height: 5\' 2"  (157.5 cm) Weight: 184 lb (83.5 kg) IBW/kg (Calculated) : 50.1 Heparin Dosing Weight: 68.9 kg  Vital Signs: Temp: 98.6 F (37 C) (08/04 0507) Temp Source: Oral (08/04 0507) BP: 123/63 (08/04 1057) Pulse Rate: 87 (08/04 1057)  Labs: Recent Labs    02/12/19 1026 02/12/19 2104 02/13/19 0252 02/13/19 1038  HGB 10.6*  --  9.1*  --   HCT 31.8*  --  27.5*  --   PLT 201  --  185  --   LABPROT  --  13.8  --   --   INR  --  1.1  --   --   HEPARINUNFRC  --  0.53 0.19* 0.28*  CREATININE 0.78  --  0.76  --     Estimated Creatinine Clearance: 75 mL/min (by C-G formula based on SCr of 0.76 mg/dL).   Medical History: Past Medical History:  Diagnosis Date  . Allergy   . Anxiety   . Arthritis   . Asthma   . Complication of anesthesia   . COPD (chronic obstructive pulmonary disease) ([place])   . Emphysema of lung ([place])   . GERD (gastroesophageal reflux disease)   . History of hiatal hernia   . Hypertension   . Hyperthyroidism    h/o  . Neuromuscular disorder ([place])   . PONV (postoperative nausea and vomiting)   . Thyroid disease     Medications:  Scheduled:  . albuterol  3 mL Inhalation BID  . ascorbic acid  100 mg Oral Daily  . cholecalciferol  1,000 Units Oral Daily  . heparin  1,000 Units Intravenous Once  . ketorolac  30 mg Intravenous Q6H  . loratadine  10 mg Oral Daily  . metoprolol tartrate  25 mg Oral BID  . mometasone-formoterol  2 puff Inhalation BID  . multivitamin with minerals  1 tablet Oral Daily  . nicotine  21 mg Transdermal Daily  . tiotropium  18 mcg Inhalation Daily  . triamcinolone  1 spray  Nasal Daily  . vitamin E  100 Units Oral Daily   Infusions:  . heparin 1,000 Units/hr (02/13/19 0423)    Assessment: 58 yo F to start Heparin drip for Brachiocephalic dissection. Patient just discharged 02/09/2019 after right upper extremity endovascular intervention . Patient on Plavix/ASA PTA per Med Rec Hgb 10.6  Plt 201  INR pending  APTT was ordered but specimen was hemolyzed and Heparin drip already started.  8/3 Heparin infusion started @ 850 units/hr 8/3 @ 2100 HL :0.53- Level therapeutic x 1.  8/4 HL: 0.19. Level is subtherapeutic.bolus 1000 untis, rate increase to 1000 units/hr.  Goal of Therapy:  Heparin level 0.3-0.7 units/ml Monitor platelets by anticoagulation protocol: Yes   Plan:  8/4 HL: 0.28. Level is slightly subtherapeutic.Will order 1000 unit bolus and increase drip to 1250 units/hr  Recheck HL 6 hours after infusion rate change.   Noralee Space, PharmD, BCPS Clinical Pharmacist 02/13/2019 12:47 PM

## 2019-02-14 ENCOUNTER — Other Ambulatory Visit (INDEPENDENT_AMBULATORY_CARE_PROVIDER_SITE_OTHER): Payer: Self-pay | Admitting: Vascular Surgery

## 2019-02-14 DIAGNOSIS — I7776 Dissection of artery of upper extremity: Secondary | ICD-10-CM

## 2019-02-14 LAB — CBC
HCT: 28.8 % — ABNORMAL LOW (ref 36.0–46.0)
Hemoglobin: 9.5 g/dL — ABNORMAL LOW (ref 12.0–15.0)
MCH: 30.8 pg (ref 26.0–34.0)
MCHC: 33 g/dL (ref 30.0–36.0)
MCV: 93.5 fL (ref 80.0–100.0)
Platelets: 191 10*3/uL (ref 150–400)
RBC: 3.08 MIL/uL — ABNORMAL LOW (ref 3.87–5.11)
RDW: 13.3 % (ref 11.5–15.5)
WBC: 8 10*3/uL (ref 4.0–10.5)
nRBC: 0 % (ref 0.0–0.2)

## 2019-02-14 LAB — BASIC METABOLIC PANEL
Anion gap: 10 (ref 5–15)
BUN: 14 mg/dL (ref 6–20)
CO2: 24 mmol/L (ref 22–32)
Calcium: 8.4 mg/dL — ABNORMAL LOW (ref 8.9–10.3)
Chloride: 105 mmol/L (ref 98–111)
Creatinine, Ser: 0.86 mg/dL (ref 0.44–1.00)
GFR calc Af Amer: >60 mL/min (ref >60)
GFR calc non Af Amer: >60 mL/min (ref >60)
Glucose, Bld: 131 mg/dL — ABNORMAL HIGH (ref 70–99)
Potassium: 3.6 mmol/L (ref 3.5–5.1)
Sodium: 139 mmol/L (ref 135–145)

## 2019-02-14 LAB — HEPARIN LEVEL (UNFRACTIONATED)
Heparin Unfractionated: 0.56 IU/mL (ref 0.30–0.70)
Heparin Unfractionated: 0.61 IU/mL (ref 0.30–0.70)

## 2019-02-14 LAB — MAGNESIUM: Magnesium: 2.1 mg/dL (ref 1.7–2.4)

## 2019-02-14 MED ORDER — OXYCODONE HCL 5 MG PO TABS: ORAL_TABLET | ORAL | 0 refills | Status: DC

## 2019-02-14 MED ORDER — IPRATROPIUM-ALBUTEROL 0.5-2.5 (3) MG/3ML IN SOLN
3.0000 mL | Freq: Three times a day (TID) | RESPIRATORY_TRACT | Status: DC
  Administered 2019-02-14: 3 mL via RESPIRATORY_TRACT
  Filled 2019-02-14: qty 3

## 2019-02-14 MED ORDER — METOPROLOL TARTRATE 25 MG PO TABS: 25.0000 mg | ORAL_TABLET | Freq: Two times a day (BID) | ORAL | 6 refills | Status: DC

## 2019-02-14 MED ORDER — ALBUTEROL SULFATE (2.5 MG/3ML) 0.083% IN NEBU: 3.0000 mL | INHALATION_SOLUTION | RESPIRATORY_TRACT | Status: DC | PRN

## 2019-02-14 MED ORDER — METOPROLOL TARTRATE 25 MG PO TABS: 25.0000 mg | ORAL_TABLET | Freq: Two times a day (BID) | ORAL | 0 refills | Status: DC

## 2019-02-14 MED ORDER — CLOPIDOGREL BISULFATE 75 MG PO TABS
75.0000 mg | ORAL_TABLET | Freq: Every day | ORAL | Status: DC
  Administered 2019-02-14: 75 mg via ORAL
  Filled 2019-02-14: qty 1

## 2019-02-14 MED ORDER — ASPIRIN EC 81 MG PO TBEC
81.0000 mg | DELAYED_RELEASE_TABLET | Freq: Every day | ORAL | Status: DC
  Administered 2019-02-14: 81 mg via ORAL
  Filled 2019-02-14: qty 1
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
## 2019-02-14 NOTE — Discharge Instructions (Signed)
A CT of the chest has been ordered. [facility] radi[facility] department will be contacting you to make an appointment in approximately 1 month for your CT scan. Once you have the date of your CT scan please call our office and make an appointment with Dr. Delana Meyer to review the CT. Please contact the physician who manages your Suboxone to restart it safely.
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
## 2019-02-14 NOTE — Progress Notes (Signed)
MD ordered patient to be discharged home.  Discharge instructions were reviewed with the patient and she voiced understanding.  Follow-up appointment was made.  Prescriptions sent to the patients pharmacy.  IV was removed with catheter intact.  All patients questions were answered.  Patient left via wheelchair escorted by NT.  

## 2019-02-14 NOTE — Progress Notes (Signed)
ANTICOAGULATION CONSULT NOTE - follow up  Pharmacy Consult for Heparin Drip  Indication: Brachiocephalic dissection-arterial   Allergies  Allergen Reactions  . Nsaids Other (See Comments)    irritates stomach severley   . Other     Steroids-muscle weakness and headache  . Sular [Nisoldipine Er] Palpitations   Patient Measurements: Height: 5\' 2"  (157.5 cm) Weight: 184 lb (83.5 kg) IBW/kg (Calculated) : 50.1 Heparin Dosing Weight: 68.9 kg  Vital Signs: Temp: 97.7 F (36.5 C) (08/05 0459) Temp Source: Oral (08/05 0459) BP: 121/69 (08/05 0920) Pulse Rate: 77 (08/05 0920)  Labs: Recent Labs    02/12/19 1026  02/12/19 2104 02/13/19 0252  02/13/19 1915 02/14/19 0119 02/14/19 0824  HGB 10.6*  --   --  9.1*  --   --  9.5*  --   HCT 31.8*  --   --  27.5*  --   --  28.8*  --   PLT 201  --   --  185  --   --  191  --   LABPROT  --   --  13.8  --   --   --   --   --   INR  --   --  1.1  --   --   --   --   --   HEPARINUNFRC  --    < > 0.53 0.19*   < > 0.41 0.56 0.61  CREATININE 0.78  --   --  0.76  --   --  0.86  --    < > = values in this interval not displayed.    Estimated Creatinine Clearance: 69.7 mL/min (by C-G formula based on SCr of 0.86 mg/dL).  Medical History: Past Medical History:  Diagnosis Date  . Allergy   . Anxiety   . Arthritis   . Asthma   . Complication of anesthesia   . COPD (chronic obstructive pulmonary disease) ([facility])   . Emphysema of lung ([facility])   . GERD (gastroesophageal reflux disease)   . History of hiatal hernia   . Hypertension   . Hyperthyroidism    h/o  . Neuromuscular disorder ([facility])   . PONV (postoperative nausea and vomiting)   . Thyroid disease     Medications:  Scheduled:  . ascorbic acid  100 mg Oral Daily  . cholecalciferol  1,000 Units Oral Daily  . ipratropium-albuterol  3 mL Nebulization TID  . ketorolac  30 mg Intravenous Q6H  . loratadine  10 mg Oral Daily  . metoprolol tartrate  25 mg Oral BID  .  mometasone-formoterol  2 puff Inhalation BID  . multivitamin with minerals  1 tablet Oral Daily  . nicotine  21 mg Transdermal Daily  . tiotropium  18 mcg Inhalation Daily  . triamcinolone  1 spray Nasal Daily  . vitamin E  100 Units Oral Daily   Infusions:  . heparin 1,250 Units/hr (02/14/19 [id number])   Assessment: [age] yo F to start Heparin drip for Brachiocephalic dissection. Patient just discharged 02/09/2019 after right upper extremity endovascular intervention . Patient on Plavix/ASA PTA per Med Rec Hgb 9.5 Hct 28.8  Plt 191  8/3 Heparin infusion started @ 850 units/hr 8/3 @ 2100 HL :0.53- Level therapeutic x 1.  8/4 HL: 0.19. Level is subtherapeutic.bolus 1000 untis, rate increase to 1000 units/hr. 8/4 1038 HL 0.28 1000 unit bolus and increase rate to 1250 unit/hr 8/4 1915 HL 0.41 8/5 0119 HL 0.56 8/6 0824 HL 0.61  Goal of  Therapy:  Heparin level 0.3-0.7 units/ml Monitor platelets by anticoagulation protocol: Yes   Plan:   8/6 0824 HL 0.61 (therapeutic). Heparin level is therapeutic > 2 consecutive checks.  Will continue drip at 1250 units/hr.  Recheck HL and CBC daily. H/H low but stable, PLTs stable.  Continue to monitor.   Gerald Dexter, PharmD Pharmacy Resident  02/14/2019 9:38 AM

## 2019-02-14 NOTE — Progress Notes (Signed)
ANTICOAGULATION CONSULT NOTE - follow up  Pharmacy Consult for Heparin Drip  Indication: Brachiocephalic dissection-arterial   Allergies  Allergen Reactions  . Nsaids Other (See Comments)    irritates stomach severley   . Other     Steroids-muscle weakness and headache  . Sular [Nisoldipine Er] Palpitations   Patient Measurements: Height: 5\' 2"  (157.5 cm) Weight: 184 lb (83.5 kg) IBW/kg (Calculated) : 50.1 Heparin Dosing Weight: 68.9 kg  Vital Signs: Temp: 98.8 F (37.1 C) (08/04 2028) Temp Source: Oral (08/04 2028) BP: 118/54 (08/04 2028) Pulse Rate: 87 (08/04 2028)  Labs: Recent Labs    02/12/19 1026  02/12/19 2104 02/13/19 0252 02/13/19 1038 02/13/19 1915 02/14/19 0119  HGB 10.6*  --   --  9.1*  --   --  9.5*  HCT 31.8*  --   --  27.5*  --   --  28.8*  PLT 201  --   --  185  --   --  191  LABPROT  --   --  13.8  --   --   --   --   INR  --   --  1.1  --   --   --   --   HEPARINUNFRC  --    < > 0.53 0.19* 0.28* 0.41 0.56  CREATININE 0.78  --   --  0.76  --   --  0.86   < > = values in this interval not displayed.    Estimated Creatinine Clearance: 69.7 mL/min (by C-G formula based on SCr of 0.86 mg/dL).  Medical History: Past Medical History:  Diagnosis Date  . Allergy   . Anxiety   . Arthritis   . Asthma   . Complication of anesthesia   . COPD (chronic obstructive pulmonary disease) ([facility])   . Emphysema of lung ([facility])   . GERD (gastroesophageal reflux disease)   . History of hiatal hernia   . Hypertension   . Hyperthyroidism    h/o  . Neuromuscular disorder ([facility])   . PONV (postoperative nausea and vomiting)   . Thyroid disease     Medications:  Scheduled:  . albuterol  3 mL Inhalation BID  . ascorbic acid  100 mg Oral Daily  . cholecalciferol  1,000 Units Oral Daily  . ketorolac  30 mg Intravenous Q6H  . loratadine  10 mg Oral Daily  . metoprolol tartrate  25 mg Oral BID  . mometasone-formoterol  2 puff Inhalation BID  . multivitamin with  minerals  1 tablet Oral Daily  . nicotine  21 mg Transdermal Daily  . tiotropium  18 mcg Inhalation Daily  . triamcinolone  1 spray Nasal Daily  . vitamin E  100 Units Oral Daily   Infusions:  . heparin 1,250 Units/hr (02/13/19 1352)   Assessment: 58 yo F to start Heparin drip for Brachiocephalic dissection. Patient just discharged 02/09/2019 after right upper extremity endovascular intervention . Patient on Plavix/ASA PTA per Med Rec Hgb 10.6  Plt 201  INR pending  APTT was ordered but specimen was hemolyzed and Heparin drip already started.  8/3 Heparin infusion started @ 850 units/hr 8/3 @ 2100 HL :0.53- Level therapeutic x 1.  8/4 HL: 0.19. Level is subtherapeutic.bolus 1000 untis, rate increase to 1000 units/hr. 8/4 1038 HL 0.28 1000 unit bolus and increase rate to 1250 unit/hr 8/4 1915 HL 0.41 8/5 0119 HL 0.56  Goal of Therapy:  Heparin level 0.3-0.7 units/ml Monitor platelets by anticoagulation protocol: Yes  Plan:  Heparin level is therapeutic x 2 consecutive checks.  Will continue drip at 1250 units/hr  Recheck HL and CBC daily. H/H low but stable, PLTs stable.  Continue to monitor.   Ena Dawley, PharmD Clinical Pharmacist 02/14/2019 2:31 AM

## 2019-02-17 LAB — CULTURE, BLOOD (ROUTINE X 2)
Culture: NO GROWTH
Culture: NO GROWTH
Special Requests: ADEQUATE

## 2019-02-19 DIAGNOSIS — M7521 Bicipital tendinitis, right shoulder: Secondary | ICD-10-CM | POA: Insufficient documentation

## 2019-02-19 DIAGNOSIS — M5412 Radiculopathy, cervical region: Secondary | ICD-10-CM | POA: Diagnosis not present

## 2019-02-26 ENCOUNTER — Encounter (INDEPENDENT_AMBULATORY_CARE_PROVIDER_SITE_OTHER): Payer: Self-pay | Admitting: Vascular Surgery

## 2019-02-26 ENCOUNTER — Other Ambulatory Visit: Payer: Self-pay

## 2019-02-26 ENCOUNTER — Ambulatory Visit (INDEPENDENT_AMBULATORY_CARE_PROVIDER_SITE_OTHER): Payer: Medicaid Other | Admitting: Vascular Surgery

## 2019-02-26 VITALS — BP 121/83 | HR 80 | Resp 16 | Wt 182.8 lb

## 2019-02-26 DIAGNOSIS — I7776 Dissection of artery of upper extremity: Secondary | ICD-10-CM | POA: Diagnosis not present

## 2019-02-26 DIAGNOSIS — J449 Chronic obstructive pulmonary disease, unspecified: Secondary | ICD-10-CM

## 2019-02-26 DIAGNOSIS — I10 Essential (primary) hypertension: Secondary | ICD-10-CM

## 2019-02-26 DIAGNOSIS — S25101S Unspecified injury of right innominate or subclavian artery, sequela: Secondary | ICD-10-CM

## 2019-03-11 NOTE — Progress Notes (Signed)
MRN : [id number]  Christy Mason is a 58 y.o. (06/10/1961) female who presents with chief complaint of  Chief Complaint  Patient presents with  . Follow-up    [facility] 2week  .  History of Present Illness:   The patient returns to the office for followup and review status post angiogram with intervention. The patient notes improvement in the lower extremity symptoms. No interval shortening of the patient's claudication distance or rest pain symptoms. Previous wounds have now healed.  No new ulcers or wounds have occurred since the last visit.  There have been no significant changes to the patient's overall health care.  The patient denies amaurosis fugax or recent TIA symptoms. There are no recent neurological changes noted. The patient denies history of DVT, PE or superficial thrombophlebitis. The patient denies recent episodes of angina or shortness of breath.     Current Meds  Medication Sig  . ADVAIR DISKUS 250-50 MCG/DOSE AEPB Inhale 1 puff into the lungs 2 (two) times a day. INL 1 PUFF PO BID  . Ascorbic Acid (VITAMIN C) 100 MG tablet Take 100 mg by mouth daily.   Marland Kitchen aspirin EC 81 MG tablet Take 81 mg by mouth daily.  . cetirizine (ZYRTEC) 10 MG tablet Take 10 mg by mouth daily.  . cholecalciferol (VITAMIN D3) 25 MCG (1000 UT) tablet Take 1,000 Units by mouth daily.  . clopidogrel (PLAVIX) 75 MG tablet Take 1 tablet (75 mg total) by mouth daily.  Marland Kitchen lubiprostone (AMITIZA) 24 MCG capsule Take 1 capsule (24 mcg total) by mouth 2 (two) times daily with a meal. (Patient taking differently: Take 24 mcg by mouth 2 (two) times daily as needed (constipation.). )  . metoprolol tartrate (LOPRESSOR) 25 MG tablet Take 1 tablet (25 mg total) by mouth 2 (two) times daily.  . Multiple Vitamin (MULTIVITAMIN WITH MINERALS) TABS tablet Take 1 tablet by mouth daily.  Marland Kitchen oxyCODONE (OXY IR/ROXICODONE) 5 MG immediate release tablet One to Two Tabs Every Six Hours As Needed For Pain  . PROAIR HFA 108 (90  Base) MCG/ACT inhaler INHALE 2 PUFFS INTO THE LUNGS EVERY 6 HOURS AS NEEDED FOR WHEEZING OR SHORTNESS OF BREATH (Patient taking differently: Inhale 2 puffs into the lungs 2 (two) times daily. )  . SPIRIVA HANDIHALER 18 MCG inhalation capsule Place 1 capsule (18 mcg total) into inhaler and inhale daily.  . SUBOXONE 8-2 MG FILM Take 0.5 Film by mouth See admin instructions. Take 0.5 film by mouth scheduled in the morning, may take other 0.5 film if needed for pain.  Marland Kitchen triamcinolone (NASACORT ALLERGY 24HR) 55 MCG/ACT AERO nasal inhaler Place 1 spray into the nose daily.  . vitamin E 100 UNIT capsule Take 100 Units by mouth daily.     Past Medical History:  Diagnosis Date  . Allergy   . Anxiety   . Arthritis   . Asthma   . Complication of anesthesia   . COPD (chronic obstructive pulmonary disease) (HCC)   . Emphysema of lung (HCC)   . GERD (gastroesophageal reflux disease)   . History of hiatal hernia   . Hypertension   . Hyperthyroidism    h/o  . Neuromuscular disorder (HCC)   . PONV (postoperative nausea and vomiting)   . Thyroid disease     Past Surgical History:  Procedure Laterality Date  . CARPAL TUNNEL RELEASE Right   . CHOLECYSTECTOMY    . FRACTURE SURGERY Right    plates  . FUNCTIONAL ENDOSCOPIC SINUS  SURGERY    . SALPINGOOPHORECTOMY Right   . TUBAL LIGATION    . UPPER EXTREMITY ANGIOGRAPHY Right 01/23/2019   Procedure: UPPER EXTREMITY ANGIOGRAPHY;  Surgeon: Renford DillsSchnier, Onesha Krebbs G, MD;  Location: [facility] INVASIVE CV LAB;  Service: Cardiovascular;  Laterality: Right;  . UPPER EXTREMITY ANGIOGRAPHY Right 02/07/2019   Procedure: UPPER EXTREMITY ANGIOGRAPHY;  Surgeon: Renford DillsSchnier, Jennifermarie Franzen G, MD;  Location: [facility] INVASIVE CV LAB;  Service: Cardiovascular;  Laterality: Right;    Social History Social History   Tobacco Use  . Smoking status: Current Every Day Smoker    Packs/day: 1.00    Years: 25.00    Pack years: 25.00    Types: Cigarettes  . Smokeless tobacco: Never Used   Substance Use Topics  . Alcohol use: Not Currently  . Drug use: Not Currently    Family History Family History  Problem Relation Age of Onset  . Depression Mother   . Hypertension Mother   . COPD Mother   . Diabetes Father   . Hypertension Father     Allergies  Allergen Reactions  . Acetaminophen   . Nsaids Other (See Comments)    irritates stomach severley   . Other     Steroids-muscle weakness and headache  . Sular [Nisoldipine Er] Palpitations     REVIEW OF SYSTEMS (Negative unless checked)  Constitutional: [] Weight loss  [] Fever  [] Chills Cardiac: [] Chest pain   [] Chest pressure   [] Palpitations   [] Shortness of breath when laying flat   [] Shortness of breath with exertion. Vascular:  [] Pain in legs with walking   [] Pain in legs at rest  [] History of DVT   [] Phlebitis   [] Swelling in legs   [] Varicose veins   [] Non-healing ulcers Pulmonary:   [] Uses home oxygen   [] Productive cough   [] Hemoptysis   [] Wheeze  [] COPD   [] Asthma Neurologic:  [] Dizziness   [] Seizures   [] History of stroke   [] History of TIA  [] Aphasia   [] Vissual changes   [] Weakness or numbness in arm   [] Weakness or numbness in leg Musculoskeletal:   [] Joint swelling   [] Joint pain   [] Low back pain Hematologic:  [] Easy bruising  [] Easy bleeding   [] Hypercoagulable state   [] Anemic Gastrointestinal:  [] Diarrhea   [] Vomiting  [] Gastroesophageal reflux/heartburn   [] Difficulty swallowing. Genitourinary:  [] Chronic kidney disease   [] Difficult urination  [] Frequent urination   [] Blood in urine Skin:  [] Rashes   [] Ulcers  Psychological:  [] History of anxiety   []  History of major depression.  Physical Examination  Vitals:   02/26/19 1332  BP: 121/83  Pulse: 80  Resp: 16  Weight: 182 lb 12.8 oz (82.9 kg)   Body mass index is 33.43 kg/m. Gen: WD/WN, NAD Head: [place]/AT, No temporalis wasting.  Ear/Nose/Throat: Hearing grossly intact, nares w/o erythema or drainage Eyes: PER, EOMI, sclera nonicteric.   Neck: Supple, no large masses.   Pulmonary:  Good air movement, no audible wheezing bilaterally, no use of accessory muscles.  Cardiac: RRR, no JVD Vascular:  Vessel Right Left  Radial Palpable Palpable  Brachial Palpable Palpable  Carotid Palpable Palpable  Gastrointestinal: Non-distended. No guarding/no peritoneal signs.  Musculoskeletal: M/S 5/5 throughout.  No deformity or atrophy.  Neurologic: CN 2-12 intact. Symmetrical.  Speech is fluent. Motor exam as listed above. Psychiatric: Judgment intact, Mood & affect appropriate for pt's clinical situation. Dermatologic: No rashes or ulcers noted.  No changes consistent with cellulitis. Lymph : No lichenification or skin changes of chronic lymphedema.  CBC Lab Results  Component Value Date   WBC 8.0 02/14/2019   HGB 9.5 (L) 02/14/2019   HCT 28.8 (L) 02/14/2019   MCV 93.5 02/14/2019   PLT 191 02/14/2019    BMET    Component Value Date/Time   NA 139 02/14/2019 0119   NA 141 12/13/2018 1552   NA 136 03/19/2014 1818   K 3.6 02/14/2019 0119   K 3.0 (L) 03/19/2014 1818   CL 105 02/14/2019 0119   CL 101 03/19/2014 1818   CO2 24 02/14/2019 0119   CO2 24 03/19/2014 1818   GLUCOSE 131 (H) 02/14/2019 0119   GLUCOSE 139 (H) 03/19/2014 1818   BUN 14 02/14/2019 0119   BUN 8 12/13/2018 1552   BUN 10 03/19/2014 1818   CREATININE 0.86 02/14/2019 0119   CREATININE 0.86 03/19/2014 1818   CALCIUM 8.4 (L) 02/14/2019 0119   CALCIUM 10.4 (H) 03/19/2014 1818   GFRNONAA >60 02/14/2019 0119   GFRNONAA >60 03/19/2014 1818   GFRAA >60 02/14/2019 0119   GFRAA >60 03/19/2014 1818   CrCl cannot be calculated (Patient's most recent lab result is older than the maximum 21 days allowed.).  COAG Lab Results  Component Value Date   INR 1.1 02/12/2019    Radiology Ct Angio Neck W And/or Wo Contrast  Result Date: 02/12/2019 CLINICAL DATA:  58 year old female with recent angioplasty and stent. New fever. EXAM: CT ANGIOGRAPHY NECK TECHNIQUE:  Multidetector CT imaging of the neck was performed using the standard protocol during bolus administration of intravenous contrast. Multiplanar CT image reconstructions and MIPs were obtained to evaluate the vascular anatomy. Carotid stenosis measurements (when applicable) are obtained utilizing NASCET criteria, using the distal internal carotid diameter as the denominator. CONTRAST:  70mL OMNIPAQUE IOHEXOL 350 MG/ML SOLN COMPARISON:  None. FINDINGS: Skeleton: Absent dentition. Widespread cervical spine disc and endplate degeneration. Chronic appearing deformity of the distal right clavicle. No acute osseous abnormality identified. Upper chest: Mild atelectasis in the visible medial right upper lobe. There is centrilobular emphysema. No superior mediastinal lymphadenopathy. Other neck: Negative thyroid, larynx, pharynx, parapharyngeal spaces, retropharyngeal space (retropharyngeal course of the left carotid, normal variant), sublingual space, submandibular spaces and parotid spaces. Visualized orbit soft tissues are within normal limits. Negative visible brain parenchyma. Previous bilateral paranasal sinus surgery. Visualized paranasal sinuses and mastoids are well pneumatized. Aortic arch: 3 vessel arch configuration. There is arterial dissection of the brachiocephalic artery (series 4, image 84), and also the at the level of the left CCA origin tracking to the left subclavian artery origin, but not into the left carotid or subclavian. The brachiocephalic artery dissection terminates at the bifurcation of the right CCA and subclavian which both seem to arise from the true lumen. There is a short segment dissection into the right subclavian best seen on series 7, image 93. Just beyond the right subclavian artery origin vascular stents are in place and are patent to the level of the right axillary artery. There is some in stent soft plaque or thrombus demonstrated on series 7, image 73. There is no dissection  extending distally into the descending aorta, or retrograde into the visible ascending aorta. Right carotid system: The right CCA arises from the true lumen of the brachiocephalic artery dissection and is widely patent. Minimal plaque at the right carotid bifurcation. No cervical right ICA stenosis. Visible right ICA siphon is negative. Left carotid system: There is a dissection along the distal margin of the left CCA origin on series 4, image 85, but not extending into the  vessel. The left CCA has a partially retropharyngeal course. Mild soft and calcified plaque at the left ICA origin without stenosis. Visible left ICA siphon is negative. Vertebral arteries: The right vertebral artery origin is at the margin of the right subclavian stent on coronal image 92 of series 7 and is poorly enhancing. The right vertebral appears non dominant and diminutive but does remain patent throughout the neck to the skull base. Intracranially the right PICA is patent. The right V4 distal to the PICA is diminutive. The vertebrobasilar junction is patent. Negative visible basilar artery. Arterial dissection along the anterior margin of the left subclavian artery origin, but not extending into the vessel. No proximal left CCA stenosis. The left vertebral artery origin is tortuous but otherwise within normal limits. The left V1 segment is tortuous. The left vertebral artery is dominant and patent to the vertebrobasilar junction without stenosis. Normal left PICA origin. Review of the MIP images confirms the above findings IMPRESSION: 1. 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 Both the right CCA and right subclavian arise from the true lumen. The dissection extends slightly into the right subclavian, but terminates proximal to right subclavian through axillary artery vascular stent(s)  which are patent. - dissection of the superior aortic arch which tracks between the left CCA and subclavian artery origins, but does not propagate into either vessel. - no dissection tracking into the ascending or descending aortic segments. 2. The right subclavian stent might partially cover the right vertebral artery origin, which is poorly enhancing. However, the right vertebral remains patent to the skull base though has a non dominant/diminutive appearance. 3. There is a small volume of right subclavian in-stent plaque or thrombus distally. 4. Mild cervical carotid atherosclerosis without stenosis. Visible ICA siphons are normal. 5. No arterial stenosis in the neck. The left vertebral artery is dominant and patent. 6. emphysema (ICD10-J43.9). #1 and 2 were discussed by telephone with Dr. Chesley NoonHARLES JESSUP on 02/12/2019 at 12:51 . Electronically Signed   By: Odessa FlemingH  Hall M.D.   On: 02/12/2019 12:59   Dg Chest Portable 1 View  Result Date: 02/12/2019 CLINICAL DATA:  Fever. EXAM: PORTABLE CHEST 1 VIEW COMPARISON:  06/23/2016 report. FINDINGS: Multiple stents are noted over the right subclavian region. A smaller stent is noted adjacent to the larger subclavian stents. Heart size normal. No pulmonary venous congestion. No focal infiltrate. No pleural effusion or pneumothorax. Prior plate screw fixation of multiple right ribs. IMPRESSION: No acute cardiopulmonary disease.  Right subclavian stents as above. 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  The patient does not voice lifestyle limiting changes at this point in time.  Noninvasive studies do not suggest clinically significant change.  No invasive studies, angiography or surgery at this time The patient should continue walking and begin a more formal exercise  program.  The patient should continue antiplatelet therapy and aggressive treatment of the lipid abnormalities  No changes in the patient's medications at this time  The patient should continue wearing graduated compression socks 10-15 mmHg strength to control the mild edema.   - CT Angio Chest W/Cm &/Or Wo Cm; Future  2. Essential hypertension Continue antihypertensive medications as already ordered, these medications have  been reviewed and there are no changes at this time.   3. Injury of right subclavian artery, sequela Recommend:  The patient is status post successful angiogram with intervention.  The patient reports that the claudication symptoms and leg pain is essentially gone.   The patient denies lifestyle limiting changes at this point in time.  No further invasive studies, angiography or surgery at this time The patient should continue walking and begin a more formal exercise program.  The patient should continue antiplatelet therapy and aggressive treatment of the lipid abnormalities  Smoking cessation was again discussed  Patient should undergo noninvasive studies as ordered. The patient will follow up with me after the studies.    4. Chronic obstructive pulmonary disease, unspecified COPD type (HCC) Continue pulmonary medications and aerosols as already ordered, these medications have been reviewed and there are no changes at this time.      Levora Dredge, MD  03/11/2019 2:22 PM

## 2019-03-12 DIAGNOSIS — M5414 Radiculopathy, thoracic region: Secondary | ICD-10-CM | POA: Insufficient documentation

## 2019-03-23 ENCOUNTER — Other Ambulatory Visit: Payer: Self-pay | Admitting: Physician Assistant

## 2019-03-23 DIAGNOSIS — J449 Chronic obstructive pulmonary disease, unspecified: Secondary | ICD-10-CM

## 2019-04-23 ENCOUNTER — Other Ambulatory Visit: Payer: Self-pay | Admitting: Physician Assistant

## 2019-04-23 DIAGNOSIS — J449 Chronic obstructive pulmonary disease, unspecified: Secondary | ICD-10-CM

## 2019-04-23 MED ORDER — ADVAIR DISKUS 250-50 MCG/DOSE IN AEPB: 1.0000 | INHALATION_SPRAY | Freq: Every day | RESPIRATORY_TRACT | 2 refills | Status: DC

## 2019-04-23 NOTE — Telephone Encounter (Signed)
[facility] faxed refill request for the following medications:  ADVAIR DISKUS 250-50 MCG/DOSE AEPB   Please advise.

## 2019-05-31 ENCOUNTER — Other Ambulatory Visit (INDEPENDENT_AMBULATORY_CARE_PROVIDER_SITE_OTHER): Payer: Self-pay | Admitting: Vascular Surgery

## 2019-05-31 ENCOUNTER — Other Ambulatory Visit: Payer: Self-pay

## 2019-05-31 ENCOUNTER — Ambulatory Visit
Admission: RE | Admit: 2019-05-31 | Discharge: 2019-05-31 | Disposition: A | Discharge status: Home / Self Care | Payer: Medicaid Other | Source: Ambulatory Visit | Attending: Vascular Surgery | Admitting: Vascular Surgery

## 2019-05-31 DIAGNOSIS — I7101 Dissection of thoracic aorta: Secondary | ICD-10-CM | POA: Diagnosis not present

## 2019-05-31 DIAGNOSIS — I7776 Dissection of artery of upper extremity: Secondary | ICD-10-CM

## 2019-05-31 LAB — POCT I-STAT CREATININE: Creatinine, Ser: 0.8 mg/dL (ref 0.44–1.00)

## 2019-05-31 MED ORDER — IOHEXOL 350 MG/ML SOLN
75.0000 mL | Freq: Once | INTRAVENOUS | Status: AC | PRN
  Administered 2019-05-31: 75 mL via INTRAVENOUS

## 2019-06-15 DIAGNOSIS — B349 Viral infection, unspecified: Secondary | ICD-10-CM | POA: Diagnosis not present

## 2019-06-15 DIAGNOSIS — Z20828 Contact with and (suspected) exposure to other viral communicable diseases: Secondary | ICD-10-CM | POA: Diagnosis not present

## 2019-06-22 DIAGNOSIS — Z20828 Contact with and (suspected) exposure to other viral communicable diseases: Secondary | ICD-10-CM | POA: Diagnosis not present

## 2019-07-19 ENCOUNTER — Other Ambulatory Visit: Payer: Self-pay | Admitting: Physician Assistant

## 2019-07-19 DIAGNOSIS — J449 Chronic obstructive pulmonary disease, unspecified: Secondary | ICD-10-CM

## 2019-07-20 NOTE — Telephone Encounter (Signed)
Requested Prescriptions  Pending Prescriptions Disp Refills  . PROAIR HFA 108 (90 Base) MCG/ACT inhaler [Pharmacy Med Name: PROAIR HFA ORAL INH (200  PFS) 8.5G] 17 g     Sig: INHALE 2 PUFFS INTO THE LUNGS EVERY 6 HOURS AS NEEDED FOR WHEEZING OR SHORTNESS OF BREATH     Pulmonology:  Beta Agonists Failed - 07/19/2019  8:31 PM      Failed - One inhaler should last at least one month. If the patient is requesting refills earlier, contact the patient to check for uncontrolled symptoms.      Passed - Valid encounter within last 12 months    Recent Outpatient Visits          6 months ago Annual physical exam   [facility] Osvaldo Angst M, [place]   7 months ago Chronic obstructive pulmonary disease, unspecified COPD type [facility])   [facility] [place], [place]ella Hammock, [place]

## 2019-09-06 ENCOUNTER — Other Ambulatory Visit: Payer: Self-pay

## 2019-09-06 ENCOUNTER — Encounter: Payer: Self-pay | Admitting: Physician Assistant

## 2019-09-06 ENCOUNTER — Ambulatory Visit: Payer: Medicaid Other | Admitting: Physician Assistant

## 2019-09-06 VITALS — BP 148/100 | HR 86 | Temp 96.8°F | Wt 191.4 lb

## 2019-09-06 DIAGNOSIS — R29818 Other symptoms and signs involving the nervous system: Secondary | ICD-10-CM | POA: Diagnosis not present

## 2019-09-06 DIAGNOSIS — Z720 Tobacco use: Secondary | ICD-10-CM | POA: Diagnosis not present

## 2019-09-06 DIAGNOSIS — I7776 Dissection of artery of upper extremity: Secondary | ICD-10-CM | POA: Diagnosis not present

## 2019-09-06 DIAGNOSIS — J449 Chronic obstructive pulmonary disease, unspecified: Secondary | ICD-10-CM | POA: Diagnosis not present

## 2019-09-06 DIAGNOSIS — F329 Major depressive disorder, single episode, unspecified: Secondary | ICD-10-CM | POA: Diagnosis not present

## 2019-09-06 DIAGNOSIS — I10 Essential (primary) hypertension: Secondary | ICD-10-CM

## 2019-09-06 DIAGNOSIS — F32A Depression, unspecified: Secondary | ICD-10-CM

## 2019-09-06 MED ORDER — NICOTINE 10 MG IN INHA: 1.0000 | RESPIRATORY_TRACT | 0 refills | Status: DC | PRN

## 2019-09-06 MED ORDER — LOSARTAN POTASSIUM-HCTZ 50-12.5 MG PO TABS: 1.0000 | ORAL_TABLET | Freq: Every day | ORAL | 1 refills | Status: DC

## 2019-09-06 MED ORDER — TRELEGY ELLIPTA 100-62.5-25 MCG/INH IN AEPB: 1.0000 | INHALATION_SPRAY | Freq: Once | RESPIRATORY_TRACT | 2 refills | Status: AC

## 2019-09-06 MED ORDER — SERTRALINE HCL 50 MG PO TABS: 50.0000 mg | ORAL_TABLET | Freq: Every day | ORAL | 1 refills | Status: DC

## 2019-09-06 NOTE — Progress Notes (Signed)
Patient: Christy Mason Female    DOB: Dec 14, 1960   59 y.o.   MRN: [id number] Visit Date: 09/06/2019  Today's Provider: [name], PA-C   Chief Complaint  Patient presents with  . COPD   Subjective:    I, Christy Mason,CMA am acting as a Neurosurgeon for [place].  HPI  COPD Patient presents today for COPD follow-up. Patient states for the last 2 weeks she has noticed she is more short of breath. Patient states that she walks a short distant and is out of breath. 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 Patient is currently not on any oxygen and states she has not seen a pulmonary doctor in years. Patient would like to be referred to a pulmonary doctor.   She is currently on Advair and Spiriva. She feels her breathing has worsened. She continues to smoke 1 pack per day. She has tried wellbutrin before for tobacco cessation which she reports made her feel crazy.   HTN: Currently taking metoprolol 25 mg BID which she was started on after hospitalization for vessel repair.   BP Readings from Last 8 Encounters:  09/06/19 (!) 148/100  02/26/19 121/83  02/14/19 104/65  02/09/19 123/74  02/02/19 106/79  01/29/19 108/60  01/23/19 (!) 89/64  01/10/19 131/89   Depression: She reports increase in depressive symptoms. She has a hard time doing activities due to SOB from COPD. She has previously been on several depression medications including zoloft, paxil and cymbalta. She reports she has done well on zoloft.   Reports frequent urination at night, snores, and awakens frequently. She feels tired during the day. She has not had a sleep study prior.   Allergies  Allergen Reactions  . Acetaminophen   . Nsaids Other (See Comments)    irritates stomach severley   . Other     Steroids-muscle weakness and headache  . Sular [Nisoldipine Er] Palpitations     Current Outpatient Medications:  .  ADVAIR  DISKUS 250-50 MCG/DOSE AEPB, Inhale 1 puff into the lungs daily. INL 1 PUFF PO BID, Disp: 28 each, Rfl: 2 .  Ascorbic Acid (VITAMIN C) 100 MG tablet, Take 100 mg by mouth daily. , Disp: , Rfl:  .  aspirin EC 81 MG tablet, Take 81 mg by mouth daily., Disp: , Rfl:  .  cetirizine (ZYRTEC) 10 MG tablet, Take 10 mg by mouth daily., Disp: , Rfl:  .  cholecalciferol (VITAMIN D3) 25 MCG (1000 UT) tablet, Take 1,000 Units by mouth daily., Disp: , Rfl:  .  clopidogrel (PLAVIX) 75 MG tablet, Take 1 tablet (75 mg total) by mouth daily., Disp: 30 tablet, Rfl: 11 .  metoprolol tartrate (LOPRESSOR) 25 MG tablet, Take 1 tablet (25 mg total) by mouth 2 (two) times daily., Disp: 60 tablet, Rfl: 6 .  Multiple Vitamin (MULTIVITAMIN WITH MINERALS) TABS tablet, Take 1 tablet by mouth daily., Disp: , Rfl:  .  oxyCODONE (OXY IR/ROXICODONE) 5 MG immediate release tablet, One to Two Tabs Every Six Hours As Needed For Pain, Disp: 50 tablet, Rfl: 0 .  PROAIR HFA 108 (90 Base) MCG/ACT inhaler, INHALE 2 PUFFS INTO THE LUNGS EVERY 6 HOURS AS NEEDED FOR WHEEZING OR SHORTNESS OF BREATH, Disp: 17 g, Rfl: 1 .  SPIRIVA HANDIHALER 18 MCG inhalation capsule, INHALE CONTENTS OF 1 CAPSULE ONCE DAILY USING HANDIHALER, Disp: 90 capsule, Rfl: 1 .  SUBOXONE 8-2 MG FILM, Take 0.5 Film by mouth See admin instructions. Take 0.5 film by mouth scheduled in the morning, may take other 0.5 film if needed for pain., Disp: , Rfl:  .  triamcinolone (NASACORT ALLERGY 24HR) 55 MCG/ACT AERO nasal inhaler, Place 1 spray into the nose daily., Disp: , Rfl:  .  vitamin E 100 UNIT capsule, Take 100 Units by mouth daily. , Disp: , Rfl:   Review of Systems  Respiratory: Positive for chest tightness, shortness of breath and wheezing.   Genitourinary: Negative.   Neurological: Negative.     Social History   Tobacco Use  . Smoking status: Current Every Day Smoker    Packs/day: 1.00    Years: 25.00    Pack years: 25.00    Types: Cigarettes  . Smokeless  tobacco: Never Used  Substance Use Topics  . Alcohol use: Not Currently      Objective:   BP (!) 148/100 (BP Location: Left Arm, Patient Position: Sitting, Cuff Size: Normal)   Pulse 86   Temp (!) 96.8 F (36 C) (Temporal)   Wt 191 lb 6.4 oz (86.8 kg)   SpO2 94%   BMI 35.01 kg/m  Vitals:   09/06/19 0947  BP: (!) 148/100  Pulse: 86  Temp: (!) 96.8 F (36 C)  TempSrc: Temporal  SpO2: 94%  Weight: 191 lb 6.4 oz (86.8 kg)  Body mass index is 35.01 kg/m.   Physical Exam   No results found for any visits on 09/06/19.     Assessment & Plan    1. Chronic obstructive pulmonary disease, unspecified COPD type ([place])  Will change her to trelegy, sample given as below. Will refer to pulmonology. Will try nicotrol for tobacco cessation. Discussed how this is the most important thing for her to do.   - Fluticasone-Umeclidin-Vilant (TRELEGY ELLIPTA) 100-62.5-25 MCG/INH AEPB; Inhale 1 puff into the lungs once for 1 dose.  Dispense: 30 each; Refill: 2 - Ambulatory referral to Pulmonology  2. Depression, unspecified depression type  Start as below.  - sertraline (ZOLOFT) 50 MG tablet; Take 1 tablet (50 mg total) by mouth daily.  Dispense: 90 tablet; Refill: 1  3. Tobacco abuse  - nicotine (NICOTROL) 10 MG inhaler; Inhale 1 Cartridge (1 continuous puffing total) into the lungs as needed for smoking cessation.  Dispense: 168 each; Refill: 0  4. Essential hypertension  Add medicine as below. Follow up in 1 month to check CMET and BP.   - losartan-hydrochlorothiazide (HYZAAR) 50-12.5 MG tablet; Take 1 tablet by mouth daily.  Dispense: 90 tablet; Refill: 1  5. Suspected sleep apnea  The entirety of the information documented in the History of Present Illness, Review of Systems and Physical Exam were personally obtained by me. Portions of this information were initially documented by [facility] and reviewed by me for thoroughness and accuracy.        Trinna Post,  PA-C  [facility]

## 2019-09-13 ENCOUNTER — Telehealth: Payer: Self-pay | Admitting: Physician Assistant

## 2019-09-13 NOTE — Telephone Encounter (Signed)
Patient is calling to check on the status of the PA for Trilogy and nicotine inhaler Please advise CB- [phone]

## 2019-09-17 NOTE — Telephone Encounter (Signed)
Pt called to follow up on status of PA for Trilogy and nicotine inahaler. Stated she only has 2 doses left of Trilogy sample Adriana gave her. She would like to know if she can get more samples. Please follow up with pt.

## 2019-09-19 NOTE — Telephone Encounter (Signed)
Tired calling patient to advised her the prior authorization request was send on 09/14/2019 and the office has not heard anything yet for the insurance company yet. Left voicemail message for patient to return call, if patient calls back ok for [facility] to return advise patient of message.

## 2019-09-19 NOTE — Telephone Encounter (Signed)
Attempted to notify pt of the status of he PA for medications. No answer and not able to leave a message.

## 2019-09-19 NOTE — Telephone Encounter (Signed)
Pt is aware of the below message. Pt would like another sample of trelegy

## 2019-09-20 NOTE — Telephone Encounter (Signed)
OK to give sample 

## 2019-09-20 NOTE — Telephone Encounter (Signed)
Pt called a third time regarding Trelegy samples. She ran out of medication yesterday. Please follow up with pt.

## 2019-09-20 NOTE — Telephone Encounter (Signed)
Patient advised that we are currently out of trelegy samples and that it is okay for her to use previous inhalers until we hear something back from PA.

## 2019-10-02 ENCOUNTER — Other Ambulatory Visit: Payer: Self-pay | Admitting: Physician Assistant

## 2019-10-02 DIAGNOSIS — J449 Chronic obstructive pulmonary disease, unspecified: Secondary | ICD-10-CM

## 2019-10-02 NOTE — Telephone Encounter (Signed)
Requested medication (s) are due for refill today: no  Requested medication (s) are on the active medication list: no  Last refill:  discontinued on 09/06/19  Future visit scheduled: yes  Notes to clinic:  See pharmacy request. Medication discontinued on 09/06/19    Requested Prescriptions  Pending Prescriptions Disp Refills   SPIRIVA HANDIHALER 18 MCG inhalation capsule [Pharmacy Med Name: SPIRIVA CAPS 30S &HANDIHALER] 90 capsule 1    Sig: INHALE CONTENTS OF 1 CAPSULE ONCE DAILY USING HANDIHALER      Pulmonology:  Anticholinergic Agents Passed - 10/02/2019  5:23 PM      Passed - Valid encounter within last 12 months    Recent Outpatient Visits           3 weeks ago Chronic obstructive pulmonary disease, unspecified COPD type [facility])   [facility] [place], Lavella Hammock, PA-C   8 months ago Annual physical exam   [facility] Osvaldo Angst M, [place]   9 months ago Chronic obstructive pulmonary disease, unspecified COPD type [facility])   [facility] [name], PA-C       Future Appointments             In 6 days [name], PA-C [facility], [facility]

## 2019-10-08 ENCOUNTER — Encounter: Payer: Self-pay | Admitting: Physician Assistant

## 2019-10-08 ENCOUNTER — Other Ambulatory Visit: Payer: Self-pay

## 2019-10-08 ENCOUNTER — Other Ambulatory Visit: Payer: Self-pay | Admitting: Physician Assistant

## 2019-10-08 ENCOUNTER — Ambulatory Visit: Payer: Medicaid Other | Admitting: Physician Assistant

## 2019-10-08 VITALS — BP 138/88 | HR 77 | Temp 94.7°F | Wt 190.0 lb

## 2019-10-08 DIAGNOSIS — I10 Essential (primary) hypertension: Secondary | ICD-10-CM | POA: Diagnosis not present

## 2019-10-08 DIAGNOSIS — Z1211 Encounter for screening for malignant neoplasm of colon: Secondary | ICD-10-CM

## 2019-10-08 DIAGNOSIS — J449 Chronic obstructive pulmonary disease, unspecified: Secondary | ICD-10-CM

## 2019-10-08 DIAGNOSIS — R29818 Other symptoms and signs involving the nervous system: Secondary | ICD-10-CM | POA: Diagnosis not present

## 2019-10-08 DIAGNOSIS — E785 Hyperlipidemia, unspecified: Secondary | ICD-10-CM | POA: Diagnosis not present

## 2019-10-08 MED ORDER — NICOTINE 10 MG IN INHA: 1.0000 | RESPIRATORY_TRACT | 0 refills | Status: DC | PRN

## 2019-10-08 MED ORDER — TRELEGY ELLIPTA 200-62.5-25 MCG/INH IN AEPB: 1.0000 | INHALATION_SPRAY | Freq: Every day | RESPIRATORY_TRACT | 1 refills | Status: DC

## 2019-10-08 NOTE — Patient Instructions (Signed)
Chronic Obstructive Pulmonary Disease Chronic obstructive pulmonary disease (COPD) is a long-term (chronic) lung problem. When you have COPD, it is hard for air to get in and out of your lungs. Usually the condition gets worse over time, and your lungs will never return to normal. There are things you can do to keep yourself as healthy as possible.  Your doctor may treat your condition with: ? Medicines. ? Oxygen. ? Lung surgery.  Your doctor may also recommend: ? Rehabilitation. This includes steps to make your body work better. It may involve a team of specialists. ? Quitting smoking, if you smoke. ? Exercise and changes to your diet. ? Comfort measures (palliative care). Follow these instructions at home: Medicines  Take over-the-counter and prescription medicines only as told by your doctor.  Talk to your doctor before taking any cough or allergy medicines. You may need to avoid medicines that cause your lungs to be dry. Lifestyle  If you smoke, stop. Smoking makes the problem worse. If you need help quitting, ask your doctor.  Avoid being around things that make your breathing worse. This may include smoke, chemicals, and fumes.  Stay active, but remember to rest as well.  Learn and use tips on how to relax.  Make sure you get enough sleep. Most adults need at least 7 hours of sleep every night.  Eat healthy foods. Eat smaller meals more often. Rest before meals. Controlled breathing Learn and use tips on how to control your breathing as told by your doctor. Try:  Breathing in (inhaling) through your nose for 1 second. Then, pucker your lips and breath out (exhale) through your lips for 2 seconds.  Putting one hand on your belly (abdomen). Breathe in slowly through your nose for 1 second. Your hand on your belly should move out. Pucker your lips and breathe out slowly through your lips. Your hand on your belly should move in as you breathe out.  Controlled coughing Learn  and use controlled coughing to clear mucus from your lungs. Follow these steps: 1. Lean your head a little forward. 2. Breathe in deeply. 3. Try to hold your breath for 3 seconds. 4. Keep your mouth slightly open while coughing 2 times. 5. Spit any mucus out into a tissue. 6. Rest and do the steps again 1 or 2 times as needed. General instructions  Make sure you get all the shots (vaccines) that your doctor recommends. Ask your doctor about a flu shot and a pneumonia shot.  Use oxygen therapy and pulmonary rehabilitation if told by your doctor. If you need home oxygen therapy, ask your doctor if you should buy a tool to measure your oxygen level (oximeter).  Make a COPD action plan with your doctor. This helps you to know what to do if you feel worse than usual.  Manage any other conditions you have as told by your doctor.  Avoid going outside when it is very hot, cold, or humid.  Avoid people who have a sickness you can catch (contagious).  Keep all follow-up visits as told by your doctor. This is important. Contact a doctor if:  You cough up more mucus than usual.  There is a change in the color or thickness of the mucus.  It is harder to breathe than usual.  Your breathing is faster than usual.  You have trouble sleeping.  You need to use your medicines more often than usual.  You have trouble doing your normal activities such as getting dressed   or walking around the house. Get help right away if:  You have shortness of breath while resting.  You have shortness of breath that stops you from: ? Being able to talk. ? Doing normal activities.  Your chest hurts for longer than 5 minutes.  Your skin color is more blue than usual.  Your pulse oximeter shows that you have low oxygen for longer than 5 minutes.  You have a fever.  You feel too tired to breathe normally. Summary  Chronic obstructive pulmonary disease (COPD) is a long-term lung problem.  The way your  lungs work will never return to normal. Usually the condition gets worse over time. There are things you can do to keep yourself as healthy as possible.  Take over-the-counter and prescription medicines only as told by your doctor.  If you smoke, stop. Smoking makes the problem worse. This information is not intended to replace advice given to you by your health care provider. Make sure you discuss any questions you have with your health care provider. Document Revised: 06/10/2017 Document Reviewed: 08/02/2016 Elsevier Patient Education  2020 Elsevier Inc.  

## 2019-10-08 NOTE — Progress Notes (Signed)
Patient: Christy Mason Female    DOB: 1961-04-12   59 y.o.   MRN: [id number] Visit Date: 10/08/2019  Today's Provider: Trinna Post, PA-C   Chief Complaint  Patient presents with  . Hypertension   Subjective:     HPI  Hypertension, follow-up:  BP Readings from Last 3 Encounters:  10/08/19 138/88  09/06/19 (!) 148/100  02/26/19 121/83    She was last seen for hypertension 1 months ago.  BP at that visit was 148/100. Management changes since that visit include started on Losartan-HCTZ 50-12.5 mg daily. She reports good compliance with treatment. She is not having side effects.  She is exercising. She is adherent to low salt diet.   Outside blood pressures are around 120's/70's. She is experiencing none.  Patient denies chest pain, chest pressure/discomfort, fatigue, irregular heart beat, lower extremity edema and palpitations.   Cardiovascular risk factors include hypertension.  Use of agents associated with hypertension: none.     Weight trend: increasing steadily Wt Readings from Last 3 Encounters:  10/08/19 190 lb (86.2 kg)  09/06/19 191 lb 6.4 oz (86.8 kg)  02/26/19 182 lb 12.8 oz (82.9 kg)    Current diet: well balanced, low salt  COPD: She is currently on spiriva and advair, had samples of trelegy which worked well for her but insurance denied.   Tobacco abuse: Has tried and failed patches wellbutrin and chantix previously.   Was referred to [facility] for sleep study, which is required to be in lab by insurance, but this is very far for her.   ------------------------------------------------------------------------    Allergies  Allergen Reactions  . Acetaminophen   . Nsaids Other (See Comments)    irritates stomach severley   . Other     Steroids-muscle weakness and headache  . Sular [Nisoldipine Er] Palpitations     Current Outpatient Medications:  .  Ascorbic Acid (VITAMIN C) 100 MG tablet, Take 100 mg by mouth daily. , Disp: ,  Rfl:  .  aspirin EC 81 MG tablet, Take 81 mg by mouth daily., Disp: , Rfl:  .  cetirizine (ZYRTEC) 10 MG tablet, Take 10 mg by mouth daily., Disp: , Rfl:  .  cholecalciferol (VITAMIN D3) 25 MCG (1000 UT) tablet, Take 1,000 Units by mouth daily., Disp: , Rfl:  .  clopidogrel (PLAVIX) 75 MG tablet, Take 1 tablet (75 mg total) by mouth daily., Disp: 30 tablet, Rfl: 11 .  losartan-hydrochlorothiazide (HYZAAR) 50-12.5 MG tablet, Take 1 tablet by mouth daily., Disp: 90 tablet, Rfl: 1 .  metoprolol tartrate (LOPRESSOR) 25 MG tablet, Take 1 tablet (25 mg total) by mouth 2 (two) times daily., Disp: 60 tablet, Rfl: 6 .  Multiple Vitamin (MULTIVITAMIN WITH MINERALS) TABS tablet, Take 1 tablet by mouth daily., Disp: , Rfl:  .  nicotine (NICOTROL) 10 MG inhaler, Inhale 1 Cartridge (1 continuous puffing total) into the lungs as needed for smoking cessation., Disp: 168 each, Rfl: 0 .  oxyCODONE (OXY IR/ROXICODONE) 5 MG immediate release tablet, One to Two Tabs Every Six Hours As Needed For Pain, Disp: 50 tablet, Rfl: 0 .  PROAIR HFA 108 (90 Base) MCG/ACT inhaler, INHALE 2 PUFFS INTO THE LUNGS EVERY 6 HOURS AS NEEDED FOR WHEEZING OR SHORTNESS OF BREATH, Disp: 17 g, Rfl: 1 .  sertraline (ZOLOFT) 50 MG tablet, Take 1 tablet (50 mg total) by mouth daily., Disp: 90 tablet, Rfl: 1 .  SPIRIVA HANDIHALER 18 MCG inhalation capsule, INHALE CONTENTS OF 1  CAPSULE ONCE DAILY USING HANDIHALER, Disp: 90 capsule, Rfl: 1 .  SUBOXONE 8-2 MG FILM, Take 0.5 Film by mouth See admin instructions. Take 0.5 film by mouth scheduled in the morning, may take other 0.5 film if needed for pain., Disp: , Rfl:  .  triamcinolone (NASACORT ALLERGY 24HR) 55 MCG/ACT AERO nasal inhaler, Place 1 spray into the nose daily., Disp: , Rfl:  .  vitamin E 100 UNIT capsule, Take 100 Units by mouth daily. , Disp: , Rfl:   Review of Systems  Constitutional: Negative.   Respiratory: Negative.   Cardiovascular: Negative.   Hematological: Negative.      Social History   Tobacco Use  . Smoking status: Current Every Day Smoker    Packs/day: 1.00    Years: 25.00    Pack years: 25.00    Types: Cigarettes  . Smokeless tobacco: Never Used  Substance Use Topics  . Alcohol use: Not Currently      Objective:   BP 138/88 (BP Location: Left Arm, Patient Position: Sitting, Cuff Size: Normal)   Pulse 77   Temp (!) 94.7 F (34.8 C) (Temporal)   Wt 190 lb (86.2 kg)   SpO2 95%   BMI 34.75 kg/m  Vitals:   10/08/19 1452  BP: 138/88  Pulse: 77  Temp: (!) 94.7 F (34.8 C)  TempSrc: Temporal  SpO2: 95%  Weight: 190 lb (86.2 kg)  Body mass index is 34.75 kg/m.   Physical Exam Constitutional:      Appearance: Normal appearance.  Cardiovascular:     Rate and Rhythm: Normal rate and regular rhythm.     Heart sounds: Normal heart sounds.  Pulmonary:     Effort: Pulmonary effort is normal.     Breath sounds: Normal breath sounds.  Skin:    General: Skin is warm and dry.  Neurological:     Mental Status: She is alert and oriented to person, place, and time. Mental status is at baseline.  Psychiatric:        Mood and Affect: Mood normal.        Behavior: Behavior normal.      No results found for any visits on 10/08/19.     Assessment & Plan    1. Chronic obstructive pulmonary disease, unspecified COPD type (HCC)  Will prescribe again. We will need to resubmit information as she has tried and been inadequately controlled on 2+ COPD medications. I have scheduled her for a COVID vaccination on 10/15/2019 in [place] with [facility]. She has been provided the information. If she would like to cancel it, she can call the number on the print out.   - Fluticasone-Umeclidin-Vilant (TRELEGY ELLIPTA) 200-62.5-25 MCG/INH AEPB; Inhale 1 puff into the lungs daily.  Dispense: 60 each; Refill: 1 - nicotine (NICOTROL) 10 MG inhaler; Inhale 1 Cartridge (1 continuous puffing total) into the lungs as needed for smoking cessation.  Dispense:  42 each; Refill: 0  2. Essential hypertension  Better controlled, follow up in 6 months.  - Comprehensive Metabolic Panel (CMET)  3. Suspected sleep apnea  Refer to closer sleep center.  - Ambulatory referral to Sleep Studies  4. Colon cancer screening  Resend cologuard, patient reports she did not get this.  - Cologuard  5. Hyperlipidemia, unspecified hyperlipidemia type  - Lipid Profile      [name], PA-C  [facility]

## 2019-10-09 LAB — COMPREHENSIVE METABOLIC PANEL
ALT: 10 IU/L (ref 0–32)
AST: 16 IU/L (ref 0–40)
Albumin/Globulin Ratio: 1.1 — ABNORMAL LOW (ref 1.2–2.2)
Albumin: 3.6 g/dL — ABNORMAL LOW (ref 3.8–4.9)
Alkaline Phosphatase: 172 IU/L — ABNORMAL HIGH (ref 39–117)
BUN/Creatinine Ratio: 9 — ABNORMAL LOW (ref 12–28)
BUN: 7 mg/dL — ABNORMAL LOW (ref 8–27)
Bilirubin Total: <0.2 mg/dL (ref 0.0–1.2)
CO2: 26 mmol/L (ref 20–29)
Calcium: 9.4 mg/dL (ref 8.7–10.3)
Chloride: 101 mmol/L (ref 96–106)
Creatinine, Ser: 0.79 mg/dL (ref 0.57–1.00)
GFR calc Af Amer: 94 mL/min/{1.73_m2} (ref >59)
GFR calc non Af Amer: 82 mL/min/{1.73_m2} (ref >59)
Globulin, Total: 3.2 g/dL (ref 1.5–4.5)
Glucose: 84 mg/dL (ref 65–99)
Potassium: 4.3 mmol/L (ref 3.5–5.2)
Sodium: 142 mmol/L (ref 134–144)
Total Protein: 6.8 g/dL (ref 6.0–8.5)

## 2019-10-09 LAB — LIPID PANEL
Chol/HDL Ratio: 4.5 ratio — ABNORMAL HIGH (ref 0.0–4.4)
Cholesterol, Total: 167 mg/dL (ref 100–199)
HDL: 37 mg/dL — ABNORMAL LOW (ref >39)
LDL Chol Calc (NIH): 101 mg/dL — ABNORMAL HIGH (ref 0–99)
Triglycerides: 166 mg/dL — ABNORMAL HIGH (ref 0–149)
VLDL Cholesterol Cal: 29 mg/dL (ref 5–40)

## 2019-10-10 ENCOUNTER — Telehealth: Payer: Self-pay | Admitting: Physician Assistant

## 2019-10-10 NOTE — Telephone Encounter (Signed)
[facility] requesting refills on Trelegy 200-62.5 mcg inhaler

## 2019-10-10 NOTE — Telephone Encounter (Signed)
Refill was send into pharmacy on 10/08/2019.

## 2019-10-10 NOTE — Telephone Encounter (Signed)
Inhaler was refilled on 10/08/2019.

## 2019-10-10 NOTE — Telephone Encounter (Signed)
[name]. Asking for refills on Nicotrol Oral 10 mg/crtdg

## 2019-10-12 ENCOUNTER — Telehealth: Payer: Self-pay

## 2019-10-12 DIAGNOSIS — I10 Essential (primary) hypertension: Secondary | ICD-10-CM

## 2019-10-12 DIAGNOSIS — R748 Abnormal levels of other serum enzymes: Secondary | ICD-10-CM

## 2019-10-12 DIAGNOSIS — E785 Hyperlipidemia, unspecified: Secondary | ICD-10-CM

## 2019-10-12 NOTE — Telephone Encounter (Signed)
Called patient no answer left voicemail message to cal the office back. If patient calls back ok for [facility] to advise patient of message.

## 2019-10-12 NOTE — Telephone Encounter (Signed)
-----   Message from [name], [place] sent at 10/12/2019 11:56 AM EDT ----- Cholesterol is elevated and she has an increased risk of cardiovascular disease like heart attack and stroke. She should be on a cholesterol medication. However, some liver labs on her metabolic panel are abnormal. Sometimes this can be due to chronic liver disease or inflammation of the liver. She can either follow up about this sooner or we may address it at her follow up. I'd like to look at her liver more before starting a cholesterol medication.

## 2019-10-15 ENCOUNTER — Ambulatory Visit: Payer: Medicaid Other

## 2019-10-15 NOTE — Telephone Encounter (Signed)
Attempted to contact pt; left message on voicemail. 

## 2019-10-16 NOTE — Telephone Encounter (Signed)
Patient was advised and states that she will come in to have labs redrawn this week. Patient also wanted to have cholesterol rechecked due to not fasting the day of having blood work done.Labs reordered.FYI

## 2019-10-16 NOTE — Addendum Note (Signed)
Addended by: Cheron Every C on: 10/16/2019 05:09 PM   Modules accepted: Orders

## 2019-10-16 NOTE — Telephone Encounter (Signed)
Pt. Called back, given results. Verbalizes understanding. Wants to know if it is ok to wait until her September appointment or should she be seen sooner. Please advise pt.

## 2019-10-17 NOTE — Addendum Note (Signed)
Addended by: [name] on: 10/17/2019 02:46 PM   Modules accepted: Orders

## 2019-10-17 NOTE — Telephone Encounter (Signed)
I've sent in additional labs for her to get when she comes for draw.

## 2019-10-30 ENCOUNTER — Other Ambulatory Visit: Payer: Self-pay

## 2019-10-30 ENCOUNTER — Ambulatory Visit: Payer: Medicaid Other | Admitting: Internal Medicine

## 2019-10-30 ENCOUNTER — Encounter: Payer: Self-pay | Admitting: Internal Medicine

## 2019-10-30 VITALS — BP 126/80 | HR 122 | Temp 97.1°F | Ht 62.0 in | Wt 187.4 lb

## 2019-10-30 DIAGNOSIS — J449 Chronic obstructive pulmonary disease, unspecified: Secondary | ICD-10-CM

## 2019-10-30 MED ORDER — PREDNISONE 20 MG PO TABS: 20.0000 mg | ORAL_TABLET | Freq: Every day | ORAL | 1 refills | Status: DC

## 2019-10-30 MED ORDER — ALBUTEROL SULFATE (2.5 MG/3ML) 0.083% IN NEBU: 2.5000 mg | INHALATION_SOLUTION | RESPIRATORY_TRACT | 12 refills | Status: DC | PRN

## 2019-10-30 NOTE — Progress Notes (Signed)
Name: Christy Mason MRN: [id number] DOB: 08/28/1960     CONSULTATION DATE:  REFERRING-[facility]   CHIEF COMPLAINT: SOB  STUDIES:     05/2019 CT chest Independently reviewed by Me  No pneumothorax or pleural effusion is noted. Emphysematous disease is noted in both lungs.   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 Patient currently is on Advair and Spiriva She has been on this regimen for 4 years but seems to not help anymore  Patient is actively wheezing at this time and has increased work of breathing with diaphoresis However she is not in extremis  I have explained to her she will need nebulizers as well as continuation of her inhalers I have explained to her we also need to assess for exertional and nocturnal hypoxia  Patient states that she is intolerant of prednisone therapy but I recommended a middle-of-the-road dosage to help with her symptoms She has agreed to take a low-dose prednisone   Smoking Assessment and Cessation Counseling Upon further questioning, Patient smokes 1 ppd  I have advised patient to quit/stop smoking as soon as possible due to high risk for multiple medical problems  Patient is willing to quit smoking   I have advised patient that we can assist and have options of Nicotine replacement therapy. I also advised patient on behavioral therapy and can provide oral medication therapy in conjunction with the other therapies  Follow up next Office visit  for assessment of smoking cessation  Smoking cessation counseling advised for 4 minutes     PAST  MEDICAL HISTORY :   has a past medical history of Allergy, Anxiety, Arthritis, Asthma, Complication of anesthesia, COPD (chronic obstructive pulmonary disease) ([place]), Emphysema of lung ([place]), GERD (gastroesophageal reflux disease), History of hiatal hernia, Hypertension, Hyperthyroidism, Neuromuscular disorder ([place]), PONV (postoperative nausea and vomiting), and Thyroid disease.  has a past surgical history that includes Tubal ligation; Functional endoscopic sinus surgery; Fracture surgery (Right); Cholecystectomy; Carpal tunnel release (Right); Upper Extremity Angiography (Right, 01/23/2019); Salpingoophorectomy (Right); and Upper Extremity Angiography (Right, 02/07/2019). Prior to Admission medications   Medication Sig Start Date End Date Taking? Authorizing Provider  Ascorbic Acid (VITAMIN C) 100 MG tablet Take 100 mg by mouth daily.     [provider]  aspirin EC 81 MG tablet Take 81 mg by mouth daily.    [provider]  cetirizine (ZYRTEC) 10 MG tablet Take 10 mg by mouth daily.    [provider]  cholecalciferol (VITAMIN D3) 25 MCG (1000 UT) tablet Take 1,000 Units by mouth daily.    [provider]  clopidogrel (PLAVIX) 75 MG tablet Take 1 tablet (75 mg total) by mouth daily. 02/08/19   Schnier, Dolores Lory, MD  Fluticasone-Umeclidin-Vilant (TRELEGY ELLIPTA) 200-62.5-25 MCG/INH AEPB Inhale 1 puff into the lungs daily. 10/08/19   Trinna Post, PA-C  losartan-hydrochlorothiazide (HYZAAR) 50-12.5 MG tablet Take 1 tablet by mouth daily. 09/06/19   Trinna Post, PA-C  metoprolol tartrate (LOPRESSOR) 25 MG tablet Take 1 tablet (25 mg total) by mouth 2 (two) times daily. 02/14/19   Stegmayer, Janalyn Harder, PA-C  Multiple Vitamin (MULTIVITAMIN WITH MINERALS) TABS  tablet Take 1 tablet by mouth daily.    [provider]  nicotine (NICOTROL) 10 MG inhaler Inhale 1 Cartridge (1 continuous puffing total) into the lungs as needed for smoking cessation. 10/08/19    [name], PA-C  oxyCODONE (OXY IR/ROXICODONE) 5 MG immediate release tablet One to Two Tabs Every Six Hours As Needed For Pain 02/14/19   Tonette Lederer, PA-C  PROAIR HFA 108 (90 Base) MCG/ACT inhaler INHALE 2 PUFFS INTO THE LUNGS EVERY 6 HOURS AS NEEDED FOR WHEEZING OR SHORTNESS OF BREATH 07/20/19   [name], PA-C  sertraline (ZOLOFT) 50 MG tablet Take 1 tablet (50 mg total) by mouth daily. 09/06/19   [name], PA-C  SPIRIVA HANDIHALER 18 MCG inhalation capsule INHALE CONTENTS OF 1 CAPSULE ONCE DAILY USING HANDIHALER 10/03/19   Osvaldo Angst M, PA-C  SUBOXONE 8-2 MG FILM Take 0.5 Film by mouth See admin instructions. Take 0.5 film by mouth scheduled in the morning, may take other 0.5 film if needed for pain. 12/21/18   [provider]  triamcinolone (NASACORT ALLERGY 24HR) 55 MCG/ACT AERO nasal inhaler Place 1 spray into the nose daily.    [provider]  vitamin E 100 UNIT capsule Take 100 Units by mouth daily.     [provider]   Allergies  Allergen Reactions  . Acetaminophen   . Nsaids Other (See Comments)    irritates stomach severley   . Other     Steroids-muscle weakness and headache  . Sular [Nisoldipine Er] Palpitations    FAMILY HISTORY:  family history includes COPD in her mother; Depression in her mother; Diabetes in her father; Hypertension in her father and mother. SOCIAL HISTORY:  reports that she has been smoking cigarettes. She has a 25.00 pack-year smoking history. She has never used smokeless tobacco. She reports previous alcohol use. She reports previous drug use.    Review of Systems:  Gen:  Denies  fever, sweats, chills weigh loss  HEENT: Denies blurred vision, double vision, ear pain, eye pain, hearing loss, nose bleeds, sore throat Cardiac:  No dizziness, chest pain or heaviness, chest tightness,edema, No JVD Resp:+ cough, -sputum production, +shortness of breath,+wheezing, -hemoptysis,  Gi: Denies  swallowing difficulty, stomach pain, nausea or vomiting, diarrhea, constipation, bowel incontinence Gu:  Denies bladder incontinence, burning urine Ext:   Denies Joint pain, stiffness or swelling Skin: Denies  skin rash, easy bruising or bleeding or hives Endoc:  Denies polyuria, polydipsia , polyphagia or weight change Psych:   Denies depression, insomnia or hallucinations  Other:  All other systems negative    BP 126/80 (BP Location: Left Arm, Cuff Size: Large)   Pulse (!) 122   Temp (!) 97.1 F (36.2 C) (Temporal)   Ht 5\' 2"  (1.575 m)   Wt 187 lb 6.4 oz (85 kg)   SpO2 95%   BMI 34.28 kg/m       Physical Examination:   General Appearance: No distress  Neuro:without focal findings,  speech normal,  HEENT: PERRLA, EOM intact.   Pulmonary: normal breath sounds, + wheezing.  CardiovascularNormal S1,S2.  No m/r/g.   Abdomen: Benign, Soft, non-tender. Renal:  No costovertebral tenderness  GU:  Not performed at this time. Endoc: No evident thyromegaly Skin:   warm, no rashes, no ecchymosis  Extremities: normal, no cyanosis, clubbing. PSYCHIATRIC: Mood, affect within normal limits.   ALL OTHER ROS ARE NEGATIVE    MEDICATIONS: I have reviewed all medications and confirmed regimen as documented  ASSESSMENT AND PLAN SYNOPSIS  59 year old pleasant white female with increasing shortness of breath and dyspnea exertion most likely related to bilateral emphysematous changes which is related to COPD  COPD exacerbation Gold stage D Prednisone 20 mg for 7 days Continue Advair as prescribed Continue Spiriva HandiHaler as prescribed Patient will need pulmonary function testing   Exertional shortness of breath and dyspnea on exertion Patient needs assessment for exertional and nocturnal hypoxia Check 6-minute walk test Check overnight pulse oximetry  Patient does have assessment for sleep apnea pending from her PCP Await sleep study results  Smoking cessation  strongly advised  COVID-19 EDUCATION: The signs and symptoms of COVID-19 were discussed with the patient and how to seek care for testing.  The importance of social distancing was discussed today. Hand Washing Techniques and avoid touching face was advised.     MEDICATION ADJUSTMENTS/LABS AND TESTS ORDERED: Continue inhalers as prescribed Patient needs 6-minute walk test to assess for oxygen Obtain overnight pulse oximetry  Check pulmonary function testing   CURRENT MEDICATIONS REVIEWED AT LENGTH WITH PATIENT TODAY   Patient satisfied with Plan of action and management. All questions answered  Follow up in 6 months Total time spent 37 minutes   Lucie Leather, M.D.  [facility]  Medical Director [facility] Medical Director [facility] Cardio-Pulmonary Department

## 2019-10-30 NOTE — Patient Instructions (Signed)
Please stop smoking!!!!  Continue inhalers as prescribed Patient needs 6-minute walk test to assess for oxygen Obtain overnight pulse oximetry  Check pulmonary function testing

## 2019-10-30 NOTE — Addendum Note (Signed)
Addended by: Pilar Grammes on: 10/30/2019 04:21 PM   Modules accepted: Orders

## 2019-11-05 ENCOUNTER — Other Ambulatory Visit: Payer: Self-pay | Admitting: Physician Assistant

## 2019-11-05 ENCOUNTER — Other Ambulatory Visit: Payer: Self-pay

## 2019-11-05 ENCOUNTER — Ambulatory Visit (INDEPENDENT_AMBULATORY_CARE_PROVIDER_SITE_OTHER): Payer: Medicaid Other | Admitting: Pulmonary Disease

## 2019-11-05 DIAGNOSIS — J449 Chronic obstructive pulmonary disease, unspecified: Secondary | ICD-10-CM | POA: Diagnosis not present

## 2019-11-05 NOTE — Telephone Encounter (Signed)
Requested Prescriptions  Pending Prescriptions Disp Refills  . PROAIR HFA 108 (90 Base) MCG/ACT inhaler [Pharmacy Med Name: PROAIR HFA ORAL INH (200  PFS) 8.5G] 17 g 1    Sig: INHALE 2 PUFFS INTO THE LUNGS EVERY 6 HOURS AS NEEDED FOR WHEEZING OR SHORTNESS OF BREATH     Pulmonology:  Beta Agonists Failed - 11/05/2019 10:08 AM      Failed - One inhaler should last at least one month. If the patient is requesting refills earlier, contact the patient to check for uncontrolled symptoms.      Passed - Valid encounter within last 12 months    Recent Outpatient Visits          4 weeks ago Chronic obstructive pulmonary disease, unspecified COPD type [facility])   [facility] [place], Ricki Rodriguez M, PA-C   2 months ago Chronic obstructive pulmonary disease, unspecified COPD type [facility])   [facility] Osvaldo Angst M, PA-C   9 months ago Annual physical exam   [facility] Osvaldo Angst M, PA-C   10 months ago Chronic obstructive pulmonary disease, unspecified COPD type [facility])   [facility] [name], PA-C      Future Appointments            In 5 months [name], PA-C [facility]           . ADVAIR DISKUS 250-50 MCG/DOSE AEPB [Pharmacy Med Name: ADVAIR DISKUS 250/50MCG (YELLOW) 60] 60 each     Sig: INHALE 1 PUFF INTO THE LUNGS TWICE DAILY     Pulmonology:  Comb[employer] Passed - 11/05/2019 10:08 AM      Passed - Valid encounter within last 12 months    Recent Outpatient Visits          4 weeks ago Chronic obstructive pulmonary disease, unspecified COPD type [facility])   [facility], Adriana M, PA-C   2 months ago Chronic obstructive pulmonary disease, unspecified COPD type [facility])   [facility] Osvaldo Angst M, PA-C   9 months ago Annual physical exam   [facility] Osvaldo Angst M, PA-C   10 months ago Chronic obstructive pulmonary disease,  unspecified COPD type [facility])   [facility] [name], [place]      Future Appointments            In 5 months Jodi Marble, Lavella Hammock, PA-C [facility]

## 2019-11-05 NOTE — Telephone Encounter (Signed)
Medication was send into pharmacy by [facility]

## 2019-11-05 NOTE — Telephone Encounter (Signed)
Requested medication (s) are due for refill today: yes  Requested medication (s) are on the active medication list: no  Last refill:  10/30/19  Future visit scheduled: yes  Notes to clinic:  this med was prescribed by pulmonology referral   Requested Prescriptions  Pending Prescriptions Disp Refills   ADVAIR DISKUS 250-50 MCG/DOSE AEPB [Pharmacy Med Name: ADVAIR DISKUS 250/50MCG (YELLOW) 60] 60 each     Sig: INHALE 1 PUFF INTO THE LUNGS TWICE DAILY      Pulmonology:  Combination Products Passed - 11/05/2019 10:08 AM      Passed - Valid encounter within last 12 months    Recent Outpatient Visits           4 weeks ago Chronic obstructive pulmonary disease, unspecified COPD type [facility])   [facility] [place], Adriana M, PA-C   2 months ago Chronic obstructive pulmonary disease, unspecified COPD type [facility])   [facility] Osvaldo Angst M, PA-C   9 months ago Annual physical exam   [facility] Osvaldo Angst M, PA-C   10 months ago Chronic obstructive pulmonary disease, unspecified COPD type [facility])   [facility] [name], PA-C       Future Appointments             In 5 months [name], PA-C [facility]             Signed Prescriptions Disp Refills   PROAIR HFA 108 (90 Base) MCG/ACT inhaler 17 g 1    Sig: INHALE 2 PUFFS INTO THE LUNGS EVERY 6 HOURS AS NEEDED FOR WHEEZING OR SHORTNESS OF BREATH      Pulmonology:  Beta Agonists Failed - 11/05/2019 10:08 AM      Failed - One inhaler should last at least one month. If the patient is requesting refills earlier, contact the patient to check for uncontrolled symptoms.      Passed - Valid encounter within last 12 months    Recent Outpatient Visits           4 weeks ago Chronic obstructive pulmonary disease, unspecified COPD type [facility])   [facility] [place], Ricki Rodriguez M, PA-C   2 months ago Chronic obstructive pulmonary  disease, unspecified COPD type [facility])   [facility] Osvaldo Angst M, PA-C   9 months ago Annual physical exam   [facility] Osvaldo Angst M, PA-C   10 months ago Chronic obstructive pulmonary disease, unspecified COPD type [facility])   [facility] [name], PA-C       Future Appointments             In 5 months [name], PA-C [facility]

## 2019-11-15 ENCOUNTER — Other Ambulatory Visit: Payer: Self-pay

## 2019-11-15 ENCOUNTER — Other Ambulatory Visit
Admission: RE | Admit: 2019-11-15 | Discharge: 2019-11-15 | Disposition: A | Discharge status: Home / Self Care | Payer: Medicaid Other | Source: Ambulatory Visit | Attending: Internal Medicine | Admitting: Internal Medicine

## 2019-11-15 DIAGNOSIS — Z01812 Encounter for preprocedural laboratory examination: Secondary | ICD-10-CM | POA: Insufficient documentation

## 2019-11-15 DIAGNOSIS — Z20822 Contact with and (suspected) exposure to covid-19: Secondary | ICD-10-CM | POA: Diagnosis not present

## 2019-11-15 LAB — SARS CORONAVIRUS 2 (TAT 6-24 HRS): SARS Coronavirus 2: NEGATIVE

## 2019-11-19 ENCOUNTER — Other Ambulatory Visit: Admission: RE | Admit: 2019-11-19 | Payer: Medicaid Other | Source: Ambulatory Visit

## 2019-11-20 ENCOUNTER — Other Ambulatory Visit: Payer: Self-pay

## 2019-11-20 ENCOUNTER — Ambulatory Visit (INDEPENDENT_AMBULATORY_CARE_PROVIDER_SITE_OTHER): Payer: Medicaid Other | Admitting: Internal Medicine

## 2019-11-20 DIAGNOSIS — J449 Chronic obstructive pulmonary disease, unspecified: Secondary | ICD-10-CM

## 2019-11-20 LAB — PULMONARY FUNCTION TEST
DL/VA % pred: 75 %
DL/VA: 3.23 ml/min/mmHg/L
DLCO cor % pred: 65 %
DLCO cor: 12.63 ml/min/mmHg
DLCO unc % pred: 65 %
DLCO unc: 12.63 ml/min/mmHg
FEF 25-75 Post: 0.5 L/sec
FEF 25-75 Pre: 0.42 L/sec
FEF2575-%Change-Post: 19 %
FEF2575-%Pred-Post: 21 %
FEF2575-%Pred-Pre: 18 %
FEV1-%Change-Post: 9 %
FEV1-%Pred-Post: 45 %
FEV1-%Pred-Pre: 41 %
FEV1-Post: 1.09 L
FEV1-Pre: 1 L
FEV1FVC-%Change-Post: 2 %
FEV1FVC-%Pred-Pre: 62 %
FEV6-%Change-Post: 6 %
FEV6-%Pred-Post: 69 %
FEV6-%Pred-Pre: 64 %
FEV6-Post: 2.08 L
FEV6-Pre: 1.96 L
FEV6FVC-%Change-Post: 0 %
FEV6FVC-%Pred-Post: 99 %
FEV6FVC-%Pred-Pre: 99 %
FVC-%Change-Post: 6 %
FVC-%Pred-Post: 69 %
FVC-%Pred-Pre: 65 %
FVC-Post: 2.17 L
FVC-Pre: 2.04 L
Post FEV1/FVC ratio: 50 %
Post FEV6/FVC ratio: 96 %
Pre FEV1/FVC ratio: 49 %
Pre FEV6/FVC Ratio: 96 %
RV % pred: 140 %
RV: 2.68 L
TLC % pred: 102 %
TLC: 4.94 L

## 2019-11-20 NOTE — Progress Notes (Signed)
PFT done today. 

## 2019-12-27 ENCOUNTER — Telehealth (INDEPENDENT_AMBULATORY_CARE_PROVIDER_SITE_OTHER): Payer: Self-pay | Admitting: Vascular Surgery
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
## 2019-12-27 NOTE — Telephone Encounter (Signed)
Have her come in with a Right upper extremity arterial duplex.Marland Kitchen

## 2019-12-28 ENCOUNTER — Encounter (INDEPENDENT_AMBULATORY_CARE_PROVIDER_SITE_OTHER): Payer: Self-pay

## 2019-12-28 ENCOUNTER — Telehealth (INDEPENDENT_AMBULATORY_CARE_PROVIDER_SITE_OTHER): Payer: Self-pay

## 2019-12-28 ENCOUNTER — Other Ambulatory Visit (INDEPENDENT_AMBULATORY_CARE_PROVIDER_SITE_OTHER): Payer: Self-pay | Admitting: Nurse Practitioner

## 2019-12-28 ENCOUNTER — Encounter (INDEPENDENT_AMBULATORY_CARE_PROVIDER_SITE_OTHER): Payer: Self-pay | Admitting: Nurse Practitioner

## 2019-12-28 ENCOUNTER — Ambulatory Visit (INDEPENDENT_AMBULATORY_CARE_PROVIDER_SITE_OTHER): Payer: Medicaid Other

## 2019-12-28 ENCOUNTER — Other Ambulatory Visit: Payer: Self-pay

## 2019-12-28 ENCOUNTER — Ambulatory Visit (INDEPENDENT_AMBULATORY_CARE_PROVIDER_SITE_OTHER): Payer: Medicaid Other | Admitting: Nurse Practitioner

## 2019-12-28 VITALS — BP 115/77 | HR 101 | Ht 62.0 in | Wt 185.0 lb

## 2019-12-28 DIAGNOSIS — I10 Essential (primary) hypertension: Secondary | ICD-10-CM

## 2019-12-28 DIAGNOSIS — M79601 Pain in right arm: Secondary | ICD-10-CM | POA: Diagnosis not present

## 2019-12-28 DIAGNOSIS — T829XXS Unspecified complication of cardiac and vascular prosthetic device, implant and graft, sequela: Secondary | ICD-10-CM | POA: Diagnosis not present

## 2019-12-28 DIAGNOSIS — Z95820 Peripheral vascular angioplasty status with implants and grafts: Secondary | ICD-10-CM | POA: Diagnosis not present

## 2019-12-28 DIAGNOSIS — J449 Chronic obstructive pulmonary disease, unspecified: Secondary | ICD-10-CM

## 2019-12-28 DIAGNOSIS — F17200 Nicotine dependence, unspecified, uncomplicated: Secondary | ICD-10-CM | POA: Diagnosis not present

## 2019-12-28 DIAGNOSIS — IMO0001 Reserved for inherently not codable concepts without codable children: Secondary | ICD-10-CM

## 2019-12-28 NOTE — Telephone Encounter (Signed)
Patient was seen in office and is now scheduled with Dr. Gilda Crease for a RUE angio with a 9:00 am arrival time to the [facility]. Covid testing is on 12/31/19 before 11:00 am at the [facility]. Pre-procedure instructions were given to the patient.

## 2019-12-31 ENCOUNTER — Other Ambulatory Visit: Payer: Self-pay

## 2019-12-31 ENCOUNTER — Other Ambulatory Visit (INDEPENDENT_AMBULATORY_CARE_PROVIDER_SITE_OTHER): Payer: Self-pay | Admitting: Nurse Practitioner

## 2019-12-31 ENCOUNTER — Encounter (INDEPENDENT_AMBULATORY_CARE_PROVIDER_SITE_OTHER): Payer: Self-pay | Admitting: Nurse Practitioner

## 2019-12-31 ENCOUNTER — Other Ambulatory Visit
Admission: RE | Admit: 2019-12-31 | Discharge: 2019-12-31 | Disposition: A | Discharge status: Home / Self Care | Payer: Medicaid Other | Source: Ambulatory Visit | Attending: Vascular Surgery | Admitting: Vascular Surgery

## 2019-12-31 DIAGNOSIS — Z01812 Encounter for preprocedural laboratory examination: Secondary | ICD-10-CM | POA: Insufficient documentation

## 2019-12-31 DIAGNOSIS — Z20822 Contact with and (suspected) exposure to covid-19: Secondary | ICD-10-CM | POA: Insufficient documentation
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
## 2020-01-01 ENCOUNTER — Observation Stay: Payer: Medicaid Other | Admitting: Anesthesiology

## 2020-01-01 ENCOUNTER — Encounter
Admission: RE | Disposition: A | Discharge status: Home Health | Payer: Self-pay | Source: Home / Self Care | Attending: Vascular Surgery

## 2020-01-01 ENCOUNTER — Observation Stay: Payer: Medicaid Other

## 2020-01-01 ENCOUNTER — Encounter: Payer: Self-pay | Admitting: Vascular Surgery

## 2020-01-01 ENCOUNTER — Other Ambulatory Visit (INDEPENDENT_AMBULATORY_CARE_PROVIDER_SITE_OTHER): Payer: Self-pay | Admitting: Nurse Practitioner

## 2020-01-01 ENCOUNTER — Inpatient Hospital Stay
Admission: RE | Admit: 2020-01-01 | Discharge: 2020-01-04 | DRG: 254 | Disposition: A | Discharge status: Home Health | Payer: Medicaid Other | Attending: Vascular Surgery | Admitting: Vascular Surgery

## 2020-01-01 DIAGNOSIS — M1990 Unspecified osteoarthritis, unspecified site: Secondary | ICD-10-CM | POA: Diagnosis present

## 2020-01-01 DIAGNOSIS — Z4659 Encounter for fitting and adjustment of other gastrointestinal appliance and device: Secondary | ICD-10-CM

## 2020-01-01 DIAGNOSIS — J9600 Acute respiratory failure, unspecified whether with hypoxia or hypercapnia: Secondary | ICD-10-CM

## 2020-01-01 DIAGNOSIS — I771 Stricture of artery: Secondary | ICD-10-CM | POA: Diagnosis present

## 2020-01-01 DIAGNOSIS — I10 Essential (primary) hypertension: Secondary | ICD-10-CM | POA: Diagnosis present

## 2020-01-01 DIAGNOSIS — T82856A Stenosis of peripheral vascular stent, initial encounter: Secondary | ICD-10-CM | POA: Diagnosis not present

## 2020-01-01 DIAGNOSIS — J439 Emphysema, unspecified: Secondary | ICD-10-CM | POA: Diagnosis present

## 2020-01-01 DIAGNOSIS — Z9851 Tubal ligation status: Secondary | ICD-10-CM

## 2020-01-01 DIAGNOSIS — I998 Other disorder of circulatory system: Secondary | ICD-10-CM | POA: Diagnosis not present

## 2020-01-01 DIAGNOSIS — Z9049 Acquired absence of other specified parts of digestive tract: Secondary | ICD-10-CM

## 2020-01-01 DIAGNOSIS — I70228 Atherosclerosis of native arteries of extremities with rest pain, other extremity: Principal | ICD-10-CM | POA: Diagnosis present

## 2020-01-01 DIAGNOSIS — K219 Gastro-esophageal reflux disease without esophagitis: Secondary | ICD-10-CM | POA: Diagnosis present

## 2020-01-01 DIAGNOSIS — Z7951 Long term (current) use of inhaled steroids: Secondary | ICD-10-CM

## 2020-01-01 DIAGNOSIS — F419 Anxiety disorder, unspecified: Secondary | ICD-10-CM | POA: Diagnosis present

## 2020-01-01 DIAGNOSIS — Z7982 Long term (current) use of aspirin: Secondary | ICD-10-CM

## 2020-01-01 DIAGNOSIS — I742 Embolism and thrombosis of arteries of the upper extremities: Secondary | ICD-10-CM | POA: Diagnosis not present

## 2020-01-01 DIAGNOSIS — Z7952 Long term (current) use of systemic steroids: Secondary | ICD-10-CM

## 2020-01-01 DIAGNOSIS — Z4682 Encounter for fitting and adjustment of non-vascular catheter: Secondary | ICD-10-CM | POA: Diagnosis not present

## 2020-01-01 DIAGNOSIS — Z825 Family history of asthma and other chronic lower respiratory diseases: Secondary | ICD-10-CM

## 2020-01-01 DIAGNOSIS — I708 Atherosclerosis of other arteries: Secondary | ICD-10-CM

## 2020-01-01 DIAGNOSIS — Z818 Family history of other mental and behavioral disorders: Secondary | ICD-10-CM

## 2020-01-01 DIAGNOSIS — R9389 Abnormal findings on diagnostic imaging of other specified body structures: Secondary | ICD-10-CM

## 2020-01-01 DIAGNOSIS — Z888 Allergy status to other drugs, medicaments and biological substances status: Secondary | ICD-10-CM

## 2020-01-01 DIAGNOSIS — Z79899 Other long term (current) drug therapy: Secondary | ICD-10-CM

## 2020-01-01 DIAGNOSIS — F17210 Nicotine dependence, cigarettes, uncomplicated: Secondary | ICD-10-CM | POA: Diagnosis present

## 2020-01-01 DIAGNOSIS — J9811 Atelectasis: Secondary | ICD-10-CM | POA: Diagnosis not present

## 2020-01-01 DIAGNOSIS — Z886 Allergy status to analgesic agent status: Secondary | ICD-10-CM

## 2020-01-01 DIAGNOSIS — Z8249 Family history of ischemic heart disease and other diseases of the circulatory system: Secondary | ICD-10-CM

## 2020-01-01 HISTORY — PX: UPPER EXTREMITY ANGIOGRAPHY: CATH118270

## 2020-01-01 HISTORY — PX: THROMBECTOMY BRACHIAL ARTERY: SHX6649

## 2020-01-01 HISTORY — PX: ANGIOPLASTY: SHX39

## 2020-01-01 LAB — BLOOD GAS, ARTERIAL
Acid-base deficit: 0.2 mmol/L (ref 0.0–2.0)
Bicarbonate: 24.8 mmol/L (ref 20.0–28.0)
FIO2: 0.28
MECHVT: 500 mL
O2 Saturation: 95.9 %
PEEP: 5 cmH2O
Patient temperature: 37
RATE: 15 resp/min
pCO2 arterial: 41 mmHg (ref 32.0–48.0)
pH, Arterial: 7.39 (ref 7.350–7.450)
pO2, Arterial: 82 mmHg — ABNORMAL LOW (ref 83.0–108.0)

## 2020-01-01 LAB — CBC WITH DIFFERENTIAL/PLATELET
Abs Immature Granulocytes: 0.09 10*3/uL — ABNORMAL HIGH (ref 0.00–0.07)
Basophils Absolute: 0 10*3/uL (ref 0.0–0.1)
Basophils Relative: 0 %
Eosinophils Absolute: 0 10*3/uL (ref 0.0–0.5)
Eosinophils Relative: 0 %
HCT: 37.5 % (ref 36.0–46.0)
Hemoglobin: 12.8 g/dL (ref 12.0–15.0)
Immature Granulocytes: 1 %
Lymphocytes Relative: 7 %
Lymphs Abs: 1.1 10*3/uL (ref 0.7–4.0)
MCH: 30.4 pg (ref 26.0–34.0)
MCHC: 34.1 g/dL (ref 30.0–36.0)
MCV: 89.1 fL (ref 80.0–100.0)
Monocytes Absolute: 0.3 10*3/uL (ref 0.1–1.0)
Monocytes Relative: 2 %
Neutro Abs: 14.5 10*3/uL — ABNORMAL HIGH (ref 1.7–7.7)
Neutrophils Relative %: 90 %
Platelets: 179 10*3/uL (ref 150–400)
RBC: 4.21 MIL/uL (ref 3.87–5.11)
RDW: 14.5 % (ref 11.5–15.5)
WBC: 16.1 10*3/uL — ABNORMAL HIGH (ref 4.0–10.5)
nRBC: 0 % (ref 0.0–0.2)

## 2020-01-01 LAB — BASIC METABOLIC PANEL
Anion gap: 11 (ref 5–15)
BUN: 10 mg/dL (ref 6–20)
CO2: 25 mmol/L (ref 22–32)
Calcium: 8.5 mg/dL — ABNORMAL LOW (ref 8.9–10.3)
Chloride: 101 mmol/L (ref 98–111)
Creatinine, Ser: 0.84 mg/dL (ref 0.44–1.00)
GFR calc Af Amer: >60 mL/min (ref >60)
GFR calc non Af Amer: >60 mL/min (ref >60)
Glucose, Bld: 175 mg/dL — ABNORMAL HIGH (ref 70–99)
Potassium: 4.1 mmol/L (ref 3.5–5.1)
Sodium: 137 mmol/L (ref 135–145)

## 2020-01-01 LAB — CREATININE, SERUM
Creatinine, Ser: 0.77 mg/dL (ref 0.44–1.00)
GFR calc Af Amer: >60 mL/min (ref >60)
GFR calc non Af Amer: >60 mL/min (ref >60)

## 2020-01-01 LAB — GLUCOSE, CAPILLARY: Glucose-Capillary: 145 mg/dL — ABNORMAL HIGH (ref 70–99)

## 2020-01-01 LAB — BUN: BUN: 8 mg/dL (ref 6–20)

## 2020-01-01 LAB — MAGNESIUM: Magnesium: 1.7 mg/dL (ref 1.7–2.4)

## 2020-01-01 LAB — MRSA PCR SCREENING: MRSA by PCR: NEGATIVE

## 2020-01-01 LAB — PROTIME-INR
INR: 1.1 (ref 0.8–1.2)
Prothrombin Time: 14 seconds (ref 11.4–15.2)

## 2020-01-01 LAB — PHOSPHORUS: Phosphorus: 4 mg/dL (ref 2.5–4.6)

## 2020-01-01 LAB — SARS CORONAVIRUS 2 (TAT 6-24 HRS): SARS Coronavirus 2: NEGATIVE

## 2020-01-01 LAB — APTT: aPTT: 135 seconds — ABNORMAL HIGH (ref 24–36)

## 2020-01-01 SURGERY — THROMBECTOMY, ARTERY, BRACHIAL
Anesthesia: General | Laterality: Right

## 2020-01-01 SURGERY — UPPER EXTREMITY ANGIOGRAPHY
Anesthesia: Moderate Sedation | Laterality: Right

## 2020-01-01 MED ORDER — FENTANYL CITRATE (PF) 100 MCG/2ML IJ SOLN
INTRAMUSCULAR | Status: AC
  Filled 2020-01-01: qty 2

## 2020-01-01 MED ORDER — LIDOCAINE HCL (CARDIAC) PF 100 MG/5ML IV SOSY
PREFILLED_SYRINGE | INTRAVENOUS | Status: DC | PRN
  Administered 2020-01-01: 100 mg via INTRAVENOUS

## 2020-01-01 MED ORDER — LOSARTAN POTASSIUM-HCTZ 50-12.5 MG PO TABS: 1.0000 | ORAL_TABLET | Freq: Every day | ORAL | Status: DC

## 2020-01-01 MED ORDER — IOPAMIDOL (ISOVUE-300) INJECTION 61%
INTRAVENOUS | Status: DC | PRN
  Administered 2020-01-01: 50 mL via INTRA_ARTERIAL

## 2020-01-01 MED ORDER — FAMOTIDINE IN NACL 20-0.9 MG/50ML-% IV SOLN
20.0000 mg | Freq: Two times a day (BID) | INTRAVENOUS | Status: DC
  Administered 2020-01-01 – 2020-01-04 (×6): 20 mg via INTRAVENOUS
  Filled 2020-01-01 (×6): qty 50

## 2020-01-01 MED ORDER — PROPOFOL 1000 MG/100ML IV EMUL
0.0000 ug/kg/min | INTRAVENOUS | Status: DC
  Administered 2020-01-02 (×2): 30 ug/kg/min via INTRAVENOUS
  Filled 2020-01-01 (×2): qty 100

## 2020-01-01 MED ORDER — ONDANSETRON HCL 4 MG/2ML IJ SOLN: 4.0000 mg | Freq: Four times a day (QID) | INTRAMUSCULAR | Status: DC | PRN

## 2020-01-01 MED ORDER — HYDROCHLOROTHIAZIDE 12.5 MG PO CAPS
12.5000 mg | ORAL_CAPSULE | Freq: Every day | ORAL | Status: DC
  Filled 2020-01-01: qty 1

## 2020-01-01 MED ORDER — PROPOFOL 500 MG/50ML IV EMUL
INTRAVENOUS | Status: DC | PRN
Start: 2020-01-01 — End: 2020-01-01
  Administered 2020-01-01: 50 ug/kg/min via INTRAVENOUS

## 2020-01-01 MED ORDER — TIROFIBAN HCL IV 12.5 MG/250 ML
INTRAVENOUS | Status: AC
  Filled 2020-01-01: qty 250

## 2020-01-01 MED ORDER — HEPARIN SODIUM (PORCINE) 1000 UNIT/ML IJ SOLN
INTRAMUSCULAR | Status: DC | PRN
Start: 2020-01-01 — End: 2020-01-01
  Administered 2020-01-01: 1000 [IU] via INTRAVENOUS
  Administered 2020-01-01: 4000 [IU] via INTRAVENOUS

## 2020-01-01 MED ORDER — HYDROMORPHONE HCL 1 MG/ML IJ SOLN: 1.0000 mg | Freq: Once | INTRAMUSCULAR | Status: AC

## 2020-01-01 MED ORDER — HYDROMORPHONE HCL 1 MG/ML IJ SOLN
INTRAMUSCULAR | Status: AC
  Administered 2020-01-01: 1 mg via INTRAVENOUS
  Filled 2020-01-01: qty 1

## 2020-01-01 MED ORDER — ROCURONIUM BROMIDE 100 MG/10ML IV SOLN
INTRAVENOUS | Status: DC | PRN
  Administered 2020-01-01: 40 mg via INTRAVENOUS
  Administered 2020-01-01: 20 mg via INTRAVENOUS
  Administered 2020-01-01: 30 mg via INTRAVENOUS
  Administered 2020-01-01: 10 mg via INTRAVENOUS

## 2020-01-01 MED ORDER — MIDAZOLAM HCL 5 MG/5ML IJ SOLN
INTRAMUSCULAR | Status: AC
  Filled 2020-01-01: qty 5

## 2020-01-01 MED ORDER — SODIUM CHLORIDE 0.9 % IV SOLN: INTRAVENOUS | Status: DC | PRN

## 2020-01-01 MED ORDER — HEPARIN SODIUM (PORCINE) 1000 UNIT/ML IJ SOLN
INTRAMUSCULAR | Status: DC | PRN
  Administered 2020-01-01: 4000 [IU] via INTRAVENOUS
  Administered 2020-01-01: 1000 [IU] via INTRAVENOUS

## 2020-01-01 MED ORDER — CHLORHEXIDINE GLUCONATE 0.12% ORAL RINSE (MEDLINE KIT)
15.0000 mL | Freq: Two times a day (BID) | OROMUCOSAL | Status: DC
  Administered 2020-01-01 – 2020-01-03 (×4): 15 mL via OROMUCOSAL

## 2020-01-01 MED ORDER — METHYLPREDNISOLONE SODIUM SUCC 125 MG IJ SOLR: 125.0000 mg | Freq: Once | INTRAMUSCULAR | Status: DC | PRN

## 2020-01-01 MED ORDER — TIROFIBAN HCL IN NACL 5-0.9 MG/100ML-% IV SOLN: 0.1500 ug/kg/min | INTRAVENOUS | Status: DC

## 2020-01-01 MED ORDER — DIPHENHYDRAMINE HCL 50 MG/ML IJ SOLN: 50.0000 mg | Freq: Once | INTRAMUSCULAR | Status: DC | PRN

## 2020-01-01 MED ORDER — MIDAZOLAM HCL 2 MG/ML PO SYRP: 8.0000 mg | ORAL_SOLUTION | Freq: Once | ORAL | Status: DC | PRN

## 2020-01-01 MED ORDER — DIPHENHYDRAMINE HCL 50 MG/ML IJ SOLN
INTRAMUSCULAR | Status: DC | PRN
  Administered 2020-01-01: 25 mg via INTRAVENOUS

## 2020-01-01 MED ORDER — OXYCODONE HCL 5 MG PO TABS: 5.0000 mg | ORAL_TABLET | Freq: Four times a day (QID) | ORAL | Status: DC | PRN

## 2020-01-01 MED ORDER — FENTANYL CITRATE (PF) 100 MCG/2ML IJ SOLN
INTRAMUSCULAR | Status: DC | PRN
  Administered 2020-01-01: 50 ug via INTRAVENOUS
  Administered 2020-01-01: 25 ug via INTRAVENOUS
  Administered 2020-01-01 (×2): 50 ug via INTRAVENOUS
  Administered 2020-01-01 (×6): 25 ug via INTRAVENOUS

## 2020-01-01 MED ORDER — LACTATED RINGERS IV SOLN: INTRAVENOUS | Status: DC | PRN

## 2020-01-01 MED ORDER — PROPOFOL 10 MG/ML IV BOLUS
INTRAVENOUS | Status: DC | PRN
  Administered 2020-01-01: 150 mg via INTRAVENOUS

## 2020-01-01 MED ORDER — LOSARTAN POTASSIUM 50 MG PO TABS: 50.0000 mg | ORAL_TABLET | Freq: Every day | ORAL | Status: DC

## 2020-01-01 MED ORDER — PROPOFOL 10 MG/ML IV BOLUS
INTRAVENOUS | Status: AC
  Filled 2020-01-01: qty 20

## 2020-01-01 MED ORDER — HEPARIN SODIUM (PORCINE) 1000 UNIT/ML IJ SOLN
INTRAMUSCULAR | Status: AC
  Filled 2020-01-01: qty 1

## 2020-01-01 MED ORDER — ONDANSETRON HCL 4 MG/2ML IJ SOLN
INTRAMUSCULAR | Status: DC | PRN
  Administered 2020-01-01 (×2): 4 mg via INTRAVENOUS

## 2020-01-01 MED ORDER — FLUTICASONE FUROATE-VILANTEROL 200-25 MCG/INH IN AEPB
1.0000 | INHALATION_SPRAY | Freq: Every day | RESPIRATORY_TRACT | Status: DC
  Filled 2020-01-01: qty 28

## 2020-01-01 MED ORDER — IPRATROPIUM-ALBUTEROL 0.5-2.5 (3) MG/3ML IN SOLN: 3.0000 mL | Freq: Four times a day (QID) | RESPIRATORY_TRACT | Status: DC | PRN

## 2020-01-01 MED ORDER — FAMOTIDINE 20 MG PO TABS: 40.0000 mg | ORAL_TABLET | Freq: Once | ORAL | Status: DC | PRN

## 2020-01-01 MED ORDER — BUDESONIDE 0.5 MG/2ML IN SUSP
0.5000 mg | Freq: Two times a day (BID) | RESPIRATORY_TRACT | Status: DC
  Administered 2020-01-01 – 2020-01-04 (×6): 0.5 mg via RESPIRATORY_TRACT
  Filled 2020-01-01 (×6): qty 2

## 2020-01-01 MED ORDER — MIDAZOLAM HCL 2 MG/2ML IJ SOLN
INTRAMUSCULAR | Status: DC | PRN
  Administered 2020-01-01 (×4): 1 mg via INTRAVENOUS
  Administered 2020-01-01 (×2): 2 mg via INTRAVENOUS
  Administered 2020-01-01 (×2): 1 mg via INTRAVENOUS

## 2020-01-01 MED ORDER — SUCCINYLCHOLINE CHLORIDE 20 MG/ML IJ SOLN
INTRAMUSCULAR | Status: DC | PRN
  Administered 2020-01-01: 100 mg via INTRAVENOUS

## 2020-01-01 MED ORDER — MIDAZOLAM HCL 2 MG/2ML IJ SOLN
INTRAMUSCULAR | Status: DC | PRN
  Administered 2020-01-01: 2 mg via INTRAVENOUS

## 2020-01-01 MED ORDER — IODIXANOL 320 MG/ML IV SOLN
INTRAVENOUS | Status: DC | PRN
  Administered 2020-01-01: 70 mL

## 2020-01-01 MED ORDER — IODIXANOL 320 MG/ML IV SOLN
INTRAVENOUS | Status: DC | PRN
  Administered 2020-01-01: 15 mL via INTRA_ARTERIAL

## 2020-01-01 MED ORDER — PROPOFOL 1000 MG/100ML IV EMUL
INTRAVENOUS | Status: AC
  Administered 2020-01-01: 50 ug/kg/min via INTRAVENOUS
  Filled 2020-01-01: qty 100

## 2020-01-01 MED ORDER — POLYETHYLENE GLYCOL 3350 17 G PO PACK
17.0000 g | PACK | Freq: Every day | ORAL | Status: DC
  Administered 2020-01-03: 17 g via ORAL
  Filled 2020-01-01 (×2): qty 1

## 2020-01-01 MED ORDER — FENTANYL CITRATE (PF) 100 MCG/2ML IJ SOLN: 50.0000 ug | Freq: Once | INTRAMUSCULAR | Status: DC

## 2020-01-01 MED ORDER — FENTANYL CITRATE (PF) 100 MCG/2ML IJ SOLN: 12.5000 ug | Freq: Once | INTRAMUSCULAR | Status: DC | PRN

## 2020-01-01 MED ORDER — ALBUTEROL SULFATE (2.5 MG/3ML) 0.083% IN NEBU: 2.5000 mg | INHALATION_SOLUTION | RESPIRATORY_TRACT | Status: DC | PRN

## 2020-01-01 MED ORDER — KETOROLAC TROMETHAMINE 30 MG/ML IJ SOLN
30.0000 mg | Freq: Four times a day (QID) | INTRAMUSCULAR | Status: DC
  Administered 2020-01-01 – 2020-01-02 (×3): 30 mg via INTRAVENOUS
  Filled 2020-01-01 (×3): qty 1

## 2020-01-01 MED ORDER — TIOTROPIUM BROMIDE MONOHYDRATE 18 MCG IN CAPS
18.0000 ug | ORAL_CAPSULE | Freq: Every day | RESPIRATORY_TRACT | Status: DC
  Filled 2020-01-01: qty 5

## 2020-01-01 MED ORDER — ONDANSETRON HCL 4 MG PO TABS
4.0000 mg | ORAL_TABLET | Freq: Four times a day (QID) | ORAL | Status: DC | PRN
  Filled 2020-01-01: qty 1

## 2020-01-01 MED ORDER — CEFAZOLIN SODIUM-DEXTROSE 2-4 GM/100ML-% IV SOLN
INTRAVENOUS | Status: AC
  Administered 2020-01-01: 2 g via INTRAVENOUS
  Filled 2020-01-01: qty 100

## 2020-01-01 MED ORDER — ALBUTEROL SULFATE HFA 108 (90 BASE) MCG/ACT IN AERS: 2.0000 | INHALATION_SPRAY | Freq: Four times a day (QID) | RESPIRATORY_TRACT | Status: DC | PRN

## 2020-01-01 MED ORDER — MORPHINE SULFATE (PF) 4 MG/ML IV SOLN: 2.0000 mg | INTRAVENOUS | Status: DC | PRN

## 2020-01-01 MED ORDER — FENTANYL 2500MCG IN NS 250ML (10MCG/ML) PREMIX INFUSION: 50.0000 ug/h | INTRAVENOUS | Status: DC

## 2020-01-01 MED ORDER — HYDROMORPHONE HCL 1 MG/ML IJ SOLN
INTRAMUSCULAR | Status: AC
  Administered 2020-01-01: 1 mg
  Filled 2020-01-01: qty 1

## 2020-01-01 MED ORDER — DEXAMETHASONE SODIUM PHOSPHATE 10 MG/ML IJ SOLN
INTRAMUSCULAR | Status: DC | PRN
  Administered 2020-01-01: 10 mg via INTRAVENOUS

## 2020-01-01 MED ORDER — FENTANYL CITRATE (PF) 100 MCG/2ML IJ SOLN
INTRAMUSCULAR | Status: DC | PRN
  Administered 2020-01-01 (×4): 50 ug via INTRAVENOUS

## 2020-01-01 MED ORDER — MICROFIBRILLAR COLL HEMOSTAT EX PADS
MEDICATED_PAD | CUTANEOUS | Status: DC | PRN
  Administered 2020-01-01: 1 via TOPICAL

## 2020-01-01 MED ORDER — MIDAZOLAM HCL 2 MG/2ML IJ SOLN
INTRAMUSCULAR | Status: AC
  Filled 2020-01-01: qty 2

## 2020-01-01 MED ORDER — DIPHENHYDRAMINE HCL 50 MG/ML IJ SOLN
INTRAMUSCULAR | Status: AC
  Filled 2020-01-01: qty 1

## 2020-01-01 MED ORDER — SODIUM CHLORIDE 0.9 % IV SOLN: INTRAVENOUS | Status: DC

## 2020-01-01 MED ORDER — BUPIVACAINE LIPOSOME 1.3 % IJ SUSP
INTRAMUSCULAR | Status: DC | PRN
  Administered 2020-01-01: 20 mL

## 2020-01-01 MED ORDER — IPRATROPIUM-ALBUTEROL 0.5-2.5 (3) MG/3ML IN SOLN
3.0000 mL | RESPIRATORY_TRACT | Status: DC
  Administered 2020-01-01 – 2020-01-03 (×10): 3 mL via RESPIRATORY_TRACT
  Filled 2020-01-01 (×10): qty 3

## 2020-01-01 MED ORDER — DOCUSATE SODIUM 50 MG/5ML PO LIQD
100.0000 mg | Freq: Two times a day (BID) | ORAL | Status: DC
  Administered 2020-01-01 – 2020-01-03 (×4): 100 mg via ORAL
  Filled 2020-01-01 (×7): qty 10

## 2020-01-01 MED ORDER — FENTANYL BOLUS VIA INFUSION
50.0000 ug | INTRAVENOUS | Status: DC | PRN
  Administered 2020-01-02: 50 ug via INTRAVENOUS
  Filled 2020-01-01: qty 50

## 2020-01-01 MED ORDER — CHLORHEXIDINE GLUCONATE CLOTH 2 % EX PADS
6.0000 | MEDICATED_PAD | Freq: Every day | CUTANEOUS | Status: DC
  Administered 2020-01-01 – 2020-01-04 (×4): 6 via TOPICAL

## 2020-01-01 MED ORDER — TIROFIBAN (AGGRASTAT) BOLUS VIA INFUSION
25.0000 ug/kg | Freq: Once | INTRAVENOUS | Status: DC
  Filled 2020-01-01: qty 41

## 2020-01-01 MED ORDER — KETOROLAC TROMETHAMINE 60 MG/2ML IM SOLN
INTRAMUSCULAR | Status: AC
  Filled 2020-01-01: qty 2

## 2020-01-01 MED ORDER — HEPARIN (PORCINE) 25000 UT/250ML-% IV SOLN
850.0000 [IU]/h | INTRAVENOUS | Status: DC
  Administered 2020-01-01: 850 [IU]/h via INTRAVENOUS
  Filled 2020-01-01 (×2): qty 250

## 2020-01-01 MED ORDER — OXYCODONE HCL 5 MG PO TABS: 5.0000 mg | ORAL_TABLET | ORAL | Status: DC | PRN

## 2020-01-01 MED ORDER — SODIUM CHLORIDE 0.9 % IV SOLN: INTRAVENOUS | Status: AC

## 2020-01-01 MED ORDER — CEFAZOLIN SODIUM-DEXTROSE 1-4 GM/50ML-% IV SOLN
INTRAVENOUS | Status: DC | PRN
  Administered 2020-01-01: 1 g via INTRAVENOUS

## 2020-01-01 MED ORDER — FENTANYL 2500MCG IN NS 250ML (10MCG/ML) PREMIX INFUSION
INTRAVENOUS | Status: AC
  Administered 2020-01-01: 50 ug/h via INTRAVENOUS
  Filled 2020-01-01: qty 250

## 2020-01-01 MED ORDER — CEFAZOLIN SODIUM-DEXTROSE 2-4 GM/100ML-% IV SOLN: 2.0000 g | Freq: Once | INTRAVENOUS | Status: AC

## 2020-01-01 MED ORDER — MAGNESIUM SULFATE 2 GM/50ML IV SOLN
2.0000 g | Freq: Once | INTRAVENOUS | Status: AC
  Administered 2020-01-01: 2 g via INTRAVENOUS
  Filled 2020-01-01: qty 50

## 2020-01-01 MED ORDER — ORAL CARE MOUTH RINSE
15.0000 mL | OROMUCOSAL | Status: DC
  Administered 2020-01-01 – 2020-01-04 (×9): 15 mL via OROMUCOSAL

## 2020-01-01 SURGICAL SUPPLY — 67 items
BAG DECANTER FOR FLEXI CONT (MISCELLANEOUS) ×2 IMPLANT
BALLN LUTONIX DCB 7X40X130 (BALLOONS) ×2
BALLN ULTRVRSE 10X40X75 (BALLOONS) ×2
BALLN ULTRVRSE 8X40X75C (BALLOONS) ×2
BALLOON LUTONIX DCB 7X40X130 (BALLOONS) ×1 IMPLANT
BALLOON ULTRVRSE 10X40X75 (BALLOONS) ×1 IMPLANT
BALLOON ULTRVRSE 8X40X75C (BALLOONS) ×1 IMPLANT
BLADE SURG SZ11 CARB STEEL (BLADE) ×2 IMPLANT
BOOT SUTURE AID YELLOW STND (SUTURE) ×4 IMPLANT
BRUSH SCRUB EZ  4% CHG (MISCELLANEOUS) ×1
BRUSH SCRUB EZ 4% CHG (MISCELLANEOUS) ×1 IMPLANT
CATH BEACON 5 .035 65 KMP TIP (CATHETERS) ×2 IMPLANT
CATH EMBOLECTOMY 3X80 (CATHETERS) ×2 IMPLANT
CATH FOGERTY 4X80 WAS (CATHETERS) ×2 IMPLANT
CNTNR SPEC 2.5X3XGRAD LEK (MISCELLANEOUS) ×1
CONT SPEC 4OZ STER OR WHT (MISCELLANEOUS) ×1
CONTAINER SPEC 2.5X3XGRAD LEK (MISCELLANEOUS) ×1 IMPLANT
COVER WAND RF STERILE (DRAPES) ×2 IMPLANT
DERMABOND ADVANCED (GAUZE/BANDAGES/DRESSINGS) ×1
DERMABOND ADVANCED .7 DNX12 (GAUZE/BANDAGES/DRESSINGS) ×1 IMPLANT
DEVICE PRESTO INFLATION (MISCELLANEOUS) ×2 IMPLANT
DRESSING SURGICEL FIBRLLR 1X2 (HEMOSTASIS) ×1 IMPLANT
DRSG SURGICEL FIBRILLAR 1X2 (HEMOSTASIS) ×2
DRSG TELFA 4X3 1S NADH ST (GAUZE/BANDAGES/DRESSINGS) ×2 IMPLANT
ELECT CAUTERY BLADE 6.4 (BLADE) ×2 IMPLANT
ELECT REM PT RETURN 9FT ADLT (ELECTROSURGICAL) ×2
ELECTRODE REM PT RTRN 9FT ADLT (ELECTROSURGICAL) ×1 IMPLANT
GLIDEWIRE STIFF .35X180X3 HYDR (WIRE) ×2 IMPLANT
GLOVE SURG SYN 8.0 (GLOVE) ×2 IMPLANT
GOWN STRL REUS W/ TWL LRG LVL3 (GOWN DISPOSABLE) ×1 IMPLANT
GOWN STRL REUS W/ TWL XL LVL3 (GOWN DISPOSABLE) ×1 IMPLANT
GOWN STRL REUS W/TWL LRG LVL3 (GOWN DISPOSABLE) ×1
GOWN STRL REUS W/TWL XL LVL3 (GOWN DISPOSABLE) ×1
IV NS 500ML (IV SOLUTION) ×1
IV NS 500ML BAXH (IV SOLUTION) ×1 IMPLANT
KIT TURNOVER KIT A (KITS) ×2 IMPLANT
LABEL OR SOLS (LABEL) ×2 IMPLANT
LOOP RED MAXI  1X406MM (MISCELLANEOUS) ×2
LOOP VESSEL MAXI 1X406 RED (MISCELLANEOUS) ×2 IMPLANT
LOOP VESSEL MINI 0.8X406 BLUE (MISCELLANEOUS) ×1 IMPLANT
LOOPS BLUE MINI 0.8X406MM (MISCELLANEOUS) ×1
NEEDLE HYPO 25X1 1.5 SAFETY (NEEDLE) ×2 IMPLANT
NS IRRIG 500ML POUR BTL (IV SOLUTION) ×2 IMPLANT
PACK EXTREMITY (MISCELLANEOUS) ×2 IMPLANT
SHEATH BRITE TIP 6FRX11 (SHEATH) ×2 IMPLANT
SHEATH BRITE TIP 8FRX11 (SHEATH) ×2 IMPLANT
SHEATH RAABE 6FR (SHEATH) ×2 IMPLANT
STENT FLUENCY 12X40X80 (Permanent Stent) ×2 IMPLANT
STENT LIFESTAR 12X60 (Permanent Stent) ×2 IMPLANT
STENT LIFESTAR 9X30 (Permanent Stent) ×2 IMPLANT
STENT LIFESTAR 9X40 (Permanent Stent) IMPLANT
STOCKINETTE STRL 4IN 9604848 (GAUZE/BANDAGES/DRESSINGS) ×2 IMPLANT
SUT MNCRL 4-0 (SUTURE) ×1
SUT MNCRL 4-0 27XMFL (SUTURE) ×1
SUT PROLENE 6 0 BV (SUTURE) ×14 IMPLANT
SUT SILK 2 0 (SUTURE) ×1
SUT SILK 2-0 18XBRD TIE 12 (SUTURE) ×1 IMPLANT
SUT SILK 3 0 (SUTURE) ×1
SUT SILK 3-0 18XBRD TIE 12 (SUTURE) ×1 IMPLANT
SUT SILK 4 0 (SUTURE) ×1
SUT SILK 4-0 18XBRD TIE 12 (SUTURE) ×1 IMPLANT
SUT VIC AB 3-0 SH 27 (SUTURE) ×1
SUT VIC AB 3-0 SH 27X BRD (SUTURE) ×1 IMPLANT
SUTURE MNCRL 4-0 27XMF (SUTURE) ×1 IMPLANT
SYR 20ML LL LF (SYRINGE) ×2 IMPLANT
WIRE MAGIC TOR.035 180C (WIRE) ×2 IMPLANT
WIRE MAGIC TORQUE 260C (WIRE) ×2 IMPLANT

## 2020-01-01 SURGICAL SUPPLY — 31 items
BALLN LUTONIX 4X220X130 (BALLOONS) ×2
BALLN LUTONIX DCB 6X100X130 (BALLOONS) ×2
BALLN LUTONIX DCB 6X80X130 (BALLOONS) ×2
BALLN MUSTANG 8X20X135 (BALLOONS) ×2
BALLOON LUTONIX 4X220X130 (BALLOONS) ×1 IMPLANT
BALLOON LUTONIX DCB 6X100X130 (BALLOONS) ×1 IMPLANT
BALLOON LUTONIX DCB 6X80X130 (BALLOONS) ×1 IMPLANT
BALLOON MUSTANG 8X20X135 (BALLOONS) ×1 IMPLANT
CATH ANGIO 5F 100CM .035 PIG (CATHETERS) ×2 IMPLANT
CATH INFINITI JR4 5F (CATHETERS) ×2 IMPLANT
CATH VERT 5FR 125CM (CATHETERS) ×2 IMPLANT
DEVICE PRESTO INFLATION (MISCELLANEOUS) ×2 IMPLANT
DEVICE STARCLOSE SE CLOSURE (Vascular Products) ×2 IMPLANT
DEVICE TORQUE .025-.038 (MISCELLANEOUS) ×2 IMPLANT
GLIDEWIRE ADV .035X260CM (WIRE) ×2 IMPLANT
GLIDEWIRE ANGLED SS 035X260CM (WIRE) ×2 IMPLANT
GUIDEWIRE SUPER STIFF .035X180 (WIRE) ×2 IMPLANT
KIT CV MULTILUMEN 7FR 20 (SET/KITS/TRAYS/PACK) ×2
KIT CV MULTILUMEN 7FR 20 SUB (SET/KITS/TRAYS/PACK) ×1 IMPLANT
NEEDLE ENTRY 21GA 7CM ECHOTIP (NEEDLE) ×2 IMPLANT
PACK ANGIOGRAPHY (CUSTOM PROCEDURE TRAY) ×2 IMPLANT
SET INTRO CAPELLA COAXIAL (SET/KITS/TRAYS/PACK) ×2 IMPLANT
SHEATH BRITE TIP 5FRX11 (SHEATH) ×2 IMPLANT
SHEATH BRITE TIP 8FRX11 (SHEATH) ×2 IMPLANT
SHEATH DESTINATION 8FR 90CM (SHEATH) ×2 IMPLANT
SHEATH SHUTTLE 6FRX80 (SHEATH) ×2 IMPLANT
TUBING CONTRAST HIGH PRESS 72 (TUBING) ×2 IMPLANT
VALVE CHECKFLO PERFORMER (SHEATH) ×2 IMPLANT
WIRE AMPLATZ SSTIFF .035X260CM (WIRE) ×2 IMPLANT
WIRE J 3MM .035X145CM (WIRE) ×2 IMPLANT
WIRE MAGIC TORQUE 315CM (WIRE) ×2 IMPLANT
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
## 2020-01-01 NOTE — H&P (Signed)
[facility] History & Physical Update  The patient was interviewed and re-examined.  The patient's previous History and Physical has been reviewed and is unchanged.  There is no change in the plan of care. We plan to proceed with the scheduled procedure.  Levora Dredge, MD  01/01/2020, 12:50 PM

## 2020-01-01 NOTE — Consult Note (Signed)
ANTICOAGULATION CONSULT NOTE  Pharmacy Consult for Heparin Indication: peripheral vascular disease    Allergies  Allergen Reactions  . Acetaminophen     Avoid due to fatty liver   . Nsaids Other (See Comments)    irritates stomach severley   . Other     Steroids-muscle weakness and headache  . Sular [Nisoldipine Er] Palpitations    Patient Measurements: Height: 5' 2.5" (158.8 cm) Weight: 81.6 kg (180 lb) IBW/kg (Calculated) : 51.25 Heparin Dosing Weight: 69.3 kg  Vital Signs: Temp: 98.8 F (37.1 C) (06/22 1020) Temp Source: Oral (06/22 1020) BP: 111/90 (06/22 1545) Pulse Rate: 96 (06/22 1545)  Labs: Recent Labs    01/01/20 1029  CREATININE 0.77    Estimated Creatinine Clearance: 74.8 mL/min (by C-G formula based on SCr of 0.77 mg/dL).   Medical History: Past Medical History:  Diagnosis Date  . Allergy   . Anxiety   . Arthritis   . Asthma   . Complication of anesthesia   . COPD (chronic obstructive pulmonary disease) (HCC)   . Emphysema of lung (HCC)   . GERD (gastroesophageal reflux disease)   . History of hiatal hernia   . Hypertension   . Hyperthyroidism    h/o  . Neuromuscular disorder (HCC)   . PONV (postoperative nausea and vomiting)   . Thyroid disease     Medications:  Medications Prior to Admission  Medication Sig Dispense Refill Last Dose  . ADVAIR DISKUS 250-50 MCG/DOSE AEPB INHALE 1 PUFF INTO THE LUNGS TWICE DAILY (Patient taking differently: Inhale 1 puff into the lungs 2 (two) times daily. ) 60 each 1 01/01/2020 at Unknown time  . albuterol (PROVENTIL) (2.5 MG/3ML) 0.083% nebulizer solution Take 3 mLs (2.5 mg total) by nebulization every 4 (four) hours as needed for wheezing or shortness of breath. 75 mL 12 Past Month at Unknown time  . Ascorbic Acid (VITAMIN C PO) Take 1 tablet by mouth daily.   12/31/2019 at Unknown time  . aspirin EC 81 MG tablet Take 81 mg by mouth daily.   Past Week at Unknown time  . Aspirin-Caffeine (BC FAST PAIN  RELIEF PO) Take 1 packet by mouth daily as needed (pain).   Past Month at Unknown time  . cetirizine (ZYRTEC) 10 MG tablet Take 10 mg by mouth daily.   01/01/2020 at Unknown time  . cholecalciferol (VITAMIN D3) 25 MCG (1000 UNIT) tablet Take 1,000 Units by mouth daily.   01/01/2020 at Unknown time  . diphenhydrAMINE HCl (ZZZQUIL) 50 MG/30ML LIQD Take 50 mg by mouth at bedtime.   12/31/2019 at Unknown time  . losartan-hydrochlorothiazide (HYZAAR) 50-12.5 MG tablet Take 1 tablet by mouth daily. 90 tablet 1 01/01/2020 at Unknown time  . Multiple Vitamin (MULTIVITAMIN WITH MINERALS) TABS tablet Take 1 tablet by mouth daily.   12/31/2019 at Unknown time  . PROAIR HFA 108 (90 Base) MCG/ACT inhaler INHALE 2 PUFFS INTO THE LUNGS EVERY 6 HOURS AS NEEDED FOR WHEEZING OR SHORTNESS OF BREATH (Patient taking differently: Inhale 2 puffs into the lungs every 6 (six) hours as needed for wheezing or shortness of breath. ) 17 g 1 01/01/2020 at Unknown time  . SPIRIVA HANDIHALER 18 MCG inhalation capsule INHALE CONTENTS OF 1 CAPSULE ONCE DAILY USING HANDIHALER (Patient taking differently: Place 18 mcg into inhaler and inhale daily. ) 90 capsule 1 01/01/2020 at Unknown time  . SUBOXONE 8-2 MG FILM Take 1 Film by mouth in the morning and at bedtime.    12/31/2019 at  Unknown time  . triamcinolone (NASACORT ALLERGY 24HR) 55 MCG/ACT AERO nasal inhaler Place 1 spray into the nose daily as needed (allergies).    12/31/2019 at Unknown time  . nicotine (NICOTROL) 10 MG inhaler Inhale 1 Cartridge (1 continuous puffing total) into the lungs as needed for smoking cessation. (Patient not taking: Reported on 12/31/2019) 42 each 0 Not Taking at Unknown time  . predniSONE (DELTASONE) 20 MG tablet Take 1 tablet (20 mg total) by mouth daily with breakfast. 7 days (Patient not taking: Reported on 12/31/2019) 7 tablet 1 Completed Course  . sertraline (ZOLOFT) 50 MG tablet Take 1 tablet (50 mg total) by mouth daily. (Patient not taking: Reported on  12/31/2019) 90 tablet 1 Not Taking at Unknown time   Scheduled:  . diphenhydrAMINE      . fentaNYL      . fentaNYL      . fentaNYL      . fentaNYL      . fluticasone furoate-vilanterol  1 puff Inhalation Daily  . ketorolac  30 mg Intravenous Q6H  . ketorolac      . losartan-hydrochlorothiazide  1 tablet Oral Daily  . midazolam      . midazolam      . tiotropium  18 mcg Inhalation Daily   Infusions:  . sodium chloride 0  (01/01/20 1608)   PRN: albuterol, ondansetron **OR** ondansetron (ZOFRAN) IV  Assessment: Pharmacy consulted to start heparin for PVD. No initial bolus per MD.   Goal of Therapy:  Heparin level 0.3-0.7 units/ml Monitor platelets by anticoagulation protocol: Yes   Plan:  Start heparin infusion at 850 units/hr Check anti-Xa level in 6 hours and daily while on heparin Continue to monitor H&H and platelets  Oswald Hillock, PharmD, BCPS 01/01/2020,7:37 PM

## 2020-01-01 NOTE — Progress Notes (Signed)
Patient presented to the recovery area complaining of pain to the right arm. Stating it was a 10 out of 10 pain, shaking, tearful and unable to lye still. Prior to patient going for procedure she was not complaining of pain to the right arm and 1+ radial pulse was felt. Once patient returned to the recovery area no pulse was felt or able to be dopplered, mid forearm down was pale and cool to the touch. Raul Del, PA and Dr. Gilda Crease presented to bedside to assess. It was determined that central line was needed as many IV attempts had been done, and the patient should be taken to the OR for emergency surgery.  Romelle Starcher, RN [facility] 01/01/2020
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
## 2020-01-01 NOTE — Anesthesia Preprocedure Evaluation (Addendum)
Anesthesia Evaluation  Patient identified by MRN, date of birth, ID band Patient awake    Reviewed: Allergy & Precautions, NPO status , Patient's Chart, lab work & pertinent test results  History of Anesthesia Complications (+) PONV and history of anesthetic complications  Airway Mallampati: III       Dental  (+) Upper Dentures, Lower Dentures, Edentulous Upper, Edentulous Lower, Dental Advidsory Given   Pulmonary shortness of breath and with exertion, asthma , neg sleep apnea, COPD,  COPD inhaler, neg recent URI, Current Smoker,     + decreased breath sounds      Cardiovascular hypertension, Pt. on medications (-) angina+ Peripheral Vascular Disease  (-) Past MI and (-) DVT Normal cardiovascular exam(-) dysrhythmias (-) Valvular Problems/Murmurs     Neuro/Psych neg Seizures Anxiety    GI/Hepatic Neg liver ROS, hiatal hernia, GERD  Poorly Controlled,  Endo/Other  neg diabetesHyperthyroidism (hx, no problems now)   Renal/GU negative Renal ROS     Musculoskeletal   Abdominal   Peds  Hematology   Anesthesia Other Findings Past Medical History: No date: Allergy No date: Anxiety No date: Arthritis No date: Asthma No date: Complication of anesthesia No date: COPD (chronic obstructive pulmonary disease) (HCC) No date: Emphysema of lung (HCC) No date: GERD (gastroesophageal reflux disease) No date: History of hiatal hernia No date: Hypertension No date: Hyperthyroidism     Comment:  h/o No date: Neuromuscular disorder (HCC) No date: PONV (postoperative nausea and vomiting) No date: Thyroid disease  Patient is coming emergently up from the vascular lab secondary to a cold right arm.  She had a brachial thrombectomy downstairs and afterwards lost pulses and developed a cold arm in specials recovery.  Patient has been NPO and we will proceed emergently to the OR to attempt revascularization.   Reproductive/Obstetrics negative OB ROS                           Anesthesia Physical  Anesthesia Plan  ASA: III and emergent  Anesthesia Plan: General   Post-op Pain Management:    Induction: Intravenous  PONV Risk Score and Plan: 3 and Ondansetron, Dexamethasone, Midazolam and Treatment may vary due to age or medical condition  Airway Management Planned: Oral ETT  Additional Equipment:   Intra-op Plan:   Post-operative Plan: Extubation in OR  Informed Consent: I have reviewed the patients History and Physical, chart, labs and discussed the procedure including the risks, benefits and alternatives for the proposed anesthesia with the patient or authorized representative who has indicated his/her understanding and acceptance.       Plan Discussed with:   Anesthesia Plan Comments:        Anesthesia Quick Evaluation

## 2020-01-01 NOTE — Transfer of Care (Signed)
Immediate Anesthesia Transfer of Care Note  Patient: Christy Mason  Procedure(s) Performed: THROMBECTOMY BRACHIAL AXILLARY ARTERY (Right ) ANGIOPLASTY ans STENT PLACEMENT RIGHT SUBCLAVIAN AND AXILLARY ARTERY (Right )  Patient Location: PACU and ICU  Anesthesia Type:General  Level of Consciousness: Patient remains intubated per anesthesia plan  Airway & Oxygen Therapy: Patient placed on Ventilator (see vital sign flow sheet for setting)  Post-op Assessment: Report given to RN  Post vital signs: Reviewed and stable  Last Vitals:  Vitals Value Taken Time  BP 161/102 01/01/20 1942  Temp    Pulse 80 01/01/20 1945  Resp 15 01/01/20 1945  SpO2 100 % 01/01/20 1945  Vitals shown include unvalidated device data.  Last Pain:  Vitals:   01/01/20 1545  TempSrc:   PainSc: 10-Worst pain ever      Patients Stated Pain Goal: 0 (01/01/20 1506)  Complications: No complications documented.

## 2020-01-01 NOTE — Plan of Care (Signed)
Pt intubated/sedated

## 2020-01-01 NOTE — Op Note (Signed)
OPERATIVE NOTE   PROCEDURE: 1.  Thrombectomy of the right ulnar artery with a #2 Fogarty embolectomy catheter. 2.  Thrombectomy of the right brachial artery and axillary artery with a #2 Fogarty embolectomy catheter. 3.  Open angioplasty and stent placement of the right subclavian and proximal axillary artery brachial artery approach retrograde  PRE-OPERATIVE DIAGNOSIS: Ischemia right hand  POST-OPERATIVE DIAGNOSIS: Same  SURGEON: Hortencia Pilar  ASSISTANT(S): Ms. Hezzie Bump  ANESTHESIA: general  ESTIMATED BLOOD LOSS: 50 cc  FINDING(S): Thrombus identified in the ulnar artery as well as in the brachial and axillary arteries.  Greater than 90% stenosis noted in the existing stent prior to reintervention at both the proximal portion as well as in the distal portion extending into the axillary artery.  SPECIMEN(S): Thrombus to pathology as one specimen  INDICATIONS:   Christy Mason is a 59 y.o. female who presents with atherosclerotic changes of the right arm in association with reocclusion of a subclavian stent placed remotely.  Attempts at minimally invasive revascularization were not successful and given the patient's rest pain and continued hand pain she is being taken to the operating room for open repair emergently.  Risks and benefits were discussed with her family all questions were answered all wished for [place] to proceed and do what we can to save her hand from amputation.  DESCRIPTION: After full informed written consent was obtained from the patient, the patient was brought back to the operating room and placed supine upon the operating table.  Prior to induction, the patient received IV antibiotics.   After obtaining adequate anesthesia, the patient was then prepped and draped in the standard fashion for a thromboembolectomy and intervention of the right arm from the brachial approach.  Linear incision is created just above the antecubital crease overlying the brachial  artery and dissection is carried down through the soft tissues to expose the neurovascular bundle which is then opened and the brachial artery identified and looped proximally and distally.  4000 units of heparin is given.  An additional 1000 units is given later in the case.  A #3 Fogarty is opened onto the field and prepped.  Silastic Vesseloops were then placed proximally and distally on the brachial artery and a transverse arteriotomy is created with an 11 blade scalpel.  The #3 Fogarty is then advanced to 30 cm down the ulnar artery.  3 passes are made the first to retrieve organized thrombus.  The third pass was clear and I irrigated the ulnar artery with heparinized saline.  The Silastic vessel loop was secured again and attention was turned to the brachial artery and the axillary artery.  Again the #3 Fogarty was utilized on this occasion a total of 5 passes was made returning thrombotic material and organized material on the first 4 passes.  Fairly brisk inflow was reestablished.  Again the brachial and axillary arteries are irrigated with heparinized saline the vessel loop is secured.  We then transitioned to interventional techniques a J-wire was advanced under direct visualization into the brachial artery in a retrograde fashion this was visualized with the C arm.  A 6 French sheath was then inserted.  A Kumpe catheter and Glidewire were then negotiated through the subclavian stents and hand-injection contrast through the Kumpe catheter is used to verify intraluminal positioning in the innominate vein.  The Magic torque wire was then advanced through the Kumpe catheter.  Using the image from the Kumpe catheter measurements were made of the proximal subclavian  this is proximal to the previous stents and after measuring it using the Magic torque wire as a reference it appears to be 8 mm in diameter.  I therefore selected a 12 mm x 60 mm life star stent and deployed this beginning approximately 1 cm  proximal to the existing stent.  I posted the stent with a 8 mm balloon inflations were to 8 to 10 atm for 30 seconds.  Follow-up imaging demonstrated a high-grade greater than 60% stenosis in the midportion of the stent this is within the old stent and is likely hyperplastic.  I therefore used a 12 mm x 40 mm fluency stent deployed across this and posted with a 10 mm balloon inflated to 10 atm for 30 seconds.  Follow-up imaging now demonstrated the proximal two thirds of the subclavian is patent with less than 10% residual stenosis however distally there is what appears to be a hyperplastic lesion extending into the axillary.  This has not responded to earlier angioplasties and therefore I covered this with a 8 mm x 30 mm life star stent and this was postdilated dilated with a 7 mm x 40 mm Lutonix drug-eluting balloon inflated to 6 atm for 1 minute.  Follow-up imaging beginning in the proximal subclavian and performed by hand-injection serially all the way down to the arteriotomy now demonstrated the subclavian axillary and brachial arteries are widely patent with less than 10% residual stenosis throughout.  Releasing the Silastic vessel loop demonstrated excellent inflow.  At this point the wire and sheath were removed and we transition back to conventional surgical techniques and closed the brachial arteriotomy using interrupted 6-0 Prolene a total of 5 sutures were used.  There was excellent forward and backbleeding.  Doppler was delivered onto the surgical field and distal to the arteriotomy there was a triphasic signal.  At the level of the wrist over the ulnar distribution there is a strong biphasic to triphasic signal.  At this point I elected to close.  The surgical wound was then irrigated with 50 cc of saline.  A total of 20 cc of Exparel was then infiltrated into the soft tissues of the wound.  Fibrillar was placed over the suture line.  A 3-0 Vicryl was used in a running fashion to reapproximate  the subcutaneous tissues and the skin was closed with a 4-0 Monocryl subcuticular and Dermabond.  The patient tolerated this procedure and was hemodynamically stable throughout.   COMPLICATIONS: None  CONDITION: [facility]  Office: [phone]   01/01/2020, 8:18 PM

## 2020-01-01 NOTE — Anesthesia Procedure Notes (Signed)
Procedure Name: Intubation Date/Time: 01/01/2020 4:10 PM Performed by: Nelda Marseille, CRNA Pre-anesthesia Checklist: Patient identified, Patient being monitored, Timeout performed, Emergency Drugs available and Suction available Patient Re-evaluated:Patient Re-evaluated prior to induction Oxygen Delivery Method: Circle system utilized Preoxygenation: Pre-oxygenation with 100% oxygen Induction Type: IV induction Ventilation: Mask ventilation without difficulty Laryngoscope Size: Mac, 3 and McGraph Grade View: Grade II Tube type: Oral Tube size: 7.0 mm Number of attempts: 1 Airway Equipment and Method: Stylet Placement Confirmation: ETT inserted through vocal cords under direct vision,  positive ETCO2 and breath sounds checked- equal and bilateral Secured at: 21 cm Tube secured with: Tape Dental Injury: Teeth and Oropharynx as per pre-operative assessment
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
## 2020-01-01 NOTE — Progress Notes (Signed)
Called to the recovery room to reevaluate the patient who has begun complaining of increasing pain and coldness of her right hand.  Examination demonstrates a pale hand with no palpable pulse.  Doppler signals are very faint.  The patient appears to have reoccluded her intervention.  She has an ischemic hand.  At this point I believe that she requires emergent intervention which will be performed in the operating room under general anesthesia.  Family is at the bedside and this was discussed in detail with them as the patient has received an large amount of narcotics as well as benzodiazepine.  Family member agrees with proceeding with surgery for limb salvage.  Possibility of limb loss has been discussed with both the patient and family member.  We will proceed to the operating room as soon as the operating room is ready.

## 2020-01-02 ENCOUNTER — Encounter: Payer: Self-pay | Admitting: Vascular Surgery

## 2020-01-02 ENCOUNTER — Observation Stay: Payer: Medicaid Other

## 2020-01-02 DIAGNOSIS — I708 Atherosclerosis of other arteries: Secondary | ICD-10-CM | POA: Diagnosis not present

## 2020-01-02 DIAGNOSIS — M1990 Unspecified osteoarthritis, unspecified site: Secondary | ICD-10-CM | POA: Diagnosis present

## 2020-01-02 DIAGNOSIS — J449 Chronic obstructive pulmonary disease, unspecified: Secondary | ICD-10-CM | POA: Diagnosis not present

## 2020-01-02 DIAGNOSIS — J9601 Acute respiratory failure with hypoxia: Secondary | ICD-10-CM | POA: Diagnosis not present

## 2020-01-02 DIAGNOSIS — Z886 Allergy status to analgesic agent status: Secondary | ICD-10-CM | POA: Diagnosis not present

## 2020-01-02 DIAGNOSIS — I10 Essential (primary) hypertension: Secondary | ICD-10-CM | POA: Diagnosis present

## 2020-01-02 DIAGNOSIS — Z79899 Other long term (current) drug therapy: Secondary | ICD-10-CM | POA: Diagnosis not present

## 2020-01-02 DIAGNOSIS — J439 Emphysema, unspecified: Secondary | ICD-10-CM | POA: Diagnosis present

## 2020-01-02 DIAGNOSIS — Z9049 Acquired absence of other specified parts of digestive tract: Secondary | ICD-10-CM | POA: Diagnosis not present

## 2020-01-02 DIAGNOSIS — Z8249 Family history of ischemic heart disease and other diseases of the circulatory system: Secondary | ICD-10-CM | POA: Diagnosis not present

## 2020-01-02 DIAGNOSIS — Z9851 Tubal ligation status: Secondary | ICD-10-CM | POA: Diagnosis not present

## 2020-01-02 DIAGNOSIS — Z7982 Long term (current) use of aspirin: Secondary | ICD-10-CM | POA: Diagnosis not present

## 2020-01-02 DIAGNOSIS — I70228 Atherosclerosis of native arteries of extremities with rest pain, other extremity: Secondary | ICD-10-CM | POA: Diagnosis present

## 2020-01-02 DIAGNOSIS — F17210 Nicotine dependence, cigarettes, uncomplicated: Secondary | ICD-10-CM | POA: Diagnosis present

## 2020-01-02 DIAGNOSIS — K219 Gastro-esophageal reflux disease without esophagitis: Secondary | ICD-10-CM | POA: Diagnosis present

## 2020-01-02 DIAGNOSIS — Z7951 Long term (current) use of inhaled steroids: Secondary | ICD-10-CM | POA: Diagnosis not present

## 2020-01-02 DIAGNOSIS — Z7952 Long term (current) use of systemic steroids: Secondary | ICD-10-CM | POA: Diagnosis not present

## 2020-01-02 DIAGNOSIS — I771 Stricture of artery: Secondary | ICD-10-CM | POA: Diagnosis present

## 2020-01-02 DIAGNOSIS — R9389 Abnormal findings on diagnostic imaging of other specified body structures: Secondary | ICD-10-CM | POA: Diagnosis not present

## 2020-01-02 DIAGNOSIS — J90 Pleural effusion, not elsewhere classified: Secondary | ICD-10-CM | POA: Diagnosis not present

## 2020-01-02 DIAGNOSIS — F419 Anxiety disorder, unspecified: Secondary | ICD-10-CM | POA: Diagnosis present

## 2020-01-02 DIAGNOSIS — M79641 Pain in right hand: Secondary | ICD-10-CM | POA: Diagnosis present

## 2020-01-02 DIAGNOSIS — Z825 Family history of asthma and other chronic lower respiratory diseases: Secondary | ICD-10-CM | POA: Diagnosis not present

## 2020-01-02 DIAGNOSIS — J9600 Acute respiratory failure, unspecified whether with hypoxia or hypercapnia: Secondary | ICD-10-CM | POA: Diagnosis not present

## 2020-01-02 DIAGNOSIS — I998 Other disorder of circulatory system: Secondary | ICD-10-CM | POA: Diagnosis present

## 2020-01-02 DIAGNOSIS — Z888 Allergy status to other drugs, medicaments and biological substances status: Secondary | ICD-10-CM | POA: Diagnosis not present

## 2020-01-02 DIAGNOSIS — Z818 Family history of other mental and behavioral disorders: Secondary | ICD-10-CM | POA: Diagnosis not present

## 2020-01-02 DIAGNOSIS — Z4659 Encounter for fitting and adjustment of other gastrointestinal appliance and device: Secondary | ICD-10-CM | POA: Diagnosis not present

## 2020-01-02 LAB — MAGNESIUM: Magnesium: 2.6 mg/dL — ABNORMAL HIGH (ref 1.7–2.4)

## 2020-01-02 LAB — CBC
HCT: 37.7 % (ref 36.0–46.0)
Hemoglobin: 12.4 g/dL (ref 12.0–15.0)
MCH: 30 pg (ref 26.0–34.0)
MCHC: 32.9 g/dL (ref 30.0–36.0)
MCV: 91.1 fL (ref 80.0–100.0)
Platelets: 183 10*3/uL (ref 150–400)
RBC: 4.14 MIL/uL (ref 3.87–5.11)
RDW: 14.4 % (ref 11.5–15.5)
WBC: 14.4 10*3/uL — ABNORMAL HIGH (ref 4.0–10.5)
nRBC: 0 % (ref 0.0–0.2)

## 2020-01-02 LAB — BLOOD GAS, ARTERIAL
Acid-Base Excess: 1.9 mmol/L (ref 0.0–2.0)
Bicarbonate: 25.6 mmol/L (ref 20.0–28.0)
FIO2: 0.28
O2 Saturation: 93 %
Patient temperature: 37
pCO2 arterial: 36 mmHg (ref 32.0–48.0)
pH, Arterial: 7.46 — ABNORMAL HIGH (ref 7.350–7.450)
pO2, Arterial: 63 mmHg — ABNORMAL LOW (ref 83.0–108.0)

## 2020-01-02 LAB — GLUCOSE, CAPILLARY
Glucose-Capillary: 141 mg/dL — ABNORMAL HIGH (ref 70–99)
Glucose-Capillary: 125 mg/dL — ABNORMAL HIGH (ref 70–99)
Glucose-Capillary: 125 mg/dL — ABNORMAL HIGH (ref 70–99)

## 2020-01-02 LAB — HIV ANTIBODY (ROUTINE TESTING W REFLEX): HIV Screen 4th Generation wRfx: NONREACTIVE

## 2020-01-02 LAB — BASIC METABOLIC PANEL
Anion gap: 12 (ref 5–15)
BUN: 12 mg/dL (ref 6–20)
CO2: 24 mmol/L (ref 22–32)
Calcium: 8.6 mg/dL — ABNORMAL LOW (ref 8.9–10.3)
Chloride: 104 mmol/L (ref 98–111)
Creatinine, Ser: 0.99 mg/dL (ref 0.44–1.00)
GFR calc Af Amer: >60 mL/min (ref >60)
GFR calc non Af Amer: >60 mL/min (ref >60)
Glucose, Bld: 185 mg/dL — ABNORMAL HIGH (ref 70–99)
Potassium: 4.4 mmol/L (ref 3.5–5.1)
Sodium: 140 mmol/L (ref 135–145)

## 2020-01-02 LAB — HEPARIN LEVEL (UNFRACTIONATED)
Heparin Unfractionated: 0.68 IU/mL (ref 0.30–0.70)
Heparin Unfractionated: 0.56 IU/mL (ref 0.30–0.70)

## 2020-01-02 LAB — TRIGLYCERIDES: Triglycerides: 121 mg/dL (ref <150)

## 2020-01-02 MED ORDER — FUROSEMIDE 10 MG/ML IJ SOLN
40.0000 mg | Freq: Once | INTRAMUSCULAR | Status: AC
  Administered 2020-01-02: 40 mg via INTRAVENOUS
  Filled 2020-01-02: qty 4

## 2020-01-02 MED ORDER — METHYLPREDNISOLONE SODIUM SUCC 40 MG IJ SOLR
40.0000 mg | Freq: Two times a day (BID) | INTRAMUSCULAR | Status: DC
  Administered 2020-01-02 – 2020-01-04 (×5): 40 mg via INTRAVENOUS
  Filled 2020-01-02 (×5): qty 1

## 2020-01-02 MED ORDER — BUPRENORPHINE HCL-NALOXONE HCL 8-2 MG SL SUBL
1.0000 | SUBLINGUAL_TABLET | Freq: Two times a day (BID) | SUBLINGUAL | Status: DC
  Filled 2020-01-02: qty 1

## 2020-01-02 MED ORDER — MELATONIN 5 MG PO TABS
5.0000 mg | ORAL_TABLET | Freq: Every evening | ORAL | Status: DC | PRN
  Administered 2020-01-02: 5 mg via ORAL
  Filled 2020-01-02 (×2): qty 1

## 2020-01-02 MED ORDER — BUPRENORPHINE HCL-NALOXONE HCL 2-0.5 MG SL SUBL
2.0000 | SUBLINGUAL_TABLET | Freq: Every day | SUBLINGUAL | Status: DC
  Administered 2020-01-02 – 2020-01-04 (×3): 2 via SUBLINGUAL
  Filled 2020-01-02 (×3): qty 2

## 2020-01-02 NOTE — Care Plan (Signed)
Patient is extubated to BIPAP. Tolerating well
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
## 2020-01-02 NOTE — Anesthesia Postprocedure Evaluation (Signed)
Anesthesia Post Note  Patient: Christy [place]  Procedure(s) Performed: THROMBECTOMY BRACHIAL AXILLARY ARTERY (Right ) ANGIOPLASTY ans STENT PLACEMENT RIGHT SUBCLAVIAN AND AXILLARY ARTERY (Right )  Patient location during evaluation: ICU Anesthesia Type: General Level of consciousness: sedated Pain management: pain level controlled Vital Signs Assessment: post-procedure vital signs reviewed and stable Respiratory status: patient remains intubated per anesthesia plan Cardiovascular status: stable Postop Assessment: no apparent nausea or vomiting Anesthetic complications: no   No complications documented.   Last Vitals:  Vitals:   01/02/20 0500 01/02/20 0600  BP: 96/62 103/60  Pulse: 84 81  Resp: 18 17  Temp:    SpO2: 92% 94%    Last Pain:  Vitals:   01/01/20 2000  TempSrc: Oral  PainSc:                  Christy [place]

## 2020-01-02 NOTE — Consult Note (Signed)
[place] for Heparin Indication: peripheral vascular disease    Allergies  Allergen Reactions  . Acetaminophen     Avoid due to fatty liver   . Nsaids Other (See Comments)    irritates stomach severley   . Other     Steroids-muscle weakness and headache  . Sular [Nisoldipine Er] Palpitations    Patient Measurements: Height: 5' 2.5" (158.8 cm) Weight: 81.6 kg (180 lb) IBW/kg (Calculated) : 51.25 Heparin Dosing Weight: 69.3 kg  Vital Signs: Temp: 97.6 F (36.4 C) (06/22 2000) Temp Source: Oral (06/22 2000) BP: 95/55 (06/22 2200) Pulse Rate: 74 (06/22 2200)  Labs: Recent Labs    01/01/20 1029 01/01/20 2008  HGB  --  12.8  HCT  --  37.5  PLT  --  179  APTT  --  135*  LABPROT  --  14.0  INR  --  1.1  CREATININE 0.77 0.84    Estimated Creatinine Clearance: 71.3 mL/min (by C-G formula based on SCr of 0.84 mg/dL).   Medical History: Past Medical History:  Diagnosis Date  . Allergy   . Anxiety   . Arthritis   . Asthma   . Complication of anesthesia   . COPD (chronic obstructive pulmonary disease) ([place])   . Emphysema of lung ([place])   . GERD (gastroesophageal reflux disease)   . History of hiatal hernia   . Hypertension   . Hyperthyroidism    h/o  . Neuromuscular disorder ([place])   . PONV (postoperative nausea and vomiting)   . Thyroid disease     Medications:  Medications Prior to Admission  Medication Sig Dispense Refill Last Dose  . ADVAIR DISKUS 250-50 MCG/DOSE AEPB INHALE 1 PUFF INTO THE LUNGS TWICE DAILY (Patient taking differently: Inhale 1 puff into the lungs 2 (two) times daily. ) 60 each 1 01/01/2020 at Unknown time  . albuterol (PROVENTIL) (2.5 MG/3ML) 0.083% nebulizer solution Take 3 mLs (2.5 mg total) by nebulization every 4 (four) hours as needed for wheezing or shortness of breath. 75 mL 12 Past Month at Unknown time  . Ascorbic Acid (VITAMIN C PO) Take 1 tablet by mouth daily.   12/31/2019 at Unknown time  .  aspirin EC 81 MG tablet Take 81 mg by mouth daily.   Past Week at Unknown time  . Aspirin-Caffeine (BC FAST PAIN RELIEF PO) Take 1 packet by mouth daily as needed (pain).   Past Month at Unknown time  . cetirizine (ZYRTEC) 10 MG tablet Take 10 mg by mouth daily.   01/01/2020 at Unknown time  . cholecalciferol (VITAMIN D3) 25 MCG (1000 UNIT) tablet Take 1,000 Units by mouth daily.   01/01/2020 at Unknown time  . diphenhydrAMINE HCl (ZZZQUIL) 50 MG/30ML LIQD Take 50 mg by mouth at bedtime.   12/31/2019 at Unknown time  . losartan-hydrochlorothiazide (HYZAAR) 50-12.5 MG tablet Take 1 tablet by mouth daily. 90 tablet 1 01/01/2020 at Unknown time  . Multiple Vitamin (MULTIVITAMIN WITH MINERALS) TABS tablet Take 1 tablet by mouth daily.   12/31/2019 at Unknown time  . PROAIR HFA 108 (90 Base) MCG/ACT inhaler INHALE 2 PUFFS INTO THE LUNGS EVERY 6 HOURS AS NEEDED FOR WHEEZING OR SHORTNESS OF BREATH (Patient taking differently: Inhale 2 puffs into the lungs every 6 (six) hours as needed for wheezing or shortness of breath. ) 17 g 1 01/01/2020 at Unknown time  . SPIRIVA HANDIHALER 18 MCG inhalation capsule INHALE CONTENTS OF 1 CAPSULE ONCE DAILY USING HANDIHALER (Patient taking  differently: Place 18 mcg into inhaler and inhale daily. ) 90 capsule 1 01/01/2020 at Unknown time  . SUBOXONE 8-2 MG FILM Take 1 Film by mouth in the morning and at bedtime.    12/31/2019 at Unknown time  . triamcinolone (NASACORT ALLERGY 24HR) 55 MCG/ACT AERO nasal inhaler Place 1 spray into the nose daily as needed (allergies).    12/31/2019 at Unknown time  . nicotine (NICOTROL) 10 MG inhaler Inhale 1 Cartridge (1 continuous puffing total) into the lungs as needed for smoking cessation. (Patient not taking: Reported on 12/31/2019) 42 each 0 Not Taking at Unknown time  . predniSONE (DELTASONE) 20 MG tablet Take 1 tablet (20 mg total) by mouth daily with breakfast. 7 days (Patient not taking: Reported on 12/31/2019) 7 tablet 1 Completed Course  .  sertraline (ZOLOFT) 50 MG tablet Take 1 tablet (50 mg total) by mouth daily. (Patient not taking: Reported on 12/31/2019) 90 tablet 1 Not Taking at Unknown time   Scheduled:  . budesonide (PULMICORT) nebulizer solution  0.5 mg Nebulization BID  . chlorhexidine gluconate (MEDLINE KIT)  15 mL Mouth Rinse BID  . Chlorhexidine Gluconate Cloth  6 each Topical Daily  . docusate  100 mg Oral BID  . fentaNYL (SUBLIMAZE) injection  50 mcg Intravenous Once  . hydrochlorothiazide  12.5 mg Oral Daily  . ipratropium-albuterol  3 mL Nebulization Q4H  . ketorolac  30 mg Intravenous Q6H  . losartan  50 mg Oral Daily  . mouth rinse  15 mL Mouth Rinse 10 times per day  . polyethylene glycol  17 g Oral Daily   Infusions:  . famotidine (PEPCID) IV 20 mg (01/01/20 2258)  . fentaNYL infusion INTRAVENOUS 50 mcg/hr (01/01/20 1954)  . heparin 850 Units/hr (01/01/20 2200)  . propofol (DIPRIVAN) infusion 30 mcg/kg/min (01/02/20 0002)   PRN: fentaNYL, ipratropium-albuterol, ondansetron **OR** ondansetron (ZOFRAN) IV  Assessment: Pharmacy consulted to start heparin for PVD. No initial bolus per MD.   Goal of Therapy:  Heparin level 0.3-0.7 units/ml Monitor platelets by anticoagulation protocol: Yes   Plan:  Start heparin infusion at 850 units/hr Check anti-Xa level in 6 hours and daily while on heparin Continue to monitor H&H and platelets  6/23: Called RN @ 0400 to remind her to draw HL since pt has central line.  RN states she could not draw HL since heparin is currently infusing thru central line.  Instructed RN to pause Heparin drip , flush line , then draw HL and resume drip immediately.   Maruice Pieroni D 01/02/2020,4:07 AM

## 2020-01-02 NOTE — Progress Notes (Signed)
SHIFT SUMMARY:   Pt appears agitated on initial assessment, she is coughing and reaching for her ETT. 50 mcg Fentanyl bolus given to patient. Dr. Karna Christmas assessed patient and determined she is ready for extubation. Propofol turned off around 8:45am.  Fentanyl gtt turned off prior to extubation. 130 cc of Fentanyl wasted in sink-witnessed by Dillion W, RN. Patient extubated to Bipap. She complains of a mild headache, she is A&0x4, resting comfortably and asking for tv remote.   Upon initial assessment, patients right femoral vascular site balloon PADS remains inflated. Godfrey Pick, PA for Vascular instructed that PAD could be deflated at 10 cc/ hour. Deflation began at 9am.   Pt had several bipap breaks throughout the day where she was placed on nasal canula and given sips of water. She has tolerated this well.   Patient and her sister updated by Dr. Gilda Crease at bedside.

## 2020-01-02 NOTE — Consult Note (Signed)
[place] [facility] for Heparin Indication: peripheral vascular disease    Allergies  Allergen Reactions  . Acetaminophen     Avoid due to fatty liver   . Nsaids Other (See Comments)    irritates stomach severley   . Other     Steroids-muscle weakness and headache  . Sular [Nisoldipine Er] Palpitations    Patient Measurements: Height: 5' 2.5" (158.8 cm) Weight: 81.6 kg (180 lb) IBW/kg (Calculated) : 51.25 Heparin Dosing Weight: 69.3 kg  Vital Signs: Temp: 97.6 F (36.4 C) (06/22 2000) Temp Source: Oral (06/22 2000) BP: 95/55 (06/22 2200) Pulse Rate: 74 (06/22 2200)  Labs: Recent Labs    01/01/20 1029 01/01/20 2008 01/02/20 0406  HGB  --  12.8 12.4  HCT  --  37.5 37.7  PLT  --  179 183  APTT  --  135*  --   LABPROT  --  14.0  --   INR  --  1.1  --   HEPARINUNFRC  --   --  0.68  CREATININE 0.77 0.84 0.99    Estimated Creatinine Clearance: 60.5 mL/min (by C-G formula based on SCr of 0.99 mg/dL).   Medical History: Past Medical History:  Diagnosis Date  . Allergy   . Anxiety   . Arthritis   . Asthma   . Complication of anesthesia   . COPD (chronic obstructive pulmonary disease) ([place])   . Emphysema of lung ([place])   . GERD (gastroesophageal reflux disease)   . History of hiatal hernia   . Hypertension   . Hyperthyroidism    h/o  . Neuromuscular disorder ([place])   . PONV (postoperative nausea and vomiting)   . Thyroid disease     Medications:  Medications Prior to Admission  Medication Sig Dispense Refill Last Dose  . ADVAIR DISKUS 250-50 MCG/DOSE AEPB INHALE 1 PUFF INTO THE LUNGS TWICE DAILY (Patient taking differently: Inhale 1 puff into the lungs 2 (two) times daily. ) 60 each 1 01/01/2020 at Unknown time  . albuterol (PROVENTIL) (2.5 MG/3ML) 0.083% nebulizer solution Take 3 mLs (2.5 mg total) by nebulization every 4 (four) hours as needed for wheezing or shortness of breath. 75 mL 12 Past Month at Unknown time  . Ascorbic  Acid (VITAMIN C PO) Take 1 tablet by mouth daily.   12/31/2019 at Unknown time  . aspirin EC 81 MG tablet Take 81 mg by mouth daily.   Past Week at Unknown time  . Aspirin-Caffeine (BC FAST PAIN RELIEF PO) Take 1 packet by mouth daily as needed (pain).   Past Month at Unknown time  . cetirizine (ZYRTEC) 10 MG tablet Take 10 mg by mouth daily.   01/01/2020 at Unknown time  . cholecalciferol (VITAMIN D3) 25 MCG (1000 UNIT) tablet Take 1,000 Units by mouth daily.   01/01/2020 at Unknown time  . diphenhydrAMINE HCl (ZZZQUIL) 50 MG/30ML LIQD Take 50 mg by mouth at bedtime.   12/31/2019 at Unknown time  . losartan-hydrochlorothiazide (HYZAAR) 50-12.5 MG tablet Take 1 tablet by mouth daily. 90 tablet 1 01/01/2020 at Unknown time  . Multiple Vitamin (MULTIVITAMIN WITH MINERALS) TABS tablet Take 1 tablet by mouth daily.   12/31/2019 at Unknown time  . PROAIR HFA 108 (90 Base) MCG/ACT inhaler INHALE 2 PUFFS INTO THE LUNGS EVERY 6 HOURS AS NEEDED FOR WHEEZING OR SHORTNESS OF BREATH (Patient taking differently: Inhale 2 puffs into the lungs every 6 (six) hours as needed for wheezing or shortness of breath. ) 17 g  1 01/01/2020 at Unknown time  . SPIRIVA HANDIHALER 18 MCG inhalation capsule INHALE CONTENTS OF 1 CAPSULE ONCE DAILY USING HANDIHALER (Patient taking differently: Place 18 mcg into inhaler and inhale daily. ) 90 capsule 1 01/01/2020 at Unknown time  . SUBOXONE 8-2 MG FILM Take 1 Film by mouth in the morning and at bedtime.    12/31/2019 at Unknown time  . triamcinolone (NASACORT ALLERGY 24HR) 55 MCG/ACT AERO nasal inhaler Place 1 spray into the nose daily as needed (allergies).    12/31/2019 at Unknown time  . nicotine (NICOTROL) 10 MG inhaler Inhale 1 Cartridge (1 continuous puffing total) into the lungs as needed for smoking cessation. (Patient not taking: Reported on 12/31/2019) 42 each 0 Not Taking at Unknown time  . predniSONE (DELTASONE) 20 MG tablet Take 1 tablet (20 mg total) by mouth daily with breakfast. 7  days (Patient not taking: Reported on 12/31/2019) 7 tablet 1 Completed Course  . sertraline (ZOLOFT) 50 MG tablet Take 1 tablet (50 mg total) by mouth daily. (Patient not taking: Reported on 12/31/2019) 90 tablet 1 Not Taking at Unknown time   Scheduled:  . budesonide (PULMICORT) nebulizer solution  0.5 mg Nebulization BID  . chlorhexidine gluconate (MEDLINE KIT)  15 mL Mouth Rinse BID  . Chlorhexidine Gluconate Cloth  6 each Topical Daily  . docusate  100 mg Oral BID  . fentaNYL (SUBLIMAZE) injection  50 mcg Intravenous Once  . hydrochlorothiazide  12.5 mg Oral Daily  . ipratropium-albuterol  3 mL Nebulization Q4H  . ketorolac  30 mg Intravenous Q6H  . losartan  50 mg Oral Daily  . mouth rinse  15 mL Mouth Rinse 10 times per day  . polyethylene glycol  17 g Oral Daily   Infusions:  . famotidine (PEPCID) IV 20 mg (01/01/20 2258)  . fentaNYL infusion INTRAVENOUS 50 mcg/hr (01/01/20 1954)  . heparin 850 Units/hr (01/01/20 2200)  . propofol (DIPRIVAN) infusion 30 mcg/kg/min (01/02/20 0002)   PRN: fentaNYL, ipratropium-albuterol, ondansetron **OR** ondansetron (ZOFRAN) IV  Assessment: Pharmacy consulted to start heparin for PVD. No initial bolus per MD.   Goal of Therapy:  Heparin level 0.3-0.7 units/ml Monitor platelets by anticoagulation protocol: Yes   Plan:  Start heparin infusion at 850 units/hr Check anti-Xa level in 6 hours and daily while on heparin Continue to monitor H&H and platelets  6/23: Called RN @ 0400 to remind her to draw HL since pt has central line.  RN states she could not draw HL since heparin is currently infusing thru central line.  Instructed RN to pause Heparin drip , flush line , then draw HL and resume drip immediately.   6/23: HL @ 0406 = 0.68  Will continue pt on current rate and draw confirmation level on 6/23 @ 1000.   Cross Christy Mason 01/02/2020,5:16 AM

## 2020-01-02 NOTE — Progress Notes (Signed)
[facility] Daily Progress Note  Subjective: 01/01/20:             1.  Arch aortogram             2.  Right arm angiography third order catheter placement             3.  Percutaneous transluminal angioplasty to 4 mm right brachial artery             4.  Percutaneous transluminal angioplasty right subclavian artery at the location of the previous stents             5.  Star close right common femoral  Emergently taken to the OR and underwent: 1.  Thrombectomy of the right ulnar artery with a #2 Fogarty embolectomy catheter. 2.  Thrombectomy of the right brachial artery and axillary artery with a #2 Fogarty embolectomy catheter. 3.  Open angioplasty and stent placement of the right subclavian and proximal axillary artery brachial artery approach retrograde  Patient without complaints this AM.  Successfully extubated to BiPAP.  Improved right hand pain.  No acute issues overnight.  Objective: Vitals:   01/02/20 0900 01/02/20 1000 01/02/20 1100 01/02/20 1200  BP: 110/80 110/70 (!) 85/56 (!) 104/59  Pulse: (!) 117 (!) 105 (!) 105 (!) 102  Resp: (!) 25 19 (!) 25 (!) 23  Temp:      TempSrc:      SpO2: 94% 96% 91% 95%  Weight:      Height:        Intake/Output Summary (Last 24 hours) at 01/02/2020 1256 Last data filed at 01/02/2020 1100 Gross per 24 hour  Intake 1445.94 ml  Output 525 ml  Net 920.94 ml   Physical Exam: A&Ox3, NAD, On BiPap CV: RRR Pulmonary: Bilateral wheezing, nonlabored Abdomen: Soft, Nontender, Nondistended Vascular:  Right upper extremity: Extremities warm distally to fingers.  Palpable radial and ulnar pulse.  Good capillary refill.  Motor/sensory is intact.  Incision: Clean dry and intact.   Laboratory: CBC    Component Value Date/Time   WBC 14.4 (H) 01/02/2020 0406   HGB 12.4 01/02/2020 0406   HGB 13.2 12/13/2018 1552   HCT 37.7 01/02/2020 0406   HCT 37.6 12/13/2018 1552   PLT 183 01/02/2020 0406   PLT 235 12/13/2018 1552    BMET    Component Value Date/Time   NA 140 01/02/2020 0406   NA 142 10/08/2019 1535   NA 136 03/19/2014 1818   K 4.4 01/02/2020 0406   K 3.0 (L) 03/19/2014 1818   CL 104 01/02/2020 0406   CL 101 03/19/2014 1818   CO2 24 01/02/2020 0406   CO2 24 03/19/2014 1818   GLUCOSE 185 (H) 01/02/2020 0406   GLUCOSE 139 (H) 03/19/2014 1818   BUN 12 01/02/2020 0406   BUN 7 (L) 10/08/2019 1535   BUN 10 03/19/2014 1818   CREATININE 0.99 01/02/2020 0406   CREATININE 0.86 03/19/2014 1818   CALCIUM 8.6 (L) 01/02/2020 0406   CALCIUM 10.4 (H) 03/19/2014 1818   GFRNONAA >60 01/02/2020 0406   GFRNONAA >60 03/19/2014 1818   GFRAA >60 01/02/2020 0406   GFRAA >60 03/19/2014 1818   Assessment/Planning: The patient is a 59 year old female who presented with right upper extremity ischemia status post right upper extremity angiogram with intervention, status post open thrombectomy - POD#1  1) successfully extubated this a.m., now on BiPAP 2) hemoglobin stable 3) continue heparin today 4) Encourage deep breathing, incentive spirometry,  out of bed to chair 5) PT /OT 6) Restart Suboxone home regimine  Seen and examined with Dr. Charlie Pitter [facility] PA-C 01/02/2020 12:56 PM

## 2020-01-02 NOTE — Consult Note (Signed)
ANTICOAGULATION CONSULT NOTE  Pharmacy Consult for Heparin Indication: peripheral vascular disease    Allergies  Allergen Reactions  . Acetaminophen     Avoid due to fatty liver   . Nsaids Other (See Comments)    irritates stomach severley   . Other     Steroids-muscle weakness and headache  . Sular [Nisoldipine Er] Palpitations    Patient Measurements: Height: 5' 2.5" (158.8 cm) Weight: 81.6 kg (180 lb) IBW/kg (Calculated) : 51.25 Heparin Dosing Weight: 69.3 kg  Vital Signs: Temp: 98.5 F (36.9 C) (06/23 0800) Temp Source: Axillary (06/23 0800) BP: 110/70 (06/23 1000) Pulse Rate: 105 (06/23 1000)  Labs: Recent Labs    01/01/20 1029 01/01/20 2008 01/02/20 0406 01/02/20 0949  HGB  --  12.8 12.4  --   HCT  --  37.5 37.7  --   PLT  --  179 183  --   APTT  --  135*  --   --   LABPROT  --  14.0  --   --   INR  --  1.1  --   --   HEPARINUNFRC  --   --  0.68 0.56  CREATININE 0.77 0.84 0.99  --     Estimated Creatinine Clearance: 60.5 mL/min (by C-G formula based on SCr of 0.99 mg/dL).   Medical History: Past Medical History:  Diagnosis Date  . Allergy   . Anxiety   . Arthritis   . Asthma   . Complication of anesthesia   . COPD (chronic obstructive pulmonary disease) ([facility])   . Emphysema of lung ([facility])   . GERD (gastroesophageal reflux disease)   . History of hiatal hernia   . Hypertension   . Hyperthyroidism    h/o  . Neuromuscular disorder ([facility])   . PONV (postoperative nausea and vomiting)   . Thyroid disease     Assessment: Pharmacy consulted to start heparin for PVD. Patient is now s/p vascular intervention with stent that reoccluded requiring intervention in the OR. Successfully extubated 6/23. Patient remains on heparin drip.   Goal of Therapy:  Heparin level 0.3-0.7 units/ml Monitor platelets by anticoagulation protocol: Yes   Plan:  6/23 0949 HL 0.56 therapeutic x 2. Continue heparin drip at 850 units/hr. HL and CBC with morning labs. Will  follow recommendations from Vascular for heparin plan.  Pricilla Riffle, PharmD 01/02/2020,11:18 AM

## 2020-01-03 ENCOUNTER — Encounter: Payer: Self-pay | Admitting: Vascular Surgery

## 2020-01-03 DIAGNOSIS — J9600 Acute respiratory failure, unspecified whether with hypoxia or hypercapnia: Secondary | ICD-10-CM

## 2020-01-03 DIAGNOSIS — Z4659 Encounter for fitting and adjustment of other gastrointestinal appliance and device: Secondary | ICD-10-CM

## 2020-01-03 DIAGNOSIS — I708 Atherosclerosis of other arteries: Secondary | ICD-10-CM

## 2020-01-03 DIAGNOSIS — R9389 Abnormal findings on diagnostic imaging of other specified body structures: Secondary | ICD-10-CM

## 2020-01-03 LAB — BASIC METABOLIC PANEL
Anion gap: 5 (ref 5–15)
BUN: 18 mg/dL (ref 6–20)
CO2: 29 mmol/L (ref 22–32)
Calcium: 8.2 mg/dL — ABNORMAL LOW (ref 8.9–10.3)
Chloride: 103 mmol/L (ref 98–111)
Creatinine, Ser: 0.77 mg/dL (ref 0.44–1.00)
GFR calc Af Amer: >60 mL/min (ref >60)
GFR calc non Af Amer: >60 mL/min (ref >60)
Glucose, Bld: 133 mg/dL — ABNORMAL HIGH (ref 70–99)
Potassium: 3.9 mmol/L (ref 3.5–5.1)
Sodium: 137 mmol/L (ref 135–145)

## 2020-01-03 LAB — CBC
HCT: 32.4 % — ABNORMAL LOW (ref 36.0–46.0)
Hemoglobin: 10.7 g/dL — ABNORMAL LOW (ref 12.0–15.0)
MCH: 30 pg (ref 26.0–34.0)
MCHC: 33 g/dL (ref 30.0–36.0)
MCV: 90.8 fL (ref 80.0–100.0)
Platelets: 184 10*3/uL (ref 150–400)
RBC: 3.57 MIL/uL — ABNORMAL LOW (ref 3.87–5.11)
RDW: 14.9 % (ref 11.5–15.5)
WBC: 22.5 10*3/uL — ABNORMAL HIGH (ref 4.0–10.5)
nRBC: 0 % (ref 0.0–0.2)

## 2020-01-03 LAB — TRIGLYCERIDES: Triglycerides: 124 mg/dL (ref <150)

## 2020-01-03 LAB — SURGICAL PATHOLOGY

## 2020-01-03 LAB — HEPARIN LEVEL (UNFRACTIONATED): Heparin Unfractionated: 0.42 IU/mL (ref 0.30–0.70)

## 2020-01-03 MED ORDER — IPRATROPIUM-ALBUTEROL 0.5-2.5 (3) MG/3ML IN SOLN
3.0000 mL | Freq: Three times a day (TID) | RESPIRATORY_TRACT | Status: DC
  Administered 2020-01-03 – 2020-01-04 (×4): 3 mL via RESPIRATORY_TRACT
  Filled 2020-01-03 (×4): qty 3

## 2020-01-03 MED ORDER — NICOTINE 21 MG/24HR TD PT24
21.0000 mg | MEDICATED_PATCH | Freq: Every day | TRANSDERMAL | Status: DC
  Administered 2020-01-03 – 2020-01-04 (×2): 21 mg via TRANSDERMAL
  Filled 2020-01-03 (×3): qty 1

## 2020-01-03 MED ORDER — CLOPIDOGREL BISULFATE 75 MG PO TABS
75.0000 mg | ORAL_TABLET | Freq: Every day | ORAL | Status: DC
  Administered 2020-01-04: 10:00:00 75 mg via ORAL
  Filled 2020-01-03: qty 1

## 2020-01-03 MED ORDER — OXYCODONE HCL 5 MG PO TABS
5.0000 mg | ORAL_TABLET | ORAL | Status: DC | PRN
  Administered 2020-01-03 – 2020-01-04 (×3): 5 mg via ORAL
  Filled 2020-01-03 (×3): qty 1

## 2020-01-03 MED ORDER — CLOPIDOGREL BISULFATE 75 MG PO TABS
300.0000 mg | ORAL_TABLET | Freq: Once | ORAL | Status: AC
  Administered 2020-01-03: 300 mg via ORAL
  Filled 2020-01-03: qty 4

## 2020-01-03 MED ORDER — ASPIRIN EC 81 MG PO TBEC
81.0000 mg | DELAYED_RELEASE_TABLET | Freq: Every day | ORAL | Status: DC
  Administered 2020-01-03 – 2020-01-04 (×2): 81 mg via ORAL
  Filled 2020-01-03 (×2): qty 1

## 2020-01-03 MED ORDER — HYDROCODONE-ACETAMINOPHEN 5-325 MG PO TABS
1.0000 | ORAL_TABLET | Freq: Once | ORAL | Status: AC
  Administered 2020-01-03: 1 via ORAL
  Filled 2020-01-03: qty 1
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
## 2020-01-03 NOTE — Consult Note (Signed)
ANTICOAGULATION CONSULT NOTE  Pharmacy Consult for Heparin Indication: peripheral vascular disease    Allergies  Allergen Reactions  . Acetaminophen     Avoid due to fatty liver   . Nsaids Other (See Comments)    irritates stomach severley   . Other     Steroids-muscle weakness and headache  . Sular [Nisoldipine Er] Palpitations    Patient Measurements: Height: 5' 2.5" (158.8 cm) Weight: 81.6 kg (180 lb) IBW/kg (Calculated) : 51.25 Heparin Dosing Weight: 69.3 kg  Vital Signs: Temp: 98.1 F (36.7 C) (06/23 2000) Temp Source: Oral (06/23 2000) BP: 110/69 (06/23 2000)  Labs: Recent Labs    01/01/20 2008 01/01/20 2008 01/02/20 0406 01/02/20 0949 01/03/20 0431  HGB 12.8   < > 12.4  --  10.7*  HCT 37.5  --  37.7  --  32.4*  PLT 179  --  183  --  184  APTT 135*  --   --   --   --   LABPROT 14.0  --   --   --   --   INR 1.1  --   --   --   --   HEPARINUNFRC  --   --  0.68 0.56 0.42  CREATININE 0.84  --  0.99  --  0.77   < > = values in this interval not displayed.    Estimated Creatinine Clearance: 74.8 mL/min (by C-G formula based on SCr of 0.77 mg/dL).   Medical History: Past Medical History:  Diagnosis Date  . Allergy   . Anxiety   . Arthritis   . Asthma   . Complication of anesthesia   . COPD (chronic obstructive pulmonary disease) ([facility])   . Emphysema of lung ([facility])   . GERD (gastroesophageal reflux disease)   . History of hiatal hernia   . Hypertension   . Hyperthyroidism    h/o  . Neuromuscular disorder ([facility])   . PONV (postoperative nausea and vomiting)   . Thyroid disease     Assessment: Pharmacy consulted to start heparin for PVD. Patient is now s/p vascular intervention with stent that reoccluded requiring intervention in the OR. Successfully extubated 6/23. Patient remains on heparin drip.   Goal of Therapy:  Heparin level 0.3-0.7 units/ml Monitor platelets by anticoagulation protocol: Yes   Plan:  6/23 0949 HL 0.56 therapeutic x 2.  Continue heparin drip at 850 units/hr. HL and CBC with morning labs. Will follow recommendations from Vascular for heparin plan.  6/24:  HL @ 0431 = 0.42 Will recheck HL on 6/25 with AM labs.  Shirlie Enck D, PharmD 01/03/2020,5:36 AM

## 2020-01-03 NOTE — Progress Notes (Signed)
[facility] Daily Progress Note   Subjective: 01/01/20: 1. Arch aortogram 2. Right arm angiography third order catheter placement 3. Percutaneous transluminal angioplasty to 4 mm right brachial artery 4. Percutaneous transluminal angioplasty right subclavian artery at the location of the previous stents 5. Star close right common femoral  Emergently taken to the OR and underwent: 1.Thrombectomy of the right ulnar artery with a #2 Fogarty embolectomy catheter. 2. Thrombectomy of the right brachial artery and axillary artery with a #2 Fogarty embolectomy catheter. 3. Open angioplasty and stent placement of the right subclavian and proximal axillary artery brachial artery approach retrograde  Patient without complaint this AM. No issues overnight. Asking for nicotine patch.  Objective: Vitals:   01/02/20 1300 01/02/20 1400 01/02/20 2000 01/03/20 0815  BP: 100/62 (!) 103/56 110/69 (!) 99/59  Pulse: 99 98  93  Resp: (!) 21 17  18   Temp:   98.1 F (36.7 C) 98.2 F (36.8 C)  TempSrc:   Oral Oral  SpO2: 93% 95%  97%  Weight:      Height:        Intake/Output Summary (Last 24 hours) at 01/03/2020 1028 Last data filed at 01/03/2020 0940 Gross per 24 hour  Intake 372.17 ml  Output 1950 ml  Net -1577.83 ml   Physical Exam: A&Ox3, NAD, On [place] CV: RRR Pulmonary: Bilateral wheezing, nonlabored Abdomen: Soft, Nontender, Nondistended Vascular:             Right upper extremity: Extremities warm distally to fingers.  Palpable radial and ulnar pulse.  Good capillary refill.  Motor/sensory is intact.  Incision: Clean dry and intact. Bruising noted around incision site.   Laboratory: CBC    Component Value Date/Time   WBC 22.5 (H) 01/03/2020 0431   HGB 10.7 (L) 01/03/2020 0431   HGB 13.2 12/13/2018 1552   HCT 32.4 (L) 01/03/2020 0431   HCT 37.6 12/13/2018 1552   PLT 184 01/03/2020 0431   PLT  235 12/13/2018 1552   BMET    Component Value Date/Time   NA 137 01/03/2020 0431   NA 142 10/08/2019 1535   NA 136 03/19/2014 1818   K 3.9 01/03/2020 0431   K 3.0 (L) 03/19/2014 1818   CL 103 01/03/2020 0431   CL 101 03/19/2014 1818   CO2 29 01/03/2020 0431   CO2 24 03/19/2014 1818   GLUCOSE 133 (H) 01/03/2020 0431   GLUCOSE 139 (H) 03/19/2014 1818   BUN 18 01/03/2020 0431   BUN 7 (L) 10/08/2019 1535   BUN 10 03/19/2014 1818   CREATININE 0.77 01/03/2020 0431   CREATININE 0.86 03/19/2014 1818   CALCIUM 8.2 (L) 01/03/2020 0431   CALCIUM 10.4 (H) 03/19/2014 1818   GFRNONAA >60 01/03/2020 0431   GFRNONAA >60 03/19/2014 1818   GFRAA >60 01/03/2020 0431   GFRAA >60 03/19/2014 1818   Assessment/Planning: The patient is a 59 year old female who presented with right upper extremity ischemia status post right upper extremity angiogram with intervention, status post open thrombectomy - POD#1  1) On nasal cannula 2) hemoglobin stable 3) Stop heparin. Start Plavix and ASA 4) Encourage deep breathing, incentive spirometry, out of bed to chair 5) PT / OT / Ambulation 6) Transfer out of ICU  Discussed with Dr. 67 [facility] PA-C 01/03/2020 10:28 AM

## 2020-01-03 NOTE — Plan of Care (Signed)
Per care plan 

## 2020-01-03 NOTE — Progress Notes (Signed)
Bipap on sb. Patient has refused. States she cannot breath with mask on. She states she is fine with just oxygen

## 2020-01-03 NOTE — Progress Notes (Signed)
PT Cancellation Note  Patient Details Name: Christy Mason MRN: [id number] DOB: April 19, 1961   Cancelled Treatment:    Reason Eval/Treat Not Completed: Patient at procedure or test/unavailable (Patient consult received and reviewed. Patient in process of transferring from ICU to the floor and not available for evaluation at this time. Will attempt again at later time as appropriate/available.)  Precious Bard, PT, DPT   01/03/2020, 4:13 PM
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
## 2020-01-04 ENCOUNTER — Encounter: Payer: Self-pay | Admitting: Vascular Surgery

## 2020-01-04 ENCOUNTER — Telehealth: Payer: Self-pay

## 2020-01-04 ENCOUNTER — Other Ambulatory Visit: Payer: Self-pay | Admitting: Physician Assistant

## 2020-01-04 LAB — BASIC METABOLIC PANEL
Anion gap: 9 (ref 5–15)
BUN: 25 mg/dL — ABNORMAL HIGH (ref 6–20)
CO2: 27 mmol/L (ref 22–32)
Calcium: 8.9 mg/dL (ref 8.9–10.3)
Chloride: 105 mmol/L (ref 98–111)
Creatinine, Ser: 0.79 mg/dL (ref 0.44–1.00)
GFR calc Af Amer: >60 mL/min (ref >60)
GFR calc non Af Amer: >60 mL/min (ref >60)
Glucose, Bld: 137 mg/dL — ABNORMAL HIGH (ref 70–99)
Potassium: 4.4 mmol/L (ref 3.5–5.1)
Sodium: 141 mmol/L (ref 135–145)

## 2020-01-04 LAB — CBC
HCT: 32.9 % — ABNORMAL LOW (ref 36.0–46.0)
Hemoglobin: 10.9 g/dL — ABNORMAL LOW (ref 12.0–15.0)
MCH: 30 pg (ref 26.0–34.0)
MCHC: 33.1 g/dL (ref 30.0–36.0)
MCV: 90.6 fL (ref 80.0–100.0)
Platelets: 178 10*3/uL (ref 150–400)
RBC: 3.63 MIL/uL — ABNORMAL LOW (ref 3.87–5.11)
RDW: 14.8 % (ref 11.5–15.5)
WBC: 17.6 10*3/uL — ABNORMAL HIGH (ref 4.0–10.5)
nRBC: 0 % (ref 0.0–0.2)

## 2020-01-04 LAB — MAGNESIUM: Magnesium: 2.1 mg/dL (ref 1.7–2.4)

## 2020-01-04 MED ORDER — OXYCODONE HCL 5 MG PO TABS: 5.0000 mg | ORAL_TABLET | Freq: Four times a day (QID) | ORAL | 0 refills | Status: DC | PRN

## 2020-01-04 MED ORDER — CLOPIDOGREL BISULFATE 75 MG PO TABS: 75.0000 mg | ORAL_TABLET | Freq: Every day | ORAL | 3 refills | Status: DC
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
## 2020-01-04 NOTE — Telephone Encounter (Signed)
ONo reviewed by Dr. Belia Heman- no oxygen is needed per ONO.  Pt is currently admitted, however I will hold off no calling pt until she is discharged.
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
## 2020-01-04 NOTE — Progress Notes (Signed)
Pt walked 50 feet without oxygen, sats were consistantly 92-93%. Some shortness of breath noted with exertion but respiratory rate back to normal at 18/min after patient sat down for a couple of minutes. RN made aware. Patient remained off oxygen.

## 2020-01-07 NOTE — Telephone Encounter (Signed)
Pt was discharged 01/04/2020.  Lm to relay results.

## 2020-01-08 NOTE — Telephone Encounter (Signed)
Lm x2 for pt.  

## 2020-01-09 NOTE — Telephone Encounter (Signed)
Lm x3 for pt.  Letter has been mailed to address on file.   

## 2020-01-10 ENCOUNTER — Telehealth (INDEPENDENT_AMBULATORY_CARE_PROVIDER_SITE_OTHER): Payer: Self-pay | Admitting: Vascular Surgery

## 2020-01-10 ENCOUNTER — Telehealth: Payer: Self-pay | Admitting: Physician Assistant

## 2020-01-10 DIAGNOSIS — Z720 Tobacco use: Secondary | ICD-10-CM

## 2020-01-10 NOTE — Telephone Encounter (Signed)
Pt is aware results and voiced her understanding.  Pt stated that she was recently admitted and wore oxygen while admitted but she was not discharged with oxygen. Nothing further is needed.

## 2020-01-10 NOTE — Telephone Encounter (Signed)
Called to make her F/U appt from being discharged from the hospital. She also wanted to see if it was possible to get a refill on the 5mg  of Oxycodone prescribed to her (she took the last one last night). She was last seen 12-28-19 with an RUE Arterial Duplex (FB), and had surgery 01-01-20 UE Angio(GS). Please advise.

## 2020-01-10 NOTE — Telephone Encounter (Signed)
Pt stated she just got out the hospital and was told she has to stop smoking and she has only one nicotine patch left and wants to know if Dr Vernell Morgans can write a prescription for some nicotine patches.please advise  [facility] [phone] Fax# [phone]

## 2020-01-10 NOTE — Telephone Encounter (Signed)
I spoke with Dr Gilda Crease and he recommended for the patient to contact who handles her pain management for requesting refill. Patient has been made aware with medical advice and verbalized understanding.

## 2020-01-11 MED ORDER — NICOTINE 21 MG/24HR TD PT24: 21.0000 mg | MEDICATED_PATCH | Freq: Every day | TRANSDERMAL | 0 refills | Status: DC

## 2020-01-11 NOTE — Telephone Encounter (Signed)
Sent!

## 2020-01-11 NOTE — Telephone Encounter (Signed)
What's the dose? 21, 14, or 7?

## 2020-01-11 NOTE — Telephone Encounter (Signed)
Pt returned the office call. Pt says that she would like to have the 21MG  sent in to the pharmacy.

## 2020-01-11 NOTE — Telephone Encounter (Signed)
Left message to call back. OK for PEC to ask question below.

## 2020-01-11 NOTE — Addendum Note (Signed)
Addended by: [name] on: 01/11/2020 04:28 PM   Modules accepted: Orders

## 2020-01-11 NOTE — Telephone Encounter (Signed)
Please review. Thanks!  

## 2020-01-11 NOTE — Telephone Encounter (Signed)
Called patient and left a detail message on voicemail per DRP advising her that medication was send into pharmacy.

## 2020-01-15 ENCOUNTER — Ambulatory Visit (INDEPENDENT_AMBULATORY_CARE_PROVIDER_SITE_OTHER): Payer: Medicaid Other | Admitting: Physician Assistant

## 2020-01-15 ENCOUNTER — Other Ambulatory Visit: Payer: Self-pay

## 2020-01-15 ENCOUNTER — Encounter: Payer: Self-pay | Admitting: Physician Assistant

## 2020-01-15 VITALS — BP 122/72 | HR 74 | Temp 97.8°F | Wt 186.0 lb

## 2020-01-15 DIAGNOSIS — T8149XA Infection following a procedure, other surgical site, initial encounter: Secondary | ICD-10-CM

## 2020-01-15 DIAGNOSIS — R000 Tachycardia, unspecified: Secondary | ICD-10-CM

## 2020-01-15 MED ORDER — DOXYCYCLINE HYCLATE 100 MG PO TABS: 100.0000 mg | ORAL_TABLET | Freq: Two times a day (BID) | ORAL | 0 refills | Status: DC

## 2020-01-15 NOTE — Patient Instructions (Signed)
Wound Infection °A wound infection happens when tiny organisms (microorganisms) start to grow in a wound. A wound infection is most often caused by bacteria. Infection can cause the wound to break open or worsen. Wound infection needs treatment. If a wound infection is left untreated, complications can occur. Untreated wound infections may lead to an infection in the bloodstream (septicemia) or a bone infection (osteomyelitis). °What are the causes? °This condition is most often caused by bacteria growing in a wound. Other microorganisms, like yeast and fungi, can also cause wound infections. °What increases the risk? °The following factors may make you more likely to develop this condition: °· Having a weak body defense system (immune system). °· Having diabetes. °· Taking steroid medicines for a long time (chronic use). °· Smoking. °· Being an older person. °· Being overweight. °· Taking chemotherapy medicines. °What are the signs or symptoms? °Symptoms of this condition include: °· Having more redness, swelling, or pain at the wound site. °· Having more blood or fluid at the wound site. °· A bad smell coming from a wound or bandage (dressing). °· Having a fever. °· Feeling tired or fatigued. °· Having warmth at or around the wound. °· Having pus at the wound site. °How is this diagnosed? °This condition is diagnosed with a medical history and physical exam. You may also have a wound culture or blood tests or both. °How is this treated? °This condition is usually treated with an antibiotic medicine. °· The infection should improve 24-48 hours after you start antibiotics. °· After 24-48 hours, redness around the wound should stop spreading, and the wound should be less painful. °Follow these instructions at home: °Medicines °· Take or apply over-the-counter and prescription medicines only as told by your health care provider. °· If you were prescribed an antibiotic medicine, take or apply it as told by your health  care provider. Do not stop using the antibiotic even if you start to feel better. °Wound care ° °· Clean the wound each day, or as told by your health care provider. °? Wash the wound with mild soap and water. °? Rinse the wound with water to remove all soap. °? Pat the wound dry with a clean towel. Do not rub it. °· Follow instructions from your health care provider about how to take care of your wound. Make sure you: °? Wash your hands with soap and water before and after you change your dressing. If soap and water are not available, use hand sanitizer. °? Change your dressing as told by your health care provider. °? Leave stitches (sutures), skin glue, or adhesive strips in place if your wound has been closed. These skin closures may need to stay in place for 2 weeks or longer. If adhesive strip edges start to loosen and curl up, you may trim the loose edges. Do not remove adhesive strips completely unless your health care provider tells you to do that. Some wounds are left open to heal on their own. °· Check your wound every day for signs of infection. Watch for: °? More redness, swelling, or pain. °? More fluid or blood. °? Warmth. °? Pus or a bad smell. °General instructions °· Keep the dressing dry until your health care provider says it can be removed. °· Do not take baths, swim, or use a hot tub until your health care provider approves. Ask your health care provider if you may take showers. You may only be allowed to take sponge baths. °· Raise (elevate)   the injured area above the level of your heart while you are sitting or lying down. °· Do not scratch or pick at the wound. °· Keep all follow-up visits as told by your health care provider. This is important. °Contact a health care provider if: °· Your pain is not controlled with medicine. °· You have more redness, swelling, or pain around your wound. °· You have more fluid or blood coming from your wound. °· Your wound feels warm to the touch. °· You have  pus coming from your wound. °· You continue to notice a bad smell coming from your wound or your dressing. °· Your wound that was closed breaks open. °Get help right away if: °· You have a red streak going away from your wound. °· You have a fever. °Summary °· A wound infection happens when tiny organisms (microorganisms) start to grow in a wound. °· This condition is usually treated with an antibiotic medicine. °· Follow instructions from your health care provider about how to take care of your wound. °· Contact a health care provider if your wound infection does not begin to improve in 24-48 hours, or your symptoms worsen. °· Keep all follow-up visits as told by your health care provider. This is important. °This information is not intended to replace advice given to you by your health care provider. Make sure you discuss any questions you have with your health care provider. °Document Revised: 02/07/2018 Document Reviewed: 02/07/2018 °Elsevier Patient Education © 2020 [employer] ° °

## 2020-01-15 NOTE — Progress Notes (Signed)
Established patient visit   Patient: Christy Mason   DOB: 03-24-1961   59 y.o. Female  MRN: [id number] Visit Date: 01/15/2020  Today's healthcare provider: [name], PA-C   Chief Complaint  Patient presents with  . Hospitalization Follow-up  I,Amylah Will M Izola Teague,acting as a scribe for [name], PA-C.,have documented all relevant documentation on the behalf of [name], PA-C,as directed by  [name], PA-C while in the presence of [name], PA-C.  Subjective    HPI  Follow up Hospitalization  Patient was admitted to [facility] on 01/01/2020 and discharged on 01/04/2020. She was treated for subclavian arterial stenosis. Treatment for this included labs. Telephone follow up was done on none. She reports satisfactory compliance with treatment. She reports this condition is resolved.  ----------------------------------------------------------------------------------------- - Patient presents today wanting to discuss a cardiology referral. She was seen by vascular and had surgery (ANGIOPLASTY and STENT PLACEMENT RIGHT SUBCLAVIAN AND AXILLARY ARTERY) on 01/01/2020. However, during the recovery period she had an ischemic right hand and was taken immediately back to the OR. Dr. Gilda Crease, her vascular surgeon, ultimately performed a right brachial arteriotomy to retrieve the clot. The recovery period was uneventful and patient had resolution of her right hand pain. The incision was closed with glue. She questions if the wound is healing appropriately. She has an upcoming appointment with vascular surgery on 01/30/2020.   Patient reports she is quitting smoking and uses nicotine patches. She is down to two cigarettes per day.  Patient also notes that she has episodes of tachycardia that are intermittent. Reports that hear heart rate can get up into the 130s, especially when she is active. This has happened on several occasions in the past. Sometimes she will be  sitting there and feel an abnormal heart beat. Denies SOB, dizziness, syncope.       Medications: Outpatient Medications Prior to Visit  Medication Sig  . ADVAIR DISKUS 250-50 MCG/DOSE AEPB INHALE 1 PUFF INTO THE LUNGS TWICE DAILY  . albuterol (PROVENTIL) (2.5 MG/3ML) 0.083% nebulizer solution Take 3 mLs (2.5 mg total) by nebulization every 4 (four) hours as needed for wheezing or shortness of breath.  . Ascorbic Acid (VITAMIN C PO) Take 1 tablet by mouth daily.  Marland Kitchen aspirin EC 81 MG tablet Take 81 mg by mouth daily.  . Aspirin-Caffeine (BC FAST PAIN RELIEF PO) Take 1 packet by mouth daily as needed (pain).  . cetirizine (ZYRTEC) 10 MG tablet Take 10 mg by mouth daily.  . cholecalciferol (VITAMIN D3) 25 MCG (1000 UNIT) tablet Take 1,000 Units by mouth daily.  . clopidogrel (PLAVIX) 75 MG tablet Take 1 tablet (75 mg total) by mouth daily.  . diphenhydrAMINE HCl (ZZZQUIL) 50 MG/30ML LIQD Take 50 mg by mouth at bedtime.  Marland Kitchen losartan-hydrochlorothiazide (HYZAAR) 50-12.5 MG tablet Take 1 tablet by mouth daily.  . Multiple Vitamin (MULTIVITAMIN WITH MINERALS) TABS tablet Take 1 tablet by mouth daily.  . nicotine (NICODERM CQ) 21 mg/24hr patch Place 1 patch (21 mg total) onto the skin daily.  Marland Kitchen oxyCODONE (OXY IR/ROXICODONE) 5 MG immediate release tablet Take 1 tablet (5 mg total) by mouth every 6 (six) hours as needed for severe pain (headache).  . predniSONE (DELTASONE) 20 MG tablet Take 1 tablet (20 mg total) by mouth daily with breakfast. 7 days  . PROAIR HFA 108 (90 Base) MCG/ACT inhaler INHALE 2 PUFFS INTO THE LUNGS EVERY 6 HOURS AS NEEDED FOR WHEEZING OR SHORTNESS OF BREATH (  Patient taking differently: Inhale 2 puffs into the lungs every 6 (six) hours as needed for wheezing or shortness of breath. )  . sertraline (ZOLOFT) 50 MG tablet Take 1 tablet (50 mg total) by mouth daily.  Marland Kitchen SPIRIVA HANDIHALER 18 MCG inhalation capsule INHALE CONTENTS OF 1 CAPSULE ONCE DAILY USING HANDIHALER (Patient taking  differently: Place 18 mcg into inhaler and inhale daily. )  . SUBOXONE 8-2 MG FILM Take 1 Film by mouth in the morning and at bedtime.   . triamcinolone (NASACORT ALLERGY 24HR) 55 MCG/ACT AERO nasal inhaler Place 1 spray into the nose daily as needed (allergies).    No facility-administered medications prior to visit.    Review of Systems  Constitutional: Negative.   Respiratory: Negative.   Cardiovascular: Negative.   Hematological: Negative.       Objective    BP 122/72 (BP Location: Left Arm, Patient Position: Sitting, Cuff Size: Normal)   Pulse 74   Temp 97.8 F (36.6 C) (Oral)   Wt 186 lb (84.4 kg)   SpO2 92%   BMI 33.48 kg/m    Physical Exam Constitutional:      Appearance: Normal appearance.  Cardiovascular:     Rate and Rhythm: Normal rate and regular rhythm.     Pulses: Normal pulses.     Heart sounds: Normal heart sounds.  Pulmonary:     Effort: Pulmonary effort is normal.     Breath sounds: Normal breath sounds.  Skin:    General: Skin is warm and dry.  Neurological:     General: No focal deficit present.     Mental Status: She is alert and oriented to person, place, and time.  Psychiatric:        Mood and Affect: Mood normal.        Behavior: Behavior normal.    Media Information   Document Information  Photos  Right arm wound   01/15/2020 15:08  Attached To:  Office Visit on 01/15/20 with [name], PA-C  Source Information  [name], PA-C  [facility]      No results found for any visits on 01/15/20.  Assessment & Plan    1. Tachycardia   Potential atrial fibrillation, refer to cardiology for further workup of intermittent tachycardia.   - Ambulatory referral to Cardiology  2. Wound infection after surgery Patient presented in the office today, 01/15/2020 for hospitalization follow-up and incision from brachial arteriotomy has dehisced and becoe infected. Patient was prescribed doxycycline 100 MG  Twice  daily for 7 days as below. Contacted Dr. Gilda Crease through secure chat and he confirmed his clinic would see the patient this week.   - doxycycline (VIBRA-TABS) 100 MG tablet; Take 1 tablet (100 mg total) by mouth 2 (two) times daily.  Dispense: 14 tablet; Refill: 0   Return if symptoms worsen or fail to improve.      ITrey Sailors, PA-C, have reviewed all documentation for this visit. The documentation on 01/16/20 for the exam, diagnosis, procedures, and orders are all accurate and complete.    Maryella Shivers  [facility] [phone] (phone) [phone] (fax)  [facility]

## 2020-01-21 ENCOUNTER — Ambulatory Visit (INDEPENDENT_AMBULATORY_CARE_PROVIDER_SITE_OTHER): Payer: Medicaid Other | Admitting: Vascular Surgery

## 2020-01-21 ENCOUNTER — Encounter (INDEPENDENT_AMBULATORY_CARE_PROVIDER_SITE_OTHER): Payer: Self-pay | Admitting: Vascular Surgery

## 2020-01-21 ENCOUNTER — Other Ambulatory Visit: Payer: Self-pay

## 2020-01-21 ENCOUNTER — Telehealth: Payer: Self-pay | Admitting: Physician Assistant

## 2020-01-21 DIAGNOSIS — T8130XA Disruption of wound, unspecified, initial encounter: Secondary | ICD-10-CM

## 2020-01-21 MED ORDER — OXYCODONE HCL 5 MG PO TABS: 5.0000 mg | ORAL_TABLET | Freq: Four times a day (QID) | ORAL | 0 refills | Status: DC | PRN

## 2020-01-21 MED ORDER — SILVER SULFADIAZINE 1 % EX CREA
1.0000 | TOPICAL_CREAM | Freq: Every day | CUTANEOUS | 2 refills | Status: DC
Start: 2020-01-21 — End: 2020-11-24

## 2020-01-21 NOTE — Telephone Encounter (Signed)
Signed CPAP study orders and placed for fax.

## 2020-01-21 NOTE — Progress Notes (Signed)
Patient ID: Christy Mason, female   DOB: [birth year], 58 y.o.   MRN: [id number]  Chief Complaint  Patient presents with  . Follow-up    add on per GS    HPI Christy Mason is a 59 y.o. female.  The patient is sent back to the office for evaluation of her wound.  She notes that the skin has separated.  She denies drainage.  There are no fever chills.  She does feel that it is very painful.  She has been treating it with Epson salt soaks and Hibiclens washes.   Past Medical History:  Diagnosis Date  . Allergy   . Anxiety   . Arthritis   . Asthma   . Complication of anesthesia   . COPD (chronic obstructive pulmonary disease) (HCC)   . Emphysema of lung (HCC)   . GERD (gastroesophageal reflux disease)   . History of hiatal hernia   . Hypertension   . Hyperthyroidism    h/o  . Neuromuscular disorder (HCC)   . PONV (postoperative nausea and vomiting)   . Thyroid disease     Past Surgical History:  Procedure Laterality Date  . ANGIOPLASTY Right 01/01/2020   Procedure: ANGIOPLASTY ans STENT PLACEMENT RIGHT SUBCLAVIAN AND AXILLARY ARTERY;  Surgeon: Renford Dills, MD;  Location: [facility] ORS;  Service: Vascular;  Laterality: Right;  . CARPAL TUNNEL RELEASE Right   . CHOLECYSTECTOMY    . FRACTURE SURGERY Right    plates  . FUNCTIONAL ENDOSCOPIC SINUS SURGERY    . SALPINGOOPHORECTOMY Right   . THROMBECTOMY BRACHIAL ARTERY Right 01/01/2020   Procedure: THROMBECTOMY BRACHIAL AXILLARY ARTERY;  Surgeon: Renford Dills, MD;  Location: [facility] ORS;  Service: Vascular;  Laterality: Right;  . TUBAL LIGATION    . UPPER EXTREMITY ANGIOGRAPHY Right 01/23/2019   Procedure: UPPER EXTREMITY ANGIOGRAPHY;  Surgeon: Renford Dills, MD;  Location: [facility] IN[facility];  Service: Cardiovascular;  Laterality: Right;  . UPPER EXTREMITY ANGIOGRAPHY Right 02/07/2019   Procedure: UPPER EXTREMITY ANGIOGRAPHY;  Surgeon: Renford Dills, MD;  Location: [facility];  Service: Cardiovascular;   Laterality: Right;  . UPPER EXTREMITY ANGIOGRAPHY Right 01/01/2020   Procedure: UPPER EXTREMITY ANGIOGRAPHY;  Surgeon: Renford Dills, MD;  Location: [facility];  Service: Cardiovascular;  Laterality: Right;      Allergies  Allergen Reactions  . Acetaminophen     Avoid due to fatty liver   . Nsaids Other (See Comments)    irritates stomach severley   . Other     Steroids-muscle weakness and headache  . Sular [Nisoldipine Er] Palpitations    Current Outpatient Medications  Medication Sig Dispense Refill  . ADVAIR DISKUS 250-50 MCG/DOSE AEPB INHALE 1 PUFF INTO THE LUNGS TWICE DAILY 60 each 1  . albuterol (PROVENTIL) (2.5 MG/3ML) 0.083% nebulizer solution Take 3 mLs (2.5 mg total) by nebulization every 4 (four) hours as needed for wheezing or shortness of breath. 75 mL 12  . Ascorbic Acid (VITAMIN C PO) Take 1 tablet by mouth daily.    Marland Kitchen aspirin EC 81 MG tablet Take 81 mg by mouth daily.    . Aspirin-Caffeine (BC FAST PAIN RELIEF PO) Take 1 packet by mouth daily as needed (pain).    . cetirizine (ZYRTEC) 10 MG tablet Take 10 mg by mouth daily.    . cholecalciferol (VITAMIN D3) 25 MCG (1000 UNIT) tablet Take 1,000 Units by mouth daily.    . clopidogrel (PLAVIX) 75 MG tablet  Take 1 tablet (75 mg total) by mouth daily. 90 tablet 3  . diphenhydrAMINE HCl (ZZZQUIL) 50 MG/30ML LIQD Take 50 mg by mouth at bedtime.    Marland Kitchen doxycycline (VIBRA-TABS) 100 MG tablet Take 1 tablet (100 mg total) by mouth 2 (two) times daily. 14 tablet 0  . losartan-hydrochlorothiazide (HYZAAR) 50-12.5 MG tablet Take 1 tablet by mouth daily. 90 tablet 1  . Multiple Vitamin (MULTIVITAMIN WITH MINERALS) TABS tablet Take 1 tablet by mouth daily.    . nicotine (NICODERM CQ) 21 mg/24hr patch Place 1 patch (21 mg total) onto the skin daily. 28 patch 0  . PROAIR HFA 108 (90 Base) MCG/ACT inhaler INHALE 2 PUFFS INTO THE LUNGS EVERY 6 HOURS AS NEEDED FOR WHEEZING OR SHORTNESS OF BREATH (Patient taking differently:  Inhale 2 puffs into the lungs every 6 (six) hours as needed for wheezing or shortness of breath. ) 17 g 1  . SPIRIVA HANDIHALER 18 MCG inhalation capsule INHALE CONTENTS OF 1 CAPSULE ONCE DAILY USING HANDIHALER (Patient taking differently: Place 18 mcg into inhaler and inhale daily. ) 90 capsule 1  . SUBOXONE 8-2 MG FILM Take 1 Film by mouth in the morning and at bedtime.     . triamcinolone (NASACORT ALLERGY 24HR) 55 MCG/ACT AERO nasal inhaler Place 1 spray into the nose daily as needed (allergies).     Marland Kitchen oxyCODONE (OXY IR/ROXICODONE) 5 MG immediate release tablet Take 1 tablet (5 mg total) by mouth every 6 (six) hours as needed for severe pain (headache). (Patient not taking: Reported on 01/21/2020) 28 tablet 0  . predniSONE (DELTASONE) 20 MG tablet Take 1 tablet (20 mg total) by mouth daily with breakfast. 7 days (Patient not taking: Reported on 01/21/2020) 7 tablet 1  . sertraline (ZOLOFT) 50 MG tablet Take 1 tablet (50 mg total) by mouth daily. (Patient not taking: Reported on 01/21/2020) 90 tablet 1   No current facility-administered medications for this visit.        Physical Exam BP 133/82   Pulse 99   Ht 5\' 2"  (1.575 m)   Wt 188 lb (85.3 kg)   BMI 34.39 kg/m  Gen:  WD/WN, NAD Skin: incision right arm has dehisced.  It does not appear infected.  There is no drainage.  There is no odor.  There is some desiccated fat and subcutaneous tissues visible.  Right hand is pink and warm with brisk capillary refill and there is a palpable ulnar pulse     Assessment/Plan: 1. Wound dehiscence Patient is status post successful emergent right arm arterial reconstruction with complication of wound dehiscence.  It does not appear infective and therefore I will not begin antibiotics.  However dressing changes will be switched to Silvadene cream twice a day she can continue to wash the old ointment off with Hibiclens.  I will prescribe some pain pills to treat the localized pain.  She will follow-up  in 1 week      01/21/2020, 8:57 AM   This note was created with Dragon medical transcription system.  Any errors from dictation are unintentional.

## 2020-01-28 ENCOUNTER — Ambulatory Visit (INDEPENDENT_AMBULATORY_CARE_PROVIDER_SITE_OTHER): Payer: Medicaid Other | Admitting: Vascular Surgery

## 2020-01-29 ENCOUNTER — Emergency Department: Payer: Medicaid Other

## 2020-01-29 ENCOUNTER — Inpatient Hospital Stay
Admission: EM | Admit: 2020-01-29 | Discharge: 2020-02-08 | DRG: 189 | Disposition: A | Discharge status: Home Health | Payer: Medicaid Other | Attending: Internal Medicine | Admitting: Internal Medicine

## 2020-01-29 ENCOUNTER — Other Ambulatory Visit: Payer: Self-pay

## 2020-01-29 ENCOUNTER — Encounter: Payer: Self-pay | Admitting: Radiology

## 2020-01-29 DIAGNOSIS — J9621 Acute and chronic respiratory failure with hypoxia: Principal | ICD-10-CM | POA: Diagnosis present

## 2020-01-29 DIAGNOSIS — B974 Respiratory syncytial virus as the cause of diseases classified elsewhere: Secondary | ICD-10-CM | POA: Diagnosis present

## 2020-01-29 DIAGNOSIS — R0689 Other abnormalities of breathing: Secondary | ICD-10-CM | POA: Diagnosis not present

## 2020-01-29 DIAGNOSIS — Z7902 Long term (current) use of antithrombotics/antiplatelets: Secondary | ICD-10-CM | POA: Diagnosis not present

## 2020-01-29 DIAGNOSIS — Z955 Presence of coronary angioplasty implant and graft: Secondary | ICD-10-CM

## 2020-01-29 DIAGNOSIS — J441 Chronic obstructive pulmonary disease with (acute) exacerbation: Secondary | ICD-10-CM | POA: Insufficient documentation

## 2020-01-29 DIAGNOSIS — E0590 Thyrotoxicosis, unspecified without thyrotoxic crisis or storm: Secondary | ICD-10-CM | POA: Diagnosis present

## 2020-01-29 DIAGNOSIS — Z20822 Contact with and (suspected) exposure to covid-19: Secondary | ICD-10-CM | POA: Diagnosis present

## 2020-01-29 DIAGNOSIS — Z720 Tobacco use: Secondary | ICD-10-CM | POA: Diagnosis not present

## 2020-01-29 DIAGNOSIS — F17210 Nicotine dependence, cigarettes, uncomplicated: Secondary | ICD-10-CM | POA: Diagnosis present

## 2020-01-29 DIAGNOSIS — Z8249 Family history of ischemic heart disease and other diseases of the circulatory system: Secondary | ICD-10-CM | POA: Diagnosis not present

## 2020-01-29 DIAGNOSIS — R7989 Other specified abnormal findings of blood chemistry: Secondary | ICD-10-CM | POA: Diagnosis present

## 2020-01-29 DIAGNOSIS — I10 Essential (primary) hypertension: Secondary | ICD-10-CM | POA: Diagnosis not present

## 2020-01-29 DIAGNOSIS — J9811 Atelectasis: Secondary | ICD-10-CM | POA: Diagnosis present

## 2020-01-29 DIAGNOSIS — Z825 Family history of asthma and other chronic lower respiratory diseases: Secondary | ICD-10-CM

## 2020-01-29 DIAGNOSIS — K219 Gastro-esophageal reflux disease without esophagitis: Secondary | ICD-10-CM | POA: Diagnosis present

## 2020-01-29 DIAGNOSIS — F419 Anxiety disorder, unspecified: Secondary | ICD-10-CM | POA: Diagnosis present

## 2020-01-29 DIAGNOSIS — R778 Other specified abnormalities of plasma proteins: Secondary | ICD-10-CM | POA: Diagnosis present

## 2020-01-29 DIAGNOSIS — R0602 Shortness of breath: Secondary | ICD-10-CM | POA: Diagnosis not present

## 2020-01-29 DIAGNOSIS — R000 Tachycardia, unspecified: Secondary | ICD-10-CM | POA: Diagnosis present

## 2020-01-29 DIAGNOSIS — I248 Other forms of acute ischemic heart disease: Secondary | ICD-10-CM | POA: Diagnosis present

## 2020-01-29 DIAGNOSIS — Z818 Family history of other mental and behavioral disorders: Secondary | ICD-10-CM

## 2020-01-29 DIAGNOSIS — Z7951 Long term (current) use of inhaled steroids: Secondary | ICD-10-CM

## 2020-01-29 DIAGNOSIS — B338 Other specified viral diseases: Secondary | ICD-10-CM

## 2020-01-29 DIAGNOSIS — R918 Other nonspecific abnormal finding of lung field: Secondary | ICD-10-CM | POA: Diagnosis not present

## 2020-01-29 DIAGNOSIS — J80 Acute respiratory distress syndrome: Secondary | ICD-10-CM | POA: Diagnosis not present

## 2020-01-29 DIAGNOSIS — Z7982 Long term (current) use of aspirin: Secondary | ICD-10-CM | POA: Diagnosis not present

## 2020-01-29 DIAGNOSIS — Z79899 Other long term (current) drug therapy: Secondary | ICD-10-CM | POA: Diagnosis not present

## 2020-01-29 DIAGNOSIS — I200 Unstable angina: Secondary | ICD-10-CM | POA: Diagnosis not present

## 2020-01-29 DIAGNOSIS — S2241XD Multiple fractures of ribs, right side, subsequent encounter for fracture with routine healing: Secondary | ICD-10-CM | POA: Diagnosis not present

## 2020-01-29 DIAGNOSIS — J449 Chronic obstructive pulmonary disease, unspecified: Secondary | ICD-10-CM | POA: Diagnosis not present

## 2020-01-29 DIAGNOSIS — J984 Other disorders of lung: Secondary | ICD-10-CM | POA: Diagnosis not present

## 2020-01-29 DIAGNOSIS — R748 Abnormal levels of other serum enzymes: Secondary | ICD-10-CM | POA: Diagnosis not present

## 2020-01-29 DIAGNOSIS — T8131XD Disruption of external operation (surgical) wound, not elsewhere classified, subsequent encounter: Secondary | ICD-10-CM

## 2020-01-29 DIAGNOSIS — J8410 Pulmonary fibrosis, unspecified: Secondary | ICD-10-CM | POA: Diagnosis not present

## 2020-01-29 DIAGNOSIS — R0902 Hypoxemia: Secondary | ICD-10-CM | POA: Diagnosis not present

## 2020-01-29 DIAGNOSIS — R0989 Other specified symptoms and signs involving the circulatory and respiratory systems: Secondary | ICD-10-CM

## 2020-01-29 LAB — CBC WITH DIFFERENTIAL/PLATELET
Abs Immature Granulocytes: 0.08 10*3/uL — ABNORMAL HIGH (ref 0.00–0.07)
Basophils Absolute: 0.1 10*3/uL (ref 0.0–0.1)
Basophils Relative: 0 %
Eosinophils Absolute: 0.1 10*3/uL (ref 0.0–0.5)
Eosinophils Relative: 1 %
HCT: 46.3 % — ABNORMAL HIGH (ref 36.0–46.0)
Hemoglobin: 15 g/dL (ref 12.0–15.0)
Immature Granulocytes: 1 %
Lymphocytes Relative: 32 %
Lymphs Abs: 5.2 10*3/uL — ABNORMAL HIGH (ref 0.7–4.0)
MCH: 29.9 pg (ref 26.0–34.0)
MCHC: 32.4 g/dL (ref 30.0–36.0)
MCV: 92.2 fL (ref 80.0–100.0)
Monocytes Absolute: 1.8 10*3/uL — ABNORMAL HIGH (ref 0.1–1.0)
Monocytes Relative: 11 %
Neutro Abs: 9.2 10*3/uL — ABNORMAL HIGH (ref 1.7–7.7)
Neutrophils Relative %: 55 %
Platelets: 342 10*3/uL (ref 150–400)
RBC: 5.02 MIL/uL (ref 3.87–5.11)
RDW: 14.9 % (ref 11.5–15.5)
WBC Morphology: ABNORMAL
WBC: 16.4 10*3/uL — ABNORMAL HIGH (ref 4.0–10.5)
nRBC: 0 % (ref 0.0–0.2)

## 2020-01-29 LAB — SARS CORONAVIRUS 2 BY RT PCR (HOSPITAL ORDER, PERFORMED IN ~~LOC~~ HOSPITAL LAB): SARS Coronavirus 2: NEGATIVE

## 2020-01-29 LAB — BASIC METABOLIC PANEL
Anion gap: 18 — ABNORMAL HIGH (ref 5–15)
BUN: 16 mg/dL (ref 8–23)
CO2: 23 mmol/L (ref 22–32)
Calcium: 9.4 mg/dL (ref 8.9–10.3)
Chloride: 97 mmol/L — ABNORMAL LOW (ref 98–111)
Creatinine, Ser: 1.1 mg/dL — ABNORMAL HIGH (ref 0.44–1.00)
GFR calc Af Amer: >60 mL/min (ref >60)
GFR calc non Af Amer: 54 mL/min — ABNORMAL LOW (ref >60)
Glucose, Bld: 120 mg/dL — ABNORMAL HIGH (ref 70–99)
Potassium: 4.5 mmol/L (ref 3.5–5.1)
Sodium: 138 mmol/L (ref 135–145)

## 2020-01-29 LAB — TROPONIN I (HIGH SENSITIVITY)
Troponin I (High Sensitivity): 225 ng/L (ref <18)
Troponin I (High Sensitivity): 181 ng/L (ref <18)
Troponin I (High Sensitivity): 169 ng/L (ref <18)

## 2020-01-29 LAB — BRAIN NATRIURETIC PEPTIDE: B Natriuretic Peptide: 410.3 pg/mL — ABNORMAL HIGH (ref 0.0–100.0)

## 2020-01-29 MED ORDER — ASPIRIN 81 MG PO CHEW
324.0000 mg | CHEWABLE_TABLET | Freq: Once | ORAL | Status: AC
  Administered 2020-01-29: 324 mg via ORAL
  Filled 2020-01-29: qty 4

## 2020-01-29 MED ORDER — IPRATROPIUM-ALBUTEROL 0.5-2.5 (3) MG/3ML IN SOLN
9.0000 mL | Freq: Once | RESPIRATORY_TRACT | Status: AC
  Administered 2020-01-29: 9 mL via RESPIRATORY_TRACT

## 2020-01-29 MED ORDER — IPRATROPIUM-ALBUTEROL 0.5-2.5 (3) MG/3ML IN SOLN
RESPIRATORY_TRACT | Status: AC
  Administered 2020-01-29: 3 mL via RESPIRATORY_TRACT
  Filled 2020-01-29: qty 9

## 2020-01-29 MED ORDER — IPRATROPIUM-ALBUTEROL 0.5-2.5 (3) MG/3ML IN SOLN
3.0000 mL | Freq: Once | RESPIRATORY_TRACT | Status: AC
  Administered 2020-01-29: 3 mL via RESPIRATORY_TRACT

## 2020-01-29 MED ORDER — IPRATROPIUM-ALBUTEROL 0.5-2.5 (3) MG/3ML IN SOLN: 3.0000 mL | Freq: Once | RESPIRATORY_TRACT | Status: AC

## 2020-01-29 MED ORDER — IOHEXOL 350 MG/ML SOLN
75.0000 mL | Freq: Once | INTRAVENOUS | Status: AC | PRN
  Administered 2020-01-29: 75 mL via INTRAVENOUS

## 2020-01-29 MED ORDER — SODIUM CHLORIDE 0.9 % IV BOLUS
1000.0000 mL | Freq: Once | INTRAVENOUS | Status: AC
  Administered 2020-01-29: 1000 mL via INTRAVENOUS

## 2020-01-29 NOTE — ED Triage Notes (Signed)
Pt here via [facility] from home with difficulty breathing. Pt arrived on CPAP with sats in the low 80s. Pt has hx of copd.   Given by ems: 10 mg albuterol 1g combivent 2g mag 125 solumedrol

## 2020-01-29 NOTE — ED Notes (Signed)
Pt with labored breathing, MD aware, placed back on biPAP by RT.

## 2020-01-29 NOTE — H&P (Signed)
Christy Mason:[id number] DOB: 1961/06/13 DOA: 01/29/2020     PCP: [name], PA-C   Outpatient Specialists: v CARDS:   Dr. Olena Leatherwood? On 03/01/2020  Pulmonary   Dr. Jayme Cloud, Porfirio Mylar Erin Fulling, MD   Vasc surgery Schnier, Latina Craver, MD Patient arrived to ER on 01/29/20 at 1520 Referred by Attending Phineas Semen, MD   Patient coming from: home Lives alone,       Chief Complaint:   Chief Complaint  Patient presents with  . Respiratory Distress    HPI: Christy Mason is a 59 y.o. female with medical history significant of asthma COPD GERD, HTN, hyperthyroidism    Presented with severe shortness of breath EMS put her on a CPAP her oxygen saturation was in the low 80s EMS gave her 10 mg of albuterol 1 g of Combivent 2 meg grams of magnesium and 125 mg of Solu-Medrol.  Patient reports her shortness of breath started yesterday and progressively getting worse.  She got URI symptoms few days ago and so did her Sister. Her sister tested neg for Covid.  She tried to use her home breathing treatments did not seem to help.  When EMS got there they noticed that she was hypoxic.  She has not had any fevers she describes chest tightness  Recants and history is difficult for emergent right arm arterial reconstruction secondary to severe atherosclerotic changes of the right upper extremity resulting in the wrist pain complicated by wound dehiscence followed by vascular surgery Patient was treated with doxycycline 100 mg twice a day starting on 6 July for 7 days Patient is on aspirin and Plavix  Patient has history of COPD and takes low-dose prednisone. She continues to smoke. But wants to quit  Reports she is going to see a cardiologist next month due to tachycardia  Infectious risk factors:  Reports  fever, shortness of breath, dry cough     Has NOt been vaccinated against COVID    Initial COVID TEST  NEGATIVE   Lab Results  Component Value Date   SARSCOV2NAA NEGATIVE  01/29/2020   SARSCOV2NAA NEGATIVE 12/31/2019   SARSCOV2NAA NEGATIVE 11/15/2019   SARSCOV2NAA NEGATIVE 02/12/2019    Regarding pertinent Chronic problems:     HTN on Hyzaar      COPD - followed by pulmonology   not  on baseline oxygen  *L,      Chronic anemia - baseline hg Hemoglobin & Hematocrit  Recent Labs    01/03/20 0431 01/04/20 0442 01/29/20 1528  HGB 10.7* 10.9* 15.0     While in ER: Initially converted to BiPAP and now able to be weaned off On 2L but still increased work of breathing Patient remained with increased respiratory rate and had to be put back on BiPAP  ER Provider Called:  vasc  Surgery  Dr. Lorretta Harp  They Recommend admit to medicine  No CUTE CHANGES CONT PLAVIX  Hospitalist was called for admission for COPD exacerbation  The following Work up has been ordered so far:  Orders Placed This Encounter  Procedures  . SARS Coronavirus 2 by RT PCR (hospital order, performed in [facility] hospital lab) Nasopharyngeal Nasopharyngeal Swab  . DG Chest Portable 1 View  . CT Angio Chest PE W and/or Wo Contrast  . CBC with Differential  . Basic metabolic panel  . Brain natriuretic peptide  . Consult to hospitalist  ALL PATIENTS BEING ADMITTED/HAVING PROCEDURES NEED COVID-19 SCREENING  . Bipap  . EKG 12-Lead  .  ED EKG  . EKG 12-Lead    Following Medications were ordered in ER: Medications  ipratropium-albuterol (DUONEB) 0.5-2.5 (3) MG/3ML nebulizer solution 9 mL (9 mLs Nebulization Given 01/29/20 1529)  ipratropium-albuterol (DUONEB) 0.5-2.5 (3) MG/3ML nebulizer solution 3 mL (3 mLs Nebulization Given 01/29/20 1534)  ipratropium-albuterol (DUONEB) 0.5-2.5 (3) MG/3ML nebulizer solution 3 mL (3 mLs Nebulization Given 01/29/20 1533)  ipratropium-albuterol (DUONEB) 0.5-2.5 (3) MG/3ML nebulizer solution 3 mL (3 mLs Nebulization Given 01/29/20 1533)  sodium chloride 0.9 % bolus 1,000 mL (0 mLs Intravenous Stopped 01/29/20 1857)  iohexol (OMNIPAQUE) 350 MG/ML injection  75 mL (75 mLs Intravenous Contrast Given 01/29/20 1650)        Consult Orders  (From admission, onward)         Start     Ordered   01/29/20 1844  Consult to hospitalist  ALL PATIENTS BEING ADMITTED/HAVING PROCEDURES NEED COVID-19 SCREENING  Once       Comments: ALL PATIENTS BEING ADMITTED/HAVING PROCEDURES NEED COVID-19 SCREENING  Provider:  (Not yet assigned)  Question Answer Comment  Place call to: hospitalist   Reason for Consult Admit   Diagnosis/Clinical Info for Consult: copd, shortness of breath      01/29/20 1843           Significant initial  Findings: Abnormal Labs Reviewed  CBC WITH DIFFERENTIAL/PLATELET - Abnormal; Notable for the following components:      Result Value   WBC 16.4 (*)    HCT 46.3 (*)    Neutro Abs 9.2 (*)    Lymphs Abs 5.2 (*)    Monocytes Absolute 1.8 (*)    Abs Immature Granulocytes 0.08 (*)    All other components within normal limits  BASIC METABOLIC PANEL - Abnormal; Notable for the following components:   Chloride 97 (*)    Glucose, Bld 120 (*)    Creatinine, Ser 1.10 (*)    GFR calc non Af Amer 54 (*)    Anion gap 18 (*)    All other components within normal limits  BRAIN NATRIURETIC PEPTIDE - Abnormal; Notable for the following components:   B Natriuretic Peptide 410.3 (*)    All other components within normal limits  BLOOD GAS, ARTERIAL - Abnormal; Notable for the following components:   pO2, Arterial 119 (*)    All other components within normal limits  TROPONIN I (HIGH SENSITIVITY) - Abnormal; Notable for the following components:   Troponin I (High Sensitivity) 225 (*)    All other components within normal limits  TROPONIN I (HIGH SENSITIVITY) - Abnormal; Notable for the following components:   Troponin I (High Sensitivity) 181 (*)    All other components within normal limits  TROPONIN I (HIGH SENSITIVITY) - Abnormal; Notable for the following components:   Troponin I (High Sensitivity) 169 (*)    All other components  within normal limits    Otherwise labs showing:    Recent Labs  Lab 01/29/20 1528  NA 138  K 4.5  CO2 23  GLUCOSE 120*  BUN 16  CREATININE 1.10*  CALCIUM 9.4    Cr   Up from baseline see below Lab Results  Component Value Date   CREATININE 1.10 (H) 01/29/2020   CREATININE 0.79 01/04/2020   CREATININE 0.77 01/03/2020    No results for input(s): AST, ALT, ALKPHOS, BILITOT, PROT, ALBUMIN in the last 168 hours. Lab Results  Component Value Date   CALCIUM 9.4 01/29/2020   PHOS 4.0 01/01/2020      WBC  Component Value Date/Time   WBC 16.4 (H) 01/29/2020 1528   ANC    Component Value Date/Time   NEUTROABS 9.2 (H) 01/29/2020 1528   NEUTROABS 6.2 12/13/2018 1552   ALC No components found for: LYMPHAB    Plt: Lab Results  Component Value Date   PLT 342 01/29/2020        ABG    Component Value Date/Time   PHART 7.43 01/29/2020 2149   PCO2ART 33 01/29/2020 2149   PO2ART 119 (H) 01/29/2020 2149   HCO3 21.9 01/29/2020 2149   ACIDBASEDEF 1.7 01/29/2020 2149   O2SAT 98.7 01/29/2020 2149    HG/HCT  stable,      Component Value Date/Time   HGB 15.0 01/29/2020 1528   HGB 13.2 12/13/2018 1552   HCT 46.3 (H) 01/29/2020 1528   HCT 37.6 12/13/2018 1552    No results for input(s): LIPASE, AMYLASE in the last 168 hours. No results for input(s): AMMONIA in the last 168 hours.    Troponin 225 -181 169     ECG: Ordered Personally reviewed by me showing: HR : 134 Rhythm:   Sinus tachycardia    no evidence of ischemic changes QTC 478   BNP (last 3 results) Recent Labs    01/29/20 1528  BNP 410.3*    DM  labs:  HbA1C: No results for input(s): HGBA1C in the last 8760 hours.     CBG (last 3)  No results for input(s): GLUCAP in the last 72 hours.     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    ED Triage Vitals  Enc Vitals Group     BP 01/29/20 1552 (!) 62/30     Pulse Rate 01/29/20 1558 89     Resp 01/29/20 1600 (!) 33     Temp 01/29/20 1603 98.2 F (36.8 C)     Temp Source 01/29/20 1603 Oral     SpO2 01/29/20 1558 97 %     Weight 01/29/20 1524 188 lb (85.3 kg)     Height 01/29/20 1524 5\' 2"  (1.575 m)     Head Circumference --      Peak Flow --      Pain Score 01/29/20 1524 0     Pain Loc --      Pain Edu? --      Excl. in GC? --   TMAX(24)@       Latest  Blood pressure 91/70, pulse (!) 28, temperature 98.2 F (36.8 C), temperature source Oral, resp. rate (!) 33, height 5\' 2"  (1.575 m), weight 85.3 kg, SpO2 98 %.     Review of Systems:    Pertinent positives include:   Fatigue  shortness of breath at rest.   dyspnea on exertion  Constitutional:  No weight loss, night sweats, Fevers, chills,, weight loss  HEENT:  No headaches, Difficulty swallowing,Tooth/dental problems,Sore throat,  No sneezing, itching, ear ache, nasal congestion, post nasal drip,  Cardio-vascular:  No chest pain, Orthopnea, PND, anasarca, dizziness, palpitations.no Bilateral lower extremity swelling  GI:  No heartburn, indigestion, abdominal pain, nausea, vomiting, diarrhea, change in bowel habits, loss of appetite, melena, blood in stool, hematemesis Resp:  no, No excess mucus, no productive  cough, No non-productive cough, No coughing up of blood.No change in color of mucus.No wheezing. Skin:  no rash or lesions. No jaundice GU:  no dysuria, change in color of urine, no urgency or frequency. No straining to urinate.  No flank pain.  Musculoskeletal:  No joint pain or no joint swelling. No decreased range of motion. No back pain.  Psych:  No change in mood or affect. No depression or anxiety. No memory loss.  Neuro: no localizing neurological complaints, no tingling, no weakness, no double vision, no gait abnormality, no slurred speech, no  confusion  All systems reviewed and apart from HOPI all are negative  Past Medical History:   Past Medical History:  Diagnosis Date  . Allergy   . Anxiety   . Arthritis   . Asthma   . Complication of anesthesia   . COPD (chronic obstructive pulmonary disease) (HCC)   . Emphysema of lung (HCC)   . GERD (gastroesophageal reflux disease)   . History of hiatal hernia   . Hypertension   . Hyperthyroidism    h/o  . Neuromuscular disorder (HCC)   . PONV (postoperative nausea and vomiting)   . Thyroid disease       Past Surgical History:  Procedure Laterality Date  . ANGIOPLASTY Right 01/01/2020   Procedure: ANGIOPLASTY ans STENT PLACEMENT RIGHT SUBCLAVIAN AND AXILLARY ARTERY;  Surgeon: Renford Dills, MD;  Location: [facility] ORS;  Service: Vascular;  Laterality: Right;  . CARPAL TUNNEL RELEASE Right   . CHOLECYSTECTOMY    . FRACTURE SURGERY Right    plates  . FUNCTIONAL ENDOSCOPIC SINUS SURGERY    . SALPINGOOPHORECTOMY Right   . THROMBECTOMY BRACHIAL ARTERY Right 01/01/2020   Procedure: THROMBECTOMY BRACHIAL AXILLARY ARTERY;  Surgeon: Renford Dills, MD;  Location: [facility] ORS;  Service: Vascular;  Laterality: Right;  . TUBAL LIGATION    . UPPER EXTREMITY ANGIOGRAPHY Right 01/23/2019   Procedure: UPPER EXTREMITY ANGIOGRAPHY;  Surgeon: Renford Dills, MD;  Location: [facility] INV[facility]IVE [facility];  Service: Cardiovascular;  Laterality: Right;  . UPPER EXTREMITY ANGIOGRAPHY Right 02/07/2019   Procedure: UPPER EXTREMITY ANGIOGRAPHY;  Surgeon: Renford Dills, MD;  Location: [facility] IN[facility] CV [facility];  Service: Cardiovascular;  Laterality: Right;  . UPPER EXTREMITY ANGIOGRAPHY Right 01/01/2020   Procedure: UPPER EXTREMITY ANGIOGRAPHY;  Surgeon: Renford Dills, MD;  Location: [facility] IN[facility] CV [facility];  Service: Cardiovascular;  Laterality: Right;    Social History:  Ambulatory  independently       reports that she has been smoking cigarettes. She started smoking about 45 years ago. She  has a 45.00 pack-year smoking history. She has never used smokeless tobacco. She reports previous alcohol use. She reports previous drug use.   Family History:   Family History  Problem Relation Age of Onset  . Depression Mother   . Hypertension Mother   . COPD Mother   . Diabetes Father   . Hypertension Father     Allergies: Allergies  Allergen Reactions  . Acetaminophen     Avoid due to fatty liver   . Nsaids Other (See Comments)    irritates stomach severley   . Other     Steroids-muscle weakness and headache  . Sular [Nisoldipine Er] Palpitations     Prior to Admission medications   Medication Sig Start Date End Date Taking? Authorizing Provider  ADVAIR DISKUS 250-50 MCG/DOSE AEPB INHALE 1 PUFF INTO THE LUNGS TWICE DAILY 01/04/20   [name], PA-C  albuterol (PROVENTIL) (2.5  MG/3ML) 0.083% nebulizer solution Take 3 mLs (2.5 mg total) by nebulization every 4 (four) hours as needed for wheezing or shortness of breath. 10/30/19   Erin FullingKasa, Kurian, MD  Ascorbic Acid (VITAMIN C PO) Take 1 tablet by mouth daily.    [provider]  aspirin EC 81 MG tablet Take 81 mg by mouth daily.    [provider]  Aspirin-Caffeine (BC FAST PAIN RELIEF PO) Take 1 packet by mouth daily as needed (pain).    [provider]  cetirizine (ZYRTEC) 10 MG tablet Take 10 mg by mouth daily.    [provider]  cholecalciferol (VITAMIN D3) 25 MCG (1000 UNIT) tablet Take 1,000 Units by mouth daily.    [provider]  clopidogrel (PLAVIX) 75 MG tablet Take 1 tablet (75 mg total) by mouth daily. 01/05/20   Stegmayer, Cala BradfordKimberly A, PA-C  diphenhydrAMINE HCl (ZZZQUIL) 50 MG/30ML LIQD Take 50 mg by mouth at bedtime.    [provider]  doxycycline (VIBRA-TABS) 100 MG tablet Take 1 tablet (100 mg total) by mouth 2 (two) times daily. 01/15/20   Trey SailorsPollak, Adriana M, PA-C  losartan-hydrochlorothiazide (HYZAAR) 50-12.5 MG tablet Take 1 tablet by mouth daily. 09/06/19    Trey SailorsPollak, Adriana M, PA-C  Multiple Vitamin (MULTIVITAMIN WITH MINERALS) TABS tablet Take 1 tablet by mouth daily.    [provider]  nicotine (NICODERM CQ) 21 mg/24hr patch Place 1 patch (21 mg total) onto the skin daily. 01/11/20   Trey SailorsPollak, Adriana M, PA-C  oxyCODONE (OXY IR/ROXICODONE) 5 MG immediate release tablet Take 1 tablet (5 mg total) by mouth every 6 (six) hours as needed for severe pain (headache). 01/21/20   Schnier, Latina CraverGregory G, MD  predniSONE (DELTASONE) 20 MG tablet Take 1 tablet (20 mg total) by mouth daily with breakfast. 7 days Patient not taking: Reported on 01/21/2020 10/30/19   Erin FullingKasa, Kurian, MD  PROAIR HFA 108 (90 Base) MCG/ACT inhaler INHALE 2 PUFFS INTO THE LUNGS EVERY 6 HOURS AS NEEDED FOR WHEEZING OR SHORTNESS OF BREATH Patient taking differently: Inhale 2 puffs into the lungs every 6 (six) hours as needed for wheezing or shortness of breath.  11/05/19   Trey SailorsPollak, Adriana M, PA-C  sertraline (ZOLOFT) 50 MG tablet Take 1 tablet (50 mg total) by mouth daily. Patient not taking: Reported on 01/21/2020 09/06/19   Trey SailorsPollak, Adriana M, PA-C  silver sulfADIAZINE (SILVADENE) 1 % cream Apply 1 application topically daily. 01/21/20   Schnier, Latina CraverGregory G, MD  SPIRIVA HANDIHALER 18 MCG inhalation capsule INHALE CONTENTS OF 1 CAPSULE ONCE DAILY USING HANDIHALER Patient taking differently: Place 18 mcg into inhaler and inhale daily.  10/03/19   Trey SailorsPollak, Adriana M, PA-C  SUBOXONE 8-2 MG FILM Take 1 Film by mouth in the morning and at bedtime.  12/21/18   [provider]  triamcinolone (NASACORT ALLERGY 24HR) 55 MCG/ACT AERO nasal inhaler Place 1 spray into the nose daily as needed (allergies).     [provider]   Physical Exam: Vitals with BMI 01/29/2020 01/29/2020 01/29/2020  Height - - -  Weight - - -  BMI - - -  Systolic 91 78 62  Diastolic 70 22 30  Pulse 28 89 -     1. General:  in No Acute distress    Chronically ill  -appearing 2. Psychological: Alert and    Oriented 3. Head/ENT:    Dry Mucous Membranes  Head Non traumatic, neck supple                          Poor Dentition 4. SKIN:    decreased Skin turgor,  Skin clean Dry and intact no rash 5. Heart: Regular rate and rhythm no  Murmur, no Rub or gallop 6. Lungs:soem  wheezes diminished 7. Abdomen: Soft, non-tender, Non distended obese  bowel sounds present 8. Lower extremities: no clubbing, cyanosis, no edema 9. Neurologically Grossly intact, moving all 4 extremities equally  10. MSK: Normal range of motion   All other LABS:     Recent Labs  Lab 01/29/20 1528  WBC 16.4*  NEUTROABS 9.2*  HGB 15.0  HCT 46.3*  MCV 92.2  PLT 342     Recent Labs  Lab 01/29/20 1528  NA 138  K 4.5  CL 97*  CO2 23  GLUCOSE 120*  BUN 16  CREATININE 1.10*  CALCIUM 9.4     No results for input(s): AST, ALT, ALKPHOS, BILITOT, PROT, ALBUMIN in the last 168 hours.     Cultures:    Component Value Date/Time   SDES BLOOD BLOOD LEFT HAND 02/12/2019 1026   SDES BLOOD BLOOD LEFT FOREARM 02/12/2019 1026   SPECREQUEST  02/12/2019 1026    BOTTLES DRAWN AEROBIC AND ANAEROBIC Blood Culture adequate volume   SPECREQUEST  02/12/2019 1026    BOTTLES DRAWN AEROBIC AND ANAEROBIC Blood Culture results may not be optimal due to an excessive volume of blood received in culture bottles   CULT  02/12/2019 1026    NO GROWTH 5 DAYS Performed at [facility], [address] [place], [place] [address]    CULT  02/12/2019 1026    NO GROWTH 5 DAYS Performed at [facility], [address] [place], [place] [address]    REPTSTATUS 02/17/2019 FINAL 02/12/2019 1026   REPTSTATUS 02/17/2019 FINAL 02/12/2019 1026     Radiological Exams on Admission: CT Angio Chest PE W and/or Wo Contrast  Result Date: 01/29/2020 CLINICAL DATA:  Dyspnea, hypoxia, COPD EXAM: CT ANGIOGRAPHY CHEST WITH CONTRAST TECHNIQUE: Multidetector CT imaging of the chest was performed using the  standard protocol during bolus administration of intravenous contrast. Multiplanar CT image reconstructions and MIPs were obtained to evaluate the vascular anatomy. CONTRAST:  23mL OMNIPAQUE IOHEXOL 350 MG/ML SOLN COMPARISON:  05/31/2019 FINDINGS: Cardiovascular: There is adequate opacification of the pulmonary arterial tree. No intraluminal filling defect is seen to 6 chest acute pulmonary embolism. The central pulmonary arteries are of normal caliber. No significant coronary artery calcification. Mild left ventricular hypertrophy. Global cardiac size is within normal limits. No pericardial effusion. The thoracic aorta is suboptimally opacified, but appears unremarkable. There is suggestion of a dissection flap within the brachiocephalic artery arising at its origin. The left subclavian artery has been stented from its origin through the left axillary artery. Patency of the stented segment is not well assessed on this examination. Mediastinum/Nodes: No pathologic thoracic adenopathy. Lungs/Pleura: The lungs are symmetrically hyperinflated in keeping with changes of underlying COPD. This appears stable since prior examination. Densely calcified granuloma is seen within the basilar right middle lobe. The lungs are otherwise clear. No pneumothorax or pleural effusion. Central airways are widely patent. Upper Abdomen: No acute abnormality. Musculoskeletal: Remote fracture deformity of the mid diaphysis of the right clavicle. ORIF of the right third, fourth, and fifth ribs with instrumentation noted antral laterally. Multiple healed additional right rib fractures noted. Healed fracture of  the left fourth rib posteriorly noted. Severe anterior wedge compression fracture of T5 is unchanged. Similarly, mild compression fracture of T9 is also unchanged. No acute bone abnormality. Review of the MIP images confirms the above findings. IMPRESSION: No acute pulmonary embolism. Mild left ventricular hypertrophy. COPD with stable  pulmonary hyperinflation. 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 Patency of the stented segment is not well assessed on this examination. Electronically Signed   By: Helyn Numbers MD   On: 01/29/2020 17:35   DG Chest Portable 1 View  Result Date: 01/29/2020 CLINICAL DATA:  Shortness of breath EXAM: PORTABLE CHEST 1 VIEW COMPARISON:  01/02/2020, CT 05/31/2019 FINDINGS: Right subclavian and axillary stent. Surgical plate and fixating screws along the right ribs. Hyperinflation with emphysematous disease and bronchitic change. No consolidation, pleural effusion, or pneumothorax. Stable cardiomediastinal silhouette. IMPRESSION: No active cardiopulmonary disease. Hyperinflation with emphysematous disease and bronchitic changes. Electronically Signed   By: Jasmine Pang M.D.   On: 01/29/2020 16:02    Chart has been reviewed   Assessment/Plan  59 y.o. female with medical history significant of asthma COPD GERD, HTN, hyperthyroidism Admitted for COPD exacerbation  Present on Admission: . COPD exacerbation (HCC) -   Will initiate: Steroid taper  -  Antibiotics azithromycine - Albuterol PRN, - scheduled duoneb,  -  Breo or Dulera at discharge   -  Mucinex.  Titrate O2 to saturation >90%. Follow patients respiratory status.  continue on BiPAP for increased work of breathing  Currently mentating well no evidence of symptomatic hypercarbia   . Essential hypertension - stable resume home meds when ABLE  . Elevated troponin -  -no chest pain no EKG changes in the setting of  increased work of breathing and   likely due to demand ischemia   monitor on telemetry and cycle cardiac enzymes to trend.  if continues to rise will need further work-up so far has been going down   . 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 Gilda Crease He feels this is non-acute chronic finding, continue plavix  Other plan as per orders.  DVT prophylaxis:  SCD      Code Status:    Code Status: Prior FULL CODE     per patient  I had personally discussed CODE STATUS with patient       Family Communication:   Family not at  Bedside    Disposition Plan:    To home once workup is complete and patient is stable   Following barriers for discharge:                                                          Will need to be able to tolerate PO  Will likely need home health, home O2, set up                           Will need consultants to evaluate patient prior to discharge                      Would benefit from PT/OT eval prior to DC  Ordered                    Consults called:   Notified Dr. Gilda Crease with vascular through [facility]  Admission status:  ED Disposition    None        inpatient     I Expect 2 midnight stay secondary to severity of patient's current illness need for inpatient interventions justified by the following:  hemodynamic instability despite optimal treatment (  tachypnea  hypoxia,  )     and extensive comorbidities including:   COPD/asthma   That are currently affecting medical management.   I expect  patient to be hospitalized for 2 midnights requiring inpatient medical care.  Patient is at high risk for adverse outcome (such as loss of life or disability) if not treated.  Indication for inpatient stay as follows:    Hemodynamic instability despite maximal medical therapy,     Need for operative/procedural  intervention New or worsening  hypoxia  Need for IV antibiotics, IV fluids,     Level of care progressive      Lab Results  Component Value Date   SARSCOV2NAA NEGATIVE 01/29/2020     Precautions: admitted as  Covid Negative     PPE: Used by the provider:   N95   eye Goggles,  Gloves     Meta Kroenke 01/29/2020, 8:07 PM    [facility]     after 2 AM please page floor coverage PA If 7AM-7PM, please contact the day team taking care of the patient using [email or web address]   Patient was evaluated in the context of the global COVID-19 pandemic, which necessitated consideration that the patient might be at risk for infection with the SARS-CoV-2 virus that causes COVID-19. Institutional protocols and algorithms that pertain to the evaluation of patients at risk for COVID-19 are in a state of rapid change based on information released by regulatory bodies including the CDC and federal and state organizations. These policies and algorithms were followed during the patient's care.

## 2020-01-29 NOTE — ED Notes (Signed)
Pt taken off BiPAP by respiratory.

## 2020-01-29 NOTE — ED Provider Notes (Signed)
[facility] Emergency Department Provider Note   ____________________________________________   I have reviewed the triage vital signs and the nursing notes.   HISTORY  Chief Complaint Respiratory Distress   History limited by: Not Limited   HPI Christy Mason is a 59 y.o. female who presents to the emergency department today because of concerns for respiratory distress.  Patient arrived via EMS as emergency traffic.  Patient states her shortness of breath started yesterday.  Progressively got worse today.  She has tried her home breathing treatments without any success.  Per EMS upon first responders arrival the patient was hypoxic.  She was placed on CPAP by EMS however became quite agitated on the CPAP.  Patient denies any fevers.  She does states she has had some chest tightness.  Records reviewed. Per medical record review patient has a history of COPD  Past Medical History:  Diagnosis Date  . Allergy   . Anxiety   . Arthritis   . Asthma   . Complication of anesthesia   . COPD (chronic obstructive pulmonary disease) (HCC)   . Emphysema of lung (HCC)   . GERD (gastroesophageal reflux disease)   . History of hiatal hernia   . Hypertension   . Hyperthyroidism    h/o  . Neuromuscular disorder (HCC)   . PONV (postoperative nausea and vomiting)   . Thyroid disease     Patient Active Problem List   Diagnosis Date Noted  . Wound dehiscence 01/21/2020  . Subclavian arterial stenosis (HCC) 01/01/2020  . Thoracic radiculitis 03/12/2019  . Tendonitis of long head of biceps brachii of right shoulder 02/19/2019  . 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 Arm pain, central, right 02/07/2019  . 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 COPD (chronic obstructive pulmonary disease) (HCC) 01/08/2019  . Essential hypertension 01/08/2019  . Complication associated with vascular device, sequela 01/08/2019    Past Surgical History:   Procedure Laterality Date  . ANGIOPLASTY Right 01/01/2020   Procedure: ANGIOPLASTY ans STENT PLACEMENT RIGHT SUBCLAVIAN AND AXILLARY ARTERY;  Surgeon: Renford Dills, MD;  Location: [facility] ORS;  Service: Vascular;  Laterality: Right;  . CARPAL TUNNEL RELEASE Right   . CHOLECYSTECTOMY    . FRACTURE SURGERY Right    plates  . FUNCTIONAL ENDOSCOPIC SINUS SURGERY    . SALPINGOOPHORECTOMY Right   . THROMBECTOMY BRACHIAL ARTERY Right 01/01/2020   Procedure: THROMBECTOMY BRACHIAL AXILLARY ARTERY;  Surgeon: Renford Dills, MD;  Location: [facility] ORS;  Service: Vascular;  Laterality: Right;  . TUBAL LIGATION    . UPPER EXTREMITY ANGIOGRAPHY Right 01/23/2019   Procedure: UPPER EXTREMITY ANGIOGRAPHY;  Surgeon: Renford Dills, MD;  Location: [facility] INVASIVE CV LAB;  Service: Cardiovascular;  Laterality: Right;  . UPPER EXTREMITY ANGIOGRAPHY Right 02/07/2019   Procedure: UPPER EXTREMITY ANGIOGRAPHY;  Surgeon: Renford Dills, MD;  Location: [facility] INVASIVE CV LAB;  Service: Cardiovascular;  Laterality: Right;  . UPPER EXTREMITY ANGIOGRAPHY Right 01/01/2020   Procedure: UPPER EXTREMITY ANGIOGRAPHY;  Surgeon: Renford Dills, MD;  Location: [facility] INVASIVE CV LAB;  Service: Cardiovascular;  Laterality: Right;    Prior to Admission medications   Medication Sig Start Date End Date Taking? Authorizing Provider  ADVAIR DISKUS 250-50 MCG/DOSE AEPB INHALE 1 PUFF INTO THE LUNGS TWICE DAILY 01/04/20   [name], PA-C  albuterol (PROVENTIL) (2.5 MG/3ML) 0.083% nebulizer solution Take 3 mLs (2.5 mg total) by nebulization every 4 (four) hours as needed for wheezing or shortness of  breath. 10/30/19   Erin Fulling, MD  Ascorbic Acid (VITAMIN C PO) Take 1 tablet by mouth daily.    [provider]  aspirin EC 81 MG tablet Take 81 mg by mouth daily.    [provider]  Aspirin-Caffeine (BC FAST PAIN RELIEF PO) Take 1 packet by mouth daily as needed (pain).    [provider]   cetirizine (ZYRTEC) 10 MG tablet Take 10 mg by mouth daily.    [provider]  cholecalciferol (VITAMIN D3) 25 MCG (1000 UNIT) tablet Take 1,000 Units by mouth daily.    [provider]  clopidogrel (PLAVIX) 75 MG tablet Take 1 tablet (75 mg total) by mouth daily. 01/05/20   Stegmayer, Cala Bradford A, PA-C  diphenhydrAMINE HCl (ZZZQUIL) 50 MG/30ML LIQD Take 50 mg by mouth at bedtime.    [provider]  doxycycline (VIBRA-TABS) 100 MG tablet Take 1 tablet (100 mg total) by mouth 2 (two) times daily. 01/15/20   [name], PA-C  losartan-hydrochlorothiazide (HYZAAR) 50-12.5 MG tablet Take 1 tablet by mouth daily. 09/06/19   [name], PA-C  Multiple Vitamin (MULTIVITAMIN WITH MINERALS) TABS tablet Take 1 tablet by mouth daily.    [provider]  nicotine (NICODERM CQ) 21 mg/24hr patch Place 1 patch (21 mg total) onto the skin daily. 01/11/20   [name], PA-C  oxyCODONE (OXY IR/ROXICODONE) 5 MG immediate release tablet Take 1 tablet (5 mg total) by mouth every 6 (six) hours as needed for severe pain (headache). 01/21/20   Schnier, Latina Craver, MD  predniSONE (DELTASONE) 20 MG tablet Take 1 tablet (20 mg total) by mouth daily with breakfast. 7 days Patient not taking: Reported on 01/21/2020 10/30/19   Erin Fulling, MD  PROAIR HFA 108 (90 Base) MCG/ACT inhaler INHALE 2 PUFFS INTO THE LUNGS EVERY 6 HOURS AS NEEDED FOR WHEEZING OR SHORTNESS OF BREATH Patient taking differently: Inhale 2 puffs into the lungs every 6 (six) hours as needed for wheezing or shortness of breath.  11/05/19   [name], PA-C  sertraline (ZOLOFT) 50 MG tablet Take 1 tablet (50 mg total) by mouth daily. Patient not taking: Reported on 01/21/2020 09/06/19   [name], PA-C  silver sulfADIAZINE (SILVADENE) 1 % cream Apply 1 application topically daily. 01/21/20   Schnier, Latina Craver, MD  SPIRIVA HANDIHALER 18 MCG inhalation capsule INHALE CONTENTS OF 1 CAPSULE ONCE DAILY  USING HANDIHALER Patient taking differently: Place 18 mcg into inhaler and inhale daily.  10/03/19   [name], PA-C  SUBOXONE 8-2 MG FILM Take 1 Film by mouth in the morning and at bedtime.  12/21/18   [provider]  triamcinolone (NASACORT ALLERGY 24HR) 55 MCG/ACT AERO nasal inhaler Place 1 spray into the nose daily as needed (allergies).     [provider]    Allergies Acetaminophen, Nsaids, Other, and Sular [nisoldipine er]  Family History  Problem Relation Age of Onset  . Depression Mother   . Hypertension Mother   . COPD Mother   . Diabetes Father   . Hypertension Father     Social History Social History   Tobacco Use  . Smoking status: Current Every Day Smoker    Packs/day: 1.00    Years: 45.00    Pack years: 45.00    Types: Cigarettes    Start date: 16  . Smokeless tobacco: Never Used  . Tobacco comment: quit for 16 years started again 07/2014  Vaping Use  .  Vaping Use: Never used  Substance Use Topics  . Alcohol use: Not Currently  . Drug use: Not Currently    Review of Systems Constitutional: No fever/chills Eyes: No visual changes. ENT: No sore throat. Cardiovascular: Positive for chest tightness Respiratory: Positive for shortness of breath. Gastrointestinal: No abdominal pain.  No nausea, no vomiting.  No diarrhea.   Genitourinary: Negative for dysuria. Musculoskeletal: Negative for back pain. Skin: Negative for rash. Neurological: Negative for headaches, focal weakness or numbness.  ____________________________________________   PHYSICAL EXAM:  VITAL SIGNS: ED Triage Vitals [01/29/20 1524]  Enc Vitals Group     BP      Pulse      Resp      Temp      Temp src      SpO2      Weight 188 lb (85.3 kg)     Height 5\' 2"  (1.575 m)     Head Circumference      Peak Flow      Pain Score 0   Constitutional: Alert and oriented.  Eyes: Conjunctivae are normal.  ENT      Head: Normocephalic and atraumatic.      Nose:  No congestion/rhinnorhea.      Mouth/Throat: Mucous membranes are moist.      Neck: No stridor. Hematological/Lymphatic/Immunilogical: No cervical lymphadenopathy. Cardiovascular: Tachycardic, regular rhythm.  No murmurs, rubs, or gallops.  Respiratory: Respiratory distress. Increased rate and effort. Diffuse wheezing. Poor air movement.  Gastrointestinal: Soft and non tender. No rebound. No guarding.  Genitourinary: Deferred Musculoskeletal: Normal range of motion in all extremities. No lower extremity edema. Neurologic:  Normal speech and language. No gross focal neurologic deficits are appreciated.  Skin:  Skin is warm, dry and intact. No rash noted. Psychiatric: Mood and affect are normal. Speech and behavior are normal. Patient exhibits appropriate insight and judgment.  ____________________________________________    LABS (pertinent positives/negatives)  CBC wbc 16.4, hgb 15.0, plt 342 BNP 410.3 COVID negative CBC wbc 16.4, hgb 15.0, plt 342 BMP wnl except cl 97, glu 120, cr 1.10 ____________________________________________   EKG  I, Phineas SemenGraydon Kileen Lange, attending physician, personally viewed and interpreted this EKG  EKG Time: 1529 Rate: 134 Rhythm: sinus tachycardia Axis: normal Intervals: qtc 478 QRS: narrow ST changes: no st elevation Impression: abnormal ekg   ____________________________________________    RADIOLOGY  CXR No acute abnormality  CT angio No PE. 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  ____________________________________________   PROCEDURES  Procedures  CRITICAL CARE Performed by: Phineas SemenGraydon Fianna Snowball   Total critical care time: 35 minutes  Critical care time was exclusive of separately billable procedures and treating other patients.  Critical care was necessary to treat or prevent imminent or life-threatening deterioration.  Critical care was time spent personally by me on the following activities: development of  treatment plan with patient and/or surrogate as well as nursing, discussions with consultants, evaluation of patient's response to treatment, examination of patient, obtaining history from patient or surrogate, ordering and performing treatments and interventions, ordering and review of laboratory studies, ordering and review of radiographic studies, pulse oximetry and re-evaluation of patient's condition.  ____________________________________________   INITIAL IMPRESSION / ASSESSMENT AND PLAN / ED COURSE  Pertinent labs & imaging results that were available during my care of the patient were reviewed by me and considered in my medical decision making (see chart for details).   Patient presented to the emergency department today because of concerns for shortness of breath. Patient initially was  brought in with CPAP. This was converted to BiPAP and patient appeared to tolerate BiPAP much better. EMS had already administered magnesium and Solu-Medrol. Patient was given DuoNeb treatments here in the emergency department. She was able to be taken off of BiPAP here in the emergency department. I did obtain a CT angio given elevation of troponin and recent procedure. 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 I discussed with Dr. Gilda Crease with vascular surgery who stated that this was a known finding did not require any acute intervention. Will plan on admission to the hospitalist service. Discussed findings and plan with patient.   ____________________________________________   FINAL CLINICAL IMPRESSION(S) / ED DIAGNOSES  Final diagnoses:  COPD exacerbation (HCC)  Elevated troponin     Note: This dictation was prepared with Dragon dictation. Any transcriptional errors that result from this process are unintentional     Phineas Semen, MD 01/29/20 [phone]

## 2020-01-30 ENCOUNTER — Other Ambulatory Visit (INDEPENDENT_AMBULATORY_CARE_PROVIDER_SITE_OTHER): Payer: Medicaid Other

## 2020-01-30 ENCOUNTER — Ambulatory Visit (INDEPENDENT_AMBULATORY_CARE_PROVIDER_SITE_OTHER): Payer: Medicaid Other | Admitting: Nurse Practitioner

## 2020-01-30 ENCOUNTER — Inpatient Hospital Stay
Admit: 2020-01-30 | Discharge: 2020-01-30 | Disposition: A | Discharge status: Home / Self Care | Payer: Medicaid Other | Attending: Internal Medicine | Admitting: Internal Medicine

## 2020-01-30 DIAGNOSIS — B974 Respiratory syncytial virus as the cause of diseases classified elsewhere: Secondary | ICD-10-CM

## 2020-01-30 DIAGNOSIS — R778 Other specified abnormalities of plasma proteins: Secondary | ICD-10-CM | POA: Diagnosis not present

## 2020-01-30 DIAGNOSIS — B338 Other specified viral diseases: Secondary | ICD-10-CM

## 2020-01-30 DIAGNOSIS — J9621 Acute and chronic respiratory failure with hypoxia: Secondary | ICD-10-CM | POA: Diagnosis not present

## 2020-01-30 DIAGNOSIS — I10 Essential (primary) hypertension: Secondary | ICD-10-CM | POA: Diagnosis not present

## 2020-01-30 DIAGNOSIS — J441 Chronic obstructive pulmonary disease with (acute) exacerbation: Secondary | ICD-10-CM | POA: Diagnosis not present

## 2020-01-30 LAB — COMPREHENSIVE METABOLIC PANEL
ALT: 19 U/L (ref 0–44)
AST: 39 U/L (ref 15–41)
Albumin: 3.1 g/dL — ABNORMAL LOW (ref 3.5–5.0)
Alkaline Phosphatase: 102 U/L (ref 38–126)
Anion gap: 13 (ref 5–15)
BUN: 22 mg/dL (ref 8–23)
CO2: 22 mmol/L (ref 22–32)
Calcium: 8.3 mg/dL — ABNORMAL LOW (ref 8.9–10.3)
Chloride: 101 mmol/L (ref 98–111)
Creatinine, Ser: 0.94 mg/dL (ref 0.44–1.00)
GFR calc Af Amer: >60 mL/min (ref >60)
GFR calc non Af Amer: >60 mL/min (ref >60)
Glucose, Bld: 191 mg/dL — ABNORMAL HIGH (ref 70–99)
Potassium: 4 mmol/L (ref 3.5–5.1)
Sodium: 136 mmol/L (ref 135–145)
Total Bilirubin: 0.7 mg/dL (ref 0.3–1.2)
Total Protein: 6.9 g/dL (ref 6.5–8.1)

## 2020-01-30 LAB — RESPIRATORY PANEL BY PCR

## 2020-01-30 LAB — ECHOCARDIOGRAM COMPLETE
AR max vel: 2.89 cm2
AV Area VTI: 3.45 cm2
AV Area mean vel: 3.09 cm2
AV Mean grad: 4 mmHg
AV Peak grad: 6.2 mmHg
Ao pk vel: 1.24 m/s
Area-P 1/2: 2.93 cm2
Height: 62 in
S' Lateral: 2.08 cm
Weight: 2903.02 oz

## 2020-01-30 LAB — CBC WITH DIFFERENTIAL/PLATELET
Abs Immature Granulocytes: 0.09 10*3/uL — ABNORMAL HIGH (ref 0.00–0.07)
Basophils Absolute: 0 10*3/uL (ref 0.0–0.1)
Basophils Relative: 0 %
Eosinophils Absolute: 0 10*3/uL (ref 0.0–0.5)
Eosinophils Relative: 0 %
HCT: 36.9 % (ref 36.0–46.0)
Hemoglobin: 12.1 g/dL (ref 12.0–15.0)
Immature Granulocytes: 1 %
Lymphocytes Relative: 19 %
Lymphs Abs: 2.3 10*3/uL (ref 0.7–4.0)
MCH: 30.1 pg (ref 26.0–34.0)
MCHC: 32.8 g/dL (ref 30.0–36.0)
MCV: 91.8 fL (ref 80.0–100.0)
Monocytes Absolute: 0.3 10*3/uL (ref 0.1–1.0)
Monocytes Relative: 2 %
Neutro Abs: 9.6 10*3/uL — ABNORMAL HIGH (ref 1.7–7.7)
Neutrophils Relative %: 78 %
Platelets: 220 10*3/uL (ref 150–400)
RBC: 4.02 MIL/uL (ref 3.87–5.11)
RDW: 14.8 % (ref 11.5–15.5)
WBC: 12.3 10*3/uL — ABNORMAL HIGH (ref 4.0–10.5)
nRBC: 0 % (ref 0.0–0.2)

## 2020-01-30 LAB — PHOSPHORUS: Phosphorus: 2.8 mg/dL (ref 2.5–4.6)

## 2020-01-30 LAB — MAGNESIUM: Magnesium: 2.5 mg/dL — ABNORMAL HIGH (ref 1.7–2.4)

## 2020-01-30 LAB — GLUCOSE, CAPILLARY: Glucose-Capillary: 203 mg/dL — ABNORMAL HIGH (ref 70–99)

## 2020-01-30 LAB — HIV ANTIBODY (ROUTINE TESTING W REFLEX): HIV Screen 4th Generation wRfx: NONREACTIVE

## 2020-01-30 LAB — MRSA PCR SCREENING: MRSA by PCR: NEGATIVE

## 2020-01-30 LAB — TSH: TSH: 0.286 u[IU]/mL — ABNORMAL LOW (ref 0.350–4.500)

## 2020-01-30 LAB — TROPONIN I (HIGH SENSITIVITY)
Troponin I (High Sensitivity): 139 ng/L (ref <18)
Troponin I (High Sensitivity): 96 ng/L — ABNORMAL HIGH (ref <18)

## 2020-01-30 MED ORDER — CHLORHEXIDINE GLUCONATE 0.12 % MT SOLN
15.0000 mL | Freq: Two times a day (BID) | OROMUCOSAL | Status: DC
  Administered 2020-01-30 – 2020-02-08 (×17): 15 mL via OROMUCOSAL
  Filled 2020-01-30 (×15): qty 15

## 2020-01-30 MED ORDER — ALBUTEROL SULFATE (2.5 MG/3ML) 0.083% IN NEBU
2.5000 mg | INHALATION_SOLUTION | RESPIRATORY_TRACT | Status: DC | PRN
  Administered 2020-02-02: 2.5 mg via RESPIRATORY_TRACT
  Filled 2020-01-30 (×2): qty 3

## 2020-01-30 MED ORDER — SODIUM CHLORIDE 0.9% FLUSH
3.0000 mL | Freq: Two times a day (BID) | INTRAVENOUS | Status: DC
  Administered 2020-01-30 – 2020-02-07 (×16): 3 mL via INTRAVENOUS

## 2020-01-30 MED ORDER — ONDANSETRON HCL 4 MG/2ML IJ SOLN: 4.0000 mg | Freq: Four times a day (QID) | INTRAMUSCULAR | Status: DC | PRN

## 2020-01-30 MED ORDER — IPRATROPIUM-ALBUTEROL 0.5-2.5 (3) MG/3ML IN SOLN
3.0000 mL | Freq: Four times a day (QID) | RESPIRATORY_TRACT | Status: DC
  Administered 2020-01-30 – 2020-01-31 (×6): 3 mL via RESPIRATORY_TRACT
  Filled 2020-01-30 (×6): qty 3

## 2020-01-30 MED ORDER — OXYCODONE HCL 5 MG PO TABS
5.0000 mg | ORAL_TABLET | Freq: Four times a day (QID) | ORAL | Status: DC | PRN
  Administered 2020-01-30 – 2020-02-08 (×13): 5 mg via ORAL
  Filled 2020-01-30 (×13): qty 1

## 2020-01-30 MED ORDER — LORATADINE 10 MG PO TABS
10.0000 mg | ORAL_TABLET | Freq: Every day | ORAL | Status: DC
  Administered 2020-01-30 – 2020-02-08 (×10): 10 mg via ORAL
  Filled 2020-01-30 (×11): qty 1

## 2020-01-30 MED ORDER — PREDNISONE 10 MG PO TABS
40.0000 mg | ORAL_TABLET | Freq: Every day | ORAL | Status: DC
  Administered 2020-01-31: 40 mg via ORAL
  Filled 2020-01-30 (×2): qty 4

## 2020-01-30 MED ORDER — ALPRAZOLAM 0.25 MG PO TABS
0.2500 mg | ORAL_TABLET | Freq: Three times a day (TID) | ORAL | Status: DC | PRN
  Administered 2020-01-30 – 2020-02-06 (×14): 0.25 mg via ORAL
  Filled 2020-01-30 (×15): qty 1

## 2020-01-30 MED ORDER — BUPRENORPHINE HCL 2 MG SL SUBL
4.0000 mg | SUBLINGUAL_TABLET | Freq: Two times a day (BID) | SUBLINGUAL | Status: DC
  Administered 2020-01-30 – 2020-01-31 (×2): 4 mg via SUBLINGUAL
  Filled 2020-01-30 (×4): qty 2

## 2020-01-30 MED ORDER — MOMETASONE FURO-FORMOTEROL FUM 200-5 MCG/ACT IN AERO
1.0000 | INHALATION_SPRAY | Freq: Two times a day (BID) | RESPIRATORY_TRACT | Status: DC
  Administered 2020-01-30 – 2020-02-08 (×17): 1 via RESPIRATORY_TRACT
  Filled 2020-01-30: qty 8.8

## 2020-01-30 MED ORDER — METHYLPREDNISOLONE SODIUM SUCC 125 MG IJ SOLR
60.0000 mg | Freq: Two times a day (BID) | INTRAMUSCULAR | Status: AC
  Administered 2020-01-30 (×2): 60 mg via INTRAVENOUS
  Filled 2020-01-30 (×2): qty 2

## 2020-01-30 MED ORDER — CLOPIDOGREL BISULFATE 75 MG PO TABS
75.0000 mg | ORAL_TABLET | Freq: Every day | ORAL | Status: DC
  Administered 2020-01-30 – 2020-02-08 (×10): 75 mg via ORAL
  Filled 2020-01-30 (×10): qty 1

## 2020-01-30 MED ORDER — DIPHENHYDRAMINE HCL 50 MG PO CAPS
50.0000 mg | ORAL_CAPSULE | Freq: Every evening | ORAL | Status: DC | PRN
  Filled 2020-01-30: qty 1

## 2020-01-30 MED ORDER — MOMETASONE FURO-FORMOTEROL FUM 200-5 MCG/ACT IN AERO: 2.0000 | INHALATION_SPRAY | Freq: Two times a day (BID) | RESPIRATORY_TRACT | Status: DC

## 2020-01-30 MED ORDER — CHLORHEXIDINE GLUCONATE CLOTH 2 % EX PADS
6.0000 | MEDICATED_PAD | Freq: Every day | CUTANEOUS | Status: DC
  Administered 2020-01-30 – 2020-02-08 (×5): 6 via TOPICAL

## 2020-01-30 MED ORDER — ONDANSETRON HCL 4 MG PO TABS: 4.0000 mg | ORAL_TABLET | Freq: Four times a day (QID) | ORAL | Status: DC | PRN

## 2020-01-30 MED ORDER — SODIUM CHLORIDE 0.9 % IV SOLN
500.0000 mg | INTRAVENOUS | Status: AC
  Administered 2020-01-30: 500 mg via INTRAVENOUS
  Filled 2020-01-30: qty 500

## 2020-01-30 MED ORDER — DOCUSATE SODIUM 100 MG PO CAPS
100.0000 mg | ORAL_CAPSULE | Freq: Two times a day (BID) | ORAL | Status: DC
  Administered 2020-01-30 – 2020-02-08 (×14): 100 mg via ORAL
  Filled 2020-01-30 (×15): qty 1

## 2020-01-30 MED ORDER — ORAL CARE MOUTH RINSE
15.0000 mL | Freq: Two times a day (BID) | OROMUCOSAL | Status: DC
  Administered 2020-01-30 – 2020-02-08 (×14): 15 mL via OROMUCOSAL

## 2020-01-30 MED ORDER — SODIUM CHLORIDE 0.9 % IV SOLN: INTRAVENOUS | Status: AC

## 2020-01-30 MED ORDER — ASPIRIN EC 81 MG PO TBEC
81.0000 mg | DELAYED_RELEASE_TABLET | Freq: Every day | ORAL | Status: DC
  Administered 2020-01-30 – 2020-02-08 (×10): 81 mg via ORAL
  Filled 2020-01-30 (×11): qty 1

## 2020-01-30 MED ORDER — AZITHROMYCIN 500 MG PO TABS
500.0000 mg | ORAL_TABLET | Freq: Every evening | ORAL | Status: AC
  Administered 2020-01-31 – 2020-02-03 (×4): 500 mg via ORAL
  Filled 2020-01-30 (×4): qty 1

## 2020-01-30 MED ORDER — SODIUM CHLORIDE 0.9% FLUSH
3.0000 mL | Freq: Two times a day (BID) | INTRAVENOUS | Status: DC
  Administered 2020-01-30 – 2020-02-08 (×19): 3 mL via INTRAVENOUS

## 2020-01-30 MED ORDER — NICOTINE 21 MG/24HR TD PT24
21.0000 mg | MEDICATED_PATCH | Freq: Every day | TRANSDERMAL | Status: DC
  Administered 2020-01-30 – 2020-02-07 (×10): 21 mg via TRANSDERMAL
  Filled 2020-01-30 (×9): qty 1

## 2020-01-30 NOTE — Progress Notes (Signed)
Precautions discontinued for RSV per recommendation of  Infectious Disease RN Charlynne Pander. Standard precautions in place at this time.

## 2020-01-30 NOTE — Progress Notes (Addendum)
PROGRESS NOTE    ANGELIKI MATES  PTW:[id number] DOB: 30-Oct-1960 DOA: 01/29/2020 PCP: [name], PA-C   Brief Narrative: Christy Mason is a 59 y.o. female with a history of COPD, GERD, hypertension, hyperthyroidism, tobacco use. Patient presented secondary to respiratory distress and found to have a COPD exacerbation secondary to RSV infection. Patient started on oxygen therapy in addition to Duoneb treatments and steroids.   Assessment & Plan:   Principal Problem:   COPD with acute exacerbation (HCC) Active Problems:   Essential hypertension   Elevated troponin   Acute on chronic respiratory failure with hypoxia (HCC)   RSV infection   COPD exacerbation Appears to be secondary to RSV infection. Patient reports that her sister also had RSV about one week ago. Patient initially required BiPAP secondary to hypoxia and tachypnea. She is improving slowly but not close to baseline. -Continue Oxygen supplementation, now with High flow [place] per RT -Continue Prednisone 40 mg daily burst -Continue Duoneb scheduled in addition to prn  Acute respiratory failure with hypoxia Secondary to above. -Oxygen therapy as above  RSV infection -Supportive care  Essential hypertension Patient is on losartan-hydrochlorothiazide as an outpatient which have been held. BP is controlled -Restart home regimen as needed  Elevated troponin Likely demand ischemia. Transthoracic Echocardiogram ordered and pending.  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 Course complicated by wound dehiscence. -Continue Plavix  Tobacco use -Continue Nicotine patch  DVT prophylaxis: SCDs Code Status:   Code Status: Full Code Family Communication: None at bedside Disposition Plan: Keep in SDU until work of breathing and oxygen requirement improved. Anticipate discharge home when clinically improved in several days. Will obtain PT/OT evaluation when clinical picture improved.   Consultants:    None  Procedures:   BiPAP  Antimicrobials:  Azithromycin    Subjective: Cough this morning. Dyspnea is improved but not close to baseline. Mild headache from yesterday is persistent today.  Objective: Vitals:   01/30/20 0600 01/30/20 0700 01/30/20 0800 01/30/20 0811  BP: 91/66 98/67 125/84   Pulse: 88 87 96   Resp: (!) 24 (!) 25 (!) 21   Temp:  98 F (36.7 C)    TempSrc:  Axillary    SpO2: 97% 96% 97% 97%  Weight:      Height:       No intake or output data in the 24 hours ending 01/30/20 0921 Filed Weights   01/29/20 1524 01/30/20 0007  Weight: 85.3 kg 82.3 kg    Examination:  General exam: Appears calm and comfortable Respiratory system: Diminished breath sounds bilaterally with decreased air movement and moderate wheezing. Tachypnea and accessory muscle usage noted. Cardiovascular system: S1 & S2 heard, Tachycardia, normal rhythm. No murmurs, rubs, gallops or clicks. Gastrointestinal system: Abdomen is nondistended, soft and nontender. No organomegaly or masses felt. Normal bowel sounds heard. Central nervous system: Alert and oriented. No focal neurological deficits. Musculoskeletal: No edema. No calf tenderness Skin: No cyanosis. No rashes Psychiatry: Judgement and insight appear normal. Mood & affect appropriate.     Data Reviewed: I have personally reviewed following labs and imaging studies  CBC Lab Results  Component Value Date   WBC 12.3 (H) 01/30/2020   RBC 4.02 01/30/2020   HGB 12.1 01/30/2020   HCT 36.9 01/30/2020   MCV 91.8 01/30/2020   MCH 30.1 01/30/2020   PLT 220 01/30/2020   MCHC 32.8 01/30/2020   RDW 14.8 01/30/2020   LYMPHSABS 2.3 01/30/2020   MONOABS 0.3 01/30/2020  EOSABS 0.0 01/30/2020   BASOSABS 0.0 01/30/2020     Last metabolic panel Lab Results  Component Value Date   NA 136 01/30/2020   K 4.0 01/30/2020   CL 101 01/30/2020   CO2 22 01/30/2020   BUN 22 01/30/2020   CREATININE 0.94 01/30/2020   GLUCOSE 191 (H)  01/30/2020   GFRNONAA >60 01/30/2020   GFRAA >60 01/30/2020   CALCIUM 8.3 (L) 01/30/2020   PHOS 2.8 01/30/2020   PROT 6.9 01/30/2020   ALBUMIN 3.1 (L) 01/30/2020   LABGLOB 3.2 10/08/2019   AGRATIO 1.1 (L) 10/08/2019   BILITOT 0.7 01/30/2020   ALKPHOS 102 01/30/2020   AST 39 01/30/2020   ALT 19 01/30/2020   ANIONGAP 13 01/30/2020    CBG (last 3)  Recent Labs    01/30/20 0010  GLUCAP 203*     GFR: Estimated Creatinine Clearance: 62.5 mL/min (by C-G formula based on SCr of 0.94 mg/dL).  Coagulation Profile: No results for input(s): INR, PROTIME in the last 168 hours.  Recent Results (from the past 240 hour(s))  SARS Coronavirus 2 by RT PCR (hospital order, [facility]) Nasopharyngeal Nasopharyngeal Swab     Status: None   Collection Time: 01/29/20  3:28 PM   Specimen: Nasopharyngeal Swab  Result Value Ref Range Status   SARS Coronavirus 2 NEGATIVE NEGATIVE Final    Comment: (NOTE) SARS-CoV-2 target nucleic acids are NOT DETECTED.  The SARS-CoV-2 RNA is generally detectable in upper and lower respiratory specimens during the acute phase of infection. The lowest concentration of SARS-CoV-2 viral copies this assay can detect is 250 copies / mL. A negative result does not preclude SARS-CoV-2 infection and should not be used as the sole basis for treatment or other patient management decisions.  A negative result may occur with improper specimen collection / handling, submission of specimen other than nasopharyngeal swab, presence of viral mutation(s) within the areas targeted by this assay, and inadequate number of viral copies (<250 copies / mL). A negative result must be combined with clinical observations, patient history, and epidemiological information.  Fact Sheet for Patients:   BoilerBrush.com.cyhttps://www.fda.gov/media/136312/download  Fact Sheet for Healthcare Providers: https://pope.com/https://www.fda.gov/media/136313/download  This test is not yet approved or   cleared by the [place] FDA and has been authorized for detection and/or diagnosis of SARS-CoV-2 by FDA under an Emergency Use Authorization (EUA).  This EUA will remain in effect (meaning this test can be used) for the duration of the COVID-19 declaration under Section 564(b)(1) of the Act, 21 U.S.C. section 360bbb-3(b)(1), unless the authorization is terminated or revoked sooner.  Performed at [facility], [address] [place], [place] [address]   Respiratory Panel by PCR     Status: Abnormal   Collection Time: 01/30/20 12:12 AM   Specimen: Nasopharyngeal Swab; Respiratory  Result Value Ref Range Status   Adenovirus NOT DETECTED NOT DETECTED Final   Coronavirus 229E NOT DETECTED NOT DETECTED Final    Comment: (NOTE) The Coronavirus on the Respiratory Panel, DOES NOT test for the novel  Coronavirus (2019 nCoV)    Coronavirus HKU1 NOT DETECTED NOT DETECTED Final   Coronavirus NL63 NOT DETECTED NOT DETECTED Final   Coronavirus OC43 NOT DETECTED NOT DETECTED Final   Metapneumovirus NOT DETECTED NOT DETECTED Final   Rhinovirus / Enterovirus NOT DETECTED NOT DETECTED Final   Influenza A NOT DETECTED NOT DETECTED Final   Influenza B NOT DETECTED NOT DETECTED Final   Parainfluenza Virus 1 NOT DETECTED  NOT DETECTED Final   Parainfluenza Virus 2 NOT DETECTED NOT DETECTED Final   Parainfluenza Virus 3 NOT DETECTED NOT DETECTED Final   Parainfluenza Virus 4 NOT DETECTED NOT DETECTED Final   Respiratory Syncytial Virus DETECTED (A) NOT DETECTED Final   Bordetella pertussis NOT DETECTED NOT DETECTED Final   Chlamydophila pneumoniae NOT DETECTED NOT DETECTED Final   Mycoplasma pneumoniae NOT DETECTED NOT DETECTED Final    Comment: Performed at [facility], [address], [place], [place] [address]  MRSA PCR Screening     Status: None   Collection Time: 01/30/20 12:12 AM   Specimen: Nasopharyngeal  Result Value Ref Range Status   MRSA by PCR NEGATIVE NEGATIVE  Final    Comment:        The GeneXpert MRSA Assay (FDA approved for NASAL specimens only), is one component of a comprehensive MRSA colonization surveillance program. It is not intended to diagnose MRSA infection nor to guide or monitor treatment for MRSA infections. Performed at [facility], [address], [place], [place] [address]         Radiology Studies: CT Angio Chest PE W and/or Wo Contrast  Result Date: 01/29/2020 CLINICAL DATA:  Dyspnea, hypoxia, COPD EXAM: CT ANGIOGRAPHY CHEST WITH CONTRAST TECHNIQUE: Multidetector CT imaging of the chest was performed using the standard protocol during bolus administration of intravenous contrast. Multiplanar CT image reconstructions and MIPs were obtained to evaluate the vascular anatomy. CONTRAST:  21mL OMNIPAQUE IOHEXOL 350 MG/ML SOLN COMPARISON:  05/31/2019 FINDINGS: Cardiovascular: There is adequate opacification of the pulmonary arterial tree. No intraluminal filling defect is seen to 6 chest acute pulmonary embolism. The central pulmonary arteries are of normal caliber. No significant coronary artery calcification. Mild left ventricular hypertrophy. Global cardiac size is within normal limits. No pericardial effusion. The thoracic aorta is suboptimally opacified, but appears unremarkable. There is suggestion of a dissection flap within the brachiocephalic artery arising at its origin. The left subclavian artery has been stented from its origin through the left axillary artery. Patency of the stented segment is not well assessed on this examination. Mediastinum/Nodes: No pathologic thoracic adenopathy. Lungs/Pleura: The lungs are symmetrically hyperinflated in keeping with changes of underlying COPD. This appears stable since prior examination. Densely calcified granuloma is seen within the basilar right middle lobe. The lungs are otherwise clear. No pneumothorax or pleural effusion. Central airways are widely patent. Upper Abdomen:  No acute abnormality. Musculoskeletal: Remote fracture deformity of the mid diaphysis of the right clavicle. ORIF of the right third, fourth, and fifth ribs with instrumentation noted antral laterally. Multiple healed additional right rib fractures noted. Healed fracture of the left fourth rib posteriorly noted. Severe anterior wedge compression fracture of T5 is unchanged. Similarly, mild compression fracture of T9 is also unchanged. No acute bone abnormality. Review of the MIP images confirms the above findings. IMPRESSION: No acute pulmonary embolism. Mild left ventricular hypertrophy. COPD with stable pulmonary hyperinflation. 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 Patency of the stented segment is not well assessed on this examination. Electronically Signed   By: Helyn Numbers MD   On: 01/29/2020 17:35   DG Chest Portable 1 View  Result Date: 01/29/2020 CLINICAL DATA:  Shortness of breath EXAM: PORTABLE CHEST 1 VIEW COMPARISON:  01/02/2020, CT 05/31/2019 FINDINGS: Right subclavian and axillary stent. Surgical plate and fixating screws along the right ribs. Hyperinflation with emphysematous disease and bronchitic change. No consolidation, pleural effusion,  or pneumothorax. Stable cardiomediastinal silhouette. IMPRESSION: No active cardiopulmonary disease. Hyperinflation with emphysematous disease and bronchitic changes. Electronically Signed   By: Jasmine Pang M.D.   On: 01/29/2020 16:02        Scheduled Meds: . aspirin EC  81 mg Oral Daily  . [START ON 01/31/2020] azithromycin  500 mg Oral QPM  . chlorhexidine  15 mL Mouth Rinse BID  . Chlorhexidine Gluconate Cloth  6 each Topical Daily  . clopidogrel  75 mg Oral Daily  . docusate sodium  100 mg Oral BID  . ipratropium-albuterol  3 mL Nebulization Q6H  . loratadine  10 mg Oral Daily  . mouth rinse  15 mL Mouth Rinse q12n4p  . methylPREDNISolone (SOLU-MEDROL)  injection  60 mg Intravenous Q12H   Followed by  . [START ON 01/31/2020] predniSONE  40 mg Oral Q breakfast  . mometasone-formoterol  1 puff Inhalation BID  . nicotine  21 mg Transdermal Daily  . sodium chloride flush  3 mL Intravenous Q12H  . sodium chloride flush  3 mL Intravenous Q12H   Continuous Infusions: . sodium chloride 75 mL/hr at 01/30/20 0108     LOS: 1 day     Jacquelin Hawking, MD [facility] 01/30/2020, 9:21 AM  If 7PM-7AM, please contact night-coverage [email or web address]

## 2020-01-30 NOTE — Plan of Care (Signed)
Pt progresses from BIpap to HF @ 40%; tolerating well.

## 2020-01-30 NOTE — Progress Notes (Signed)
OT Cancellation Note  Patient Details Name: Christy Mason MRN: [id number] DOB: Mar 02, 1961   Cancelled Treatment:    Reason Eval/Treat Not Completed: Patient not medically ready. Consult received, chart reviewed. Per RN, request therapy attempts be deferred until next date as pt's breathing remains fragile and RN wants to minimize risk of going back on BiPAP. Will re-attempt at later date/time as pt is medically appropriate.   Richrd Prime, MPH, MS, OTR/L ascom [phone] 01/30/20, 2:26 PM

## 2020-01-30 NOTE — Progress Notes (Signed)
Pt received from ED to CCU # 19 via stretcher. She A & O x 4; labor breathing & tachypneic. She c/o anxiety and denies pain. Will continue to monitor pt closely.

## 2020-01-30 NOTE — Progress Notes (Signed)
PT Cancellation Note  Patient Details Name: ZAMORIA BOSS MRN: [id number] DOB: Apr 05, 1961   Cancelled Treatment:    Reason Eval/Treat Not Completed: Medical issues which prohibited therapy> RN states patient is too fragile with breathing today. Does not want her to have to go back on bipap. Will hold for today and re-attempt tomorrow.   Shaleen Talamantez 01/30/2020, 2:19 PM
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
## 2020-01-30 NOTE — Progress Notes (Signed)
*  PRELIMINARY RESULTS* Echocardiogram 2D Echocardiogram has been performed.  Cristela Blue 01/30/2020, 1:33 PM

## 2020-01-31 ENCOUNTER — Other Ambulatory Visit: Payer: Self-pay

## 2020-01-31 ENCOUNTER — Encounter: Payer: Self-pay | Admitting: Internal Medicine

## 2020-01-31 DIAGNOSIS — R778 Other specified abnormalities of plasma proteins: Secondary | ICD-10-CM | POA: Diagnosis not present

## 2020-01-31 DIAGNOSIS — J9621 Acute and chronic respiratory failure with hypoxia: Secondary | ICD-10-CM | POA: Diagnosis not present

## 2020-01-31 DIAGNOSIS — J441 Chronic obstructive pulmonary disease with (acute) exacerbation: Secondary | ICD-10-CM | POA: Diagnosis not present

## 2020-01-31 DIAGNOSIS — I10 Essential (primary) hypertension: Secondary | ICD-10-CM | POA: Diagnosis not present

## 2020-01-31 MED ORDER — BUPRENORPHINE HCL-NALOXONE HCL 8-2 MG SL SUBL
1.0000 | SUBLINGUAL_TABLET | Freq: Two times a day (BID) | SUBLINGUAL | Status: DC
  Administered 2020-02-01 – 2020-02-04 (×4): 1 via SUBLINGUAL
  Filled 2020-01-31 (×8): qty 1

## 2020-01-31 MED ORDER — HYDROCOD POLST-CPM POLST ER 10-8 MG/5ML PO SUER
5.0000 mL | Freq: Two times a day (BID) | ORAL | Status: DC | PRN
  Administered 2020-01-31 – 2020-02-01 (×3): 5 mL via ORAL
  Filled 2020-01-31 (×3): qty 5

## 2020-01-31 MED ORDER — DEXTROMETHORPHAN POLISTIREX ER 30 MG/5ML PO SUER
30.0000 mg | Freq: Two times a day (BID) | ORAL | Status: DC
  Administered 2020-01-31 – 2020-02-01 (×4): 30 mg via ORAL
  Filled 2020-01-31 (×7): qty 5

## 2020-01-31 MED ORDER — BUPRENORPHINE HCL-NALOXONE HCL 2-0.5 MG SL SUBL
2.0000 | SUBLINGUAL_TABLET | Freq: Two times a day (BID) | SUBLINGUAL | Status: DC
  Administered 2020-01-31 – 2020-02-08 (×12): 2 via SUBLINGUAL
  Filled 2020-01-31 (×4): qty 2
  Filled 2020-01-31: qty 1
  Filled 2020-01-31 (×5): qty 2
  Filled 2020-01-31: qty 1
  Filled 2020-01-31 (×6): qty 2

## 2020-01-31 MED ORDER — SILVER SULFADIAZINE 1 % EX CREA
TOPICAL_CREAM | Freq: Every day | CUTANEOUS | Status: DC
  Filled 2020-01-31 (×2): qty 85

## 2020-01-31 MED ORDER — IPRATROPIUM-ALBUTEROL 0.5-2.5 (3) MG/3ML IN SOLN: 3.0000 mL | Freq: Four times a day (QID) | RESPIRATORY_TRACT | Status: DC

## 2020-01-31 MED ORDER — IPRATROPIUM-ALBUTEROL 0.5-2.5 (3) MG/3ML IN SOLN
3.0000 mL | RESPIRATORY_TRACT | Status: DC
  Administered 2020-01-31 – 2020-02-01 (×7): 3 mL via RESPIRATORY_TRACT
  Filled 2020-01-31 (×7): qty 3

## 2020-01-31 MED ORDER — IPRATROPIUM-ALBUTEROL 0.5-2.5 (3) MG/3ML IN SOLN: 3.0000 mL | RESPIRATORY_TRACT | Status: DC

## 2020-01-31 NOTE — Progress Notes (Signed)
PROGRESS NOTE    Christy Mason  ZOX:[id number] DOB: [birth year] DOA: 01/29/2020 PCP: [name], PA-C   Brief Narrative: Christy Mason is a 59 y.o. female with a history of COPD, GERD, hypertension, hyperthyroidism, tobacco use. Patient presented secondary to respiratory distress and found to have a COPD exacerbation secondary to RSV infection. Patient started on oxygen therapy in addition to Duoneb treatments and steroids.   Assessment & Plan:   Principal Problem:   COPD with acute exacerbation (HCC) Active Problems:   Essential hypertension   Elevated troponin   Acute on chronic respiratory failure with hypoxia (HCC)   RSV infection   COPD exacerbation Appears to be secondary to RSV infection. Patient reports that her sister also had RSV about one week ago. Patient initially required BiPAP secondary to hypoxia and tachypnea and now tolerating HFNC. She is improving slowly but not close to baseline. -Continue Oxygen supplementation, now with High flow [place] per RRT. Weaning FiO2 and flow rate as able -Continue Prednisone 40 mg daily burst -Continue Duoneb scheduled and increase frequency to q4 hours while awake in addition to prn -Tussionex prn  Acute respiratory failure with hypoxia Secondary to above. -Oxygen therapy as above  RSV infection -Supportive care  Essential hypertension Patient is on losartan-hydrochlorothiazide as an outpatient which have been held. BP is controlled -Restart home regimen as needed  Elevated troponin Likely demand ischemia. Transthoracic Echocardiogram ordered and pending.  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 Course complicated by wound dehiscence. -Continue Plavix -Continue wound dressing  Tobacco use -Continue Nicotine patch  DVT prophylaxis: SCDs Code Status:   Code Status: Full Code Family Communication: None at bedside Disposition Plan: Keep in SDU until work of breathing and oxygen requirement  improved. Anticipate discharge home when clinically improved in several days. Will obtain PT/OT evaluation when clinical picture improved.   Consultants:   None  Procedures:   BiPAP  Antimicrobials:  Azithromycin    Subjective: Coughing overnight.  Objective: Vitals:   01/31/20 0600 01/31/20 0700 01/31/20 0800 01/31/20 0834  BP: 125/85 138/87 (!) 162/90   Pulse: 84 75 (!) 106 100  Resp: (!) 30 (!) 32 20 (!) 23  Temp:   98.7 F (37.1 C)   TempSrc:   Axillary   SpO2: 95% 96% 94% 95%  Weight:      Height:        Intake/Output Summary (Last 24 hours) at 01/31/2020 0906 Last data filed at 01/30/2020 1800 Gross per 24 hour  Intake 480 ml  Output 500 ml  Net -20 ml   Filed Weights   01/29/20 1524 01/30/20 0007  Weight: 85.3 kg 82.3 kg    Examination:  General exam: Appears calm and comfortable Respiratory system: Poor air movement with bilateral wheezing and increased respiratory effort with accessory muscle usage. Speaks in full sentences Cardiovascular system: S1 & S2 heard, RRR. No murmurs, rubs, gallops or clicks. Gastrointestinal system: Abdomen is nondistended, soft and nontender. No organomegaly or masses felt. Normal bowel sounds heard. Central nervous system: Alert and oriented. No focal neurological deficits. Musculoskeletal: No edema. No calf tenderness Skin: No cyanosis. No rashes Psychiatry: Judgement and insight appear normal. Mood & affect appropriate.     Data Reviewed: I have personally reviewed following labs and imaging studies  CBC Lab Results  Component Value Date   WBC 12.3 (H) 01/30/2020   RBC 4.02 01/30/2020   HGB 12.1 01/30/2020   HCT 36.9 01/30/2020   MCV 91.8 01/30/2020  MCH 30.1 01/30/2020   PLT 220 01/30/2020   MCHC 32.8 01/30/2020   RDW 14.8 01/30/2020   LYMPHSABS 2.3 01/30/2020   MONOABS 0.3 01/30/2020   EOSABS 0.0 01/30/2020   BASOSABS 0.0 01/30/2020     Last metabolic panel Lab Results  Component Value Date    NA 136 01/30/2020   K 4.0 01/30/2020   CL 101 01/30/2020   CO2 22 01/30/2020   BUN 22 01/30/2020   CREATININE 0.94 01/30/2020   GLUCOSE 191 (H) 01/30/2020   GFRNONAA >60 01/30/2020   GFRAA >60 01/30/2020   CALCIUM 8.3 (L) 01/30/2020   PHOS 2.8 01/30/2020   PROT 6.9 01/30/2020   ALBUMIN 3.1 (L) 01/30/2020   LABGLOB 3.2 10/08/2019   AGRATIO 1.1 (L) 10/08/2019   BILITOT 0.7 01/30/2020   ALKPHOS 102 01/30/2020   AST 39 01/30/2020   ALT 19 01/30/2020   ANIONGAP 13 01/30/2020    CBG (last 3)  Recent Labs    01/30/20 0010  GLUCAP 203*     GFR: Estimated Creatinine Clearance: 62.5 mL/min (by C-G formula based on SCr of 0.94 mg/dL).  Coagulation Profile: No results for input(s): INR, PROTIME in the last 168 hours.  Recent Results (from the past 240 hour(s))  SARS Coronavirus 2 by RT PCR (hospital order, performed in [facility] hospital lab) Nasopharyngeal Nasopharyngeal Swab     Status: None   Collection Time: 01/29/20  3:28 PM   Specimen: Nasopharyngeal Swab  Result Value Ref Range Status   SARS Coronavirus 2 NEGATIVE NEGATIVE Final    Comment: (NOTE) SARS-CoV-2 target nucleic acids are NOT DETECTED.  The SARS-CoV-2 RNA is generally detectable in upper and lower respiratory specimens during the acute phase of infection. The lowest concentration of SARS-CoV-2 viral copies this assay can detect is 250 copies / mL. A negative result does not preclude SARS-CoV-2 infection and should not be used as the sole basis for treatment or other patient management decisions.  A negative result may occur with improper specimen collection / handling, submission of specimen other than nasopharyngeal swab, presence of viral mutation(s) within the areas targeted by this assay, and inadequate number of viral copies (<250 copies / mL). A negative result must be combined with clinical observations, patient history, and epidemiological information.  Fact Sheet for Patients:    BoilerBrush.com.cy  Fact Sheet for Healthcare Providers: https://pope.com/  This test is not yet approved or  cleared by the [place] FDA and has been authorized for detection and/or diagnosis of SARS-CoV-2 by FDA under an Emergency Use Authorization (EUA).  This EUA will remain in effect (meaning this test can be used) for the duration of the COVID-19 declaration under Section 564(b)(1) of the Act, 21 U.S.C. section 360bbb-3(b)(1), unless the authorization is terminated or revoked sooner.  Performed at [facility], [address] [place], [place] [address]   Respiratory Panel by PCR     Status: Abnormal   Collection Time: 01/30/20 12:12 AM   Specimen: Nasopharyngeal Swab; Respiratory  Result Value Ref Range Status   Adenovirus NOT DETECTED NOT DETECTED Final   Coronavirus 229E NOT DETECTED NOT DETECTED Final    Comment: (NOTE) The Coronavirus on the Respiratory Panel, DOES NOT test for the novel  Coronavirus (2019 nCoV)    Coronavirus HKU1 NOT DETECTED NOT DETECTED Final   Coronavirus NL63 NOT DETECTED NOT DETECTED Final   Coronavirus OC43 NOT DETECTED NOT DETECTED Final   Metapneumovirus NOT DETECTED NOT DETECTED Final   Rhinovirus / Enterovirus  NOT DETECTED NOT DETECTED Final   Influenza A NOT DETECTED NOT DETECTED Final   Influenza B NOT DETECTED NOT DETECTED Final   Parainfluenza Virus 1 NOT DETECTED NOT DETECTED Final   Parainfluenza Virus 2 NOT DETECTED NOT DETECTED Final   Parainfluenza Virus 3 NOT DETECTED NOT DETECTED Final   Parainfluenza Virus 4 NOT DETECTED NOT DETECTED Final   Respiratory Syncytial Virus DETECTED (A) NOT DETECTED Final   Bordetella pertussis NOT DETECTED NOT DETECTED Final   Chlamydophila pneumoniae NOT DETECTED NOT DETECTED Final   Mycoplasma pneumoniae NOT DETECTED NOT DETECTED Final    Comment: Performed at [facility], [address] [place], [place] [address]  MRSA  PCR Screening     Status: None   Collection Time: 01/30/20 12:12 AM   Specimen: Nasopharyngeal  Result Value Ref Range Status   MRSA by PCR NEGATIVE NEGATIVE Final    Comment:        The GeneXpert MRSA Assay (FDA approved for NASAL specimens only), is one component of a comprehensive MRSA colonization surveillance program. It is not intended to diagnose MRSA infection nor to guide or monitor treatment for MRSA infections. Performed at [facility], [address] [place], [place] [address]         Radiology Studies: CT Angio Chest PE W and/or Wo Contrast  Result Date: 01/29/2020 CLINICAL DATA:  Dyspnea, hypoxia, COPD EXAM: CT ANGIOGRAPHY CHEST WITH CONTRAST TECHNIQUE: Multidetector CT imaging of the chest was performed using the standard protocol during bolus administration of intravenous contrast. Multiplanar CT image reconstructions and MIPs were obtained to evaluate the vascular anatomy. CONTRAST:  75mL OMNIPAQUE IOHEXOL 350 MG/ML SOLN COMPARISON:  05/31/2019 FINDINGS: Cardiovascular: There is adequate opacification of the pulmonary arterial tree. No intraluminal filling defect is seen to 6 chest acute pulmonary embolism. The central pulmonary arteries are of normal caliber. No significant coronary artery calcification. Mild left ventricular hypertrophy. Global cardiac size is within normal limits. No pericardial effusion. The thoracic aorta is suboptimally opacified, but appears unremarkable. There is suggestion of a dissection flap within the brachiocephalic artery arising at its origin. The left subclavian artery has been stented from its origin through the left axillary artery. Patency of the stented segment is not well assessed on this examination. Mediastinum/Nodes: No pathologic thoracic adenopathy. Lungs/Pleura: The lungs are symmetrically hyperinflated in keeping with changes of underlying COPD. This appears stable since prior examination. Densely calcified granuloma  is seen within the basilar right middle lobe. The lungs are otherwise clear. No pneumothorax or pleural effusion. Central airways are widely patent. Upper Abdomen: No acute abnormality. Musculoskeletal: Remote fracture deformity of the mid diaphysis of the right clavicle. ORIF of the right third, fourth, and fifth ribs with instrumentation noted antral laterally. Multiple healed additional right rib fractures noted. Healed fracture of the left fourth rib posteriorly noted. Severe anterior wedge compression fracture of T5 is unchanged. Similarly, mild compression fracture of T9 is also unchanged. No acute bone abnormality. Review of the MIP images confirms the above findings. IMPRESSION: No acute pulmonary embolism. Mild left ventricular hypertrophy. COPD with stable pulmonary hyperinflation. 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 Patency of the stented segment is not well assessed on this examination. Electronically Signed   By: Helyn NumbersAshesh  Parikh MD   On: 01/29/2020 17:35   DG Chest Portable 1 View  Result Date: 01/29/2020 CLINICAL DATA:  Shortness of breath EXAM: PORTABLE CHEST 1 VIEW COMPARISON:  01/02/2020, CT 05/31/2019 FINDINGS: Right subclavian and axillary stent. Surgical plate and fixating screws along the right ribs. Hyperinflation with emphysematous disease and bronchitic change. No consolidation, pleural effusion, or pneumothorax. Stable cardiomediastinal silhouette. IMPRESSION: No active cardiopulmonary disease. Hyperinflation with emphysematous disease and bronchitic changes. Electronically Signed   By: Jasmine Pang M.D.   On: 01/29/2020 16:02   ECHOCARDIOGRAM COMPLETE  Result Date: 01/30/2020    ECHOCARDIOGRAM REPORT   Patient Name:   DENEENE TARVER Date of Exam: 01/30/2020 Medical Rec #:  [id number]     Height:       62.0 in Accession #:    [id number]    Weight:       181.4 lb Date of Birth:  03-Jan-1961     BSA:           1.834 m Patient Age:    59 years      BP:           134/80 mmHg Patient Gender: F             HR:           107 bpm. Exam Location:  [facility] Procedure: 2D Echo, Cardiac Doppler and Color Doppler Indications:     Elevated troponin  History:         Patient has no prior history of Echocardiogram examinations.                  COPD; Risk Factors:Hypertension.  Sonographer:     Cristela Blue RDCS (AE) Referring Phys:  Kenn File DOUTOVA Diagnosing Phys: Arnoldo Hooker MD  Sonographer Comments: Image acquisition challenging due to COPD. IMPRESSIONS  1. Left ventricular ejection fraction, by estimation, is 65 to 70%. The left ventricle has normal function. The left ventricle has no regional wall motion abnormalities. Left ventricular diastolic parameters were normal.  2. Right ventricular systolic function is normal. The right ventricular size is normal. There is normal pulmonary artery systolic pressure.  3. The mitral valve is normal in structure. Trivial mitral valve regurgitation.  4. The aortic valve is normal in structure. Aortic valve regurgitation is not visualized. FINDINGS  Left Ventricle: Left ventricular ejection fraction, by estimation, is 65 to 70%. The left ventricle has normal function. The left ventricle has no regional wall motion abnormalities. The left ventricular internal cavity size was small. There is no left ventricular hypertrophy. Left ventricular diastolic parameters were normal. Right Ventricle: The right ventricular size is normal. No increase in right ventricular wall thickness. Right ventricular systolic function is normal. There is normal pulmonary artery systolic pressure. The tricuspid regurgitant velocity is 2.09 m/s, and  with an assumed right atrial pressure of 10 mmHg, the estimated right ventricular systolic pressure is 27.5 mmHg. Left Atrium: Left atrial size was normal in size. Right Atrium: Right atrial size was normal in size. Pericardium: There is no evidence of pericardial  effusion. Mitral Valve: The mitral valve is normal in structure. Trivial mitral valve regurgitation. Tricuspid Valve: The tricuspid valve is normal in structure. Tricuspid valve regurgitation is trivial. Aortic Valve: The aortic valve is normal in structure. Aortic valve regurgitation is not visualized. Aortic valve mean gradient measures 4.0 mmHg. Aortic valve peak gradient measures 6.2 mmHg. Aortic valve area, by VTI measures 3.45 cm. Pulmonic Valve: The pulmonic valve was normal in structure. Pulmonic valve regurgitation is not visualized. Aorta: The aortic root and ascending aorta are structurally normal, with no evidence of dilitation. IAS/Shunts: No atrial level shunt detected by color  flow Doppler.  LEFT VENTRICLE PLAX 2D LVIDd:         3.24 cm  Diastology LVIDs:         2.08 cm  LV e' lateral:   11.60 cm/s LV PW:         1.13 cm  LV E/e' lateral: 7.2 LV IVS:        0.94 cm  LV e' medial:    6.20 cm/s LVOT diam:     2.00 cm  LV E/e' medial:  13.5 LV SV:         61 LV SV Index:   33 LVOT Area:     3.14 cm  RIGHT VENTRICLE RV Basal diam:  3.43 cm RV S prime:     17.60 cm/s TAPSE (M-mode): 3.1 cm LEFT ATRIUM           Index       RIGHT ATRIUM          Index LA diam:      3.00 cm 1.64 cm/m  RA Area:     9.05 cm LA Vol (A2C): 25.3 ml 13.79 ml/m RA Volume:   18.50 ml 10.09 ml/m LA Vol (A4C): 31.8 ml 17.34 ml/m  AORTIC VALVE AV Area (Vmax):    2.89 cm AV Area (Vmean):   3.09 cm AV Area (VTI):     3.45 cm AV Vmax:           124.00 cm/s AV Vmean:          90.500 cm/s AV VTI:            0.176 m AV Peak Grad:      6.2 mmHg AV Mean Grad:      4.0 mmHg LVOT Vmax:         114.00 cm/s LVOT Vmean:        89.100 cm/s LVOT VTI:          0.193 m LVOT/AV VTI ratio: 1.10  AORTA Ao Root diam: 2.90 cm MITRAL VALVE                TRICUSPID VALVE MV Area (PHT): 2.93 cm     TR Peak grad:   17.5 mmHg MV Decel Time: 259 msec     TR Vmax:        209.00 cm/s MV E velocity: 84.00 cm/s MV A velocity: 118.00 cm/s  SHUNTS MV E/A  ratio:  0.71         Systemic VTI:  0.19 m                             Systemic Diam: 2.00 cm Arnoldo Hooker MD Electronically signed by Arnoldo Hooker MD Signature Date/Time: 01/30/2020/5:31:58 PM    Final         Scheduled Meds: . aspirin EC  81 mg Oral Daily  . azithromycin  500 mg Oral QPM  . buprenorphine-naloxone  2 tablet Sublingual BID   Or  . buprenorphine-naloxone  1 tablet Sublingual BID  . chlorhexidine  15 mL Mouth Rinse BID  . Chlorhexidine Gluconate Cloth  6 each Topical Daily  . clopidogrel  75 mg Oral Daily  . dextromethorphan  30 mg Oral BID  . docusate sodium  100 mg Oral BID  . ipratropium-albuterol  3 mL Nebulization Q6H  . loratadine  10 mg Oral Daily  . mouth rinse  15 mL Mouth Rinse q12n4p  . mometasone-formoterol  1 puff Inhalation BID  . nicotine  21 mg Transdermal Daily  . predniSONE  40 mg Oral Q breakfast  . silver sulfADIAZINE   Topical Daily  . sodium chloride flush  3 mL Intravenous Q12H  . sodium chloride flush  3 mL Intravenous Q12H   Continuous Infusions:    LOS: 2 days     Jacquelin Hawking, MD [facility] 01/31/2020, 9:06 AM  If 7PM-7AM, please contact night-coverage [email or web address]
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
## 2020-02-01 ENCOUNTER — Inpatient Hospital Stay: Payer: Medicaid Other

## 2020-02-01 DIAGNOSIS — R778 Other specified abnormalities of plasma proteins: Secondary | ICD-10-CM | POA: Diagnosis not present

## 2020-02-01 DIAGNOSIS — J441 Chronic obstructive pulmonary disease with (acute) exacerbation: Secondary | ICD-10-CM | POA: Diagnosis not present

## 2020-02-01 DIAGNOSIS — J9621 Acute and chronic respiratory failure with hypoxia: Secondary | ICD-10-CM | POA: Diagnosis not present

## 2020-02-01 DIAGNOSIS — I10 Essential (primary) hypertension: Secondary | ICD-10-CM | POA: Diagnosis not present

## 2020-02-01 MED ORDER — LABETALOL HCL 5 MG/ML IV SOLN
10.0000 mg | INTRAVENOUS | Status: DC | PRN
  Administered 2020-02-01 – 2020-02-02 (×4): 10 mg via INTRAVENOUS
  Filled 2020-02-01 (×4): qty 4

## 2020-02-01 MED ORDER — SILVER SULFADIAZINE 1 % EX CREA
TOPICAL_CREAM | Freq: Two times a day (BID) | CUTANEOUS | Status: DC
  Filled 2020-02-01: qty 85

## 2020-02-01 MED ORDER — METHYLPREDNISOLONE SODIUM SUCC 40 MG IJ SOLR
40.0000 mg | Freq: Two times a day (BID) | INTRAMUSCULAR | Status: DC
  Administered 2020-02-01 – 2020-02-06 (×11): 40 mg via INTRAVENOUS
  Filled 2020-02-01 (×10): qty 1

## 2020-02-01 MED ORDER — IPRATROPIUM-ALBUTEROL 0.5-2.5 (3) MG/3ML IN SOLN
3.0000 mL | Freq: Four times a day (QID) | RESPIRATORY_TRACT | Status: DC
  Administered 2020-02-01 – 2020-02-05 (×13): 3 mL via RESPIRATORY_TRACT
  Filled 2020-02-01 (×14): qty 3

## 2020-02-01 NOTE — Progress Notes (Signed)
PROGRESS NOTE    Christy Mason  NAT:[id number] DOB: [birth year] DOA: 01/29/2020 PCP: [name], PA-C   Brief Narrative: Christy Mason is a 58 y.o. female with a history of COPD, GERD, hypertension, hyperthyroidism, tobacco use. Patient presented secondary to respiratory distress and found to have a COPD exacerbation secondary to RSV infection. Patient started on oxygen therapy in addition to Duoneb treatments and steroids.   Assessment & Plan:   Principal Problem:   COPD with acute exacerbation (HCC) Active Problems:   Essential hypertension   Elevated troponin   Acute on chronic respiratory failure with hypoxia (HCC)   RSV infection   COPD exacerbation Appears to be secondary to RSV infection. Patient reports that her sister also had RSV about one week ago. Patient initially required BiPAP secondary to hypoxia and tachypnea and now tolerating HFNC. She is improving slowly but not close to baseline. -Continue Oxygen supplementation, now with High flow [place] per RRT. Weaning FiO2 and flow rate as able -Switch to Solu-medrol 40 mg BID -Continue Duoneb scheduled and increase frequency to q4 hours while awake in addition to prn -Tussionex prn -Chest x-ray -Pulmonology consult  Acute respiratory failure with hypoxia Secondary to above. -Oxygen therapy as above  RSV infection -Supportive care  Essential hypertension Patient is on losartan-hydrochlorothiazide as an outpatient which have been held. BP is mostly controlled -Restart home regimen as needed  Elevated troponin Likely demand ischemia. Transthoracic Echocardiogram significant for normal EF.  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 Course complicated by wound dehiscence. -Continue Plavix -Continue wound dressing  Tobacco use -Continue Nicotine patch  DVT prophylaxis: SCDs Code Status:   Code Status: Full Code Family Communication: None at bedside Disposition Plan: Keep in SDU until work  of breathing and oxygen requirement improved. Anticipate discharge home when clinically improved in several days. Will obtain PT/OT recommending SNF on discharge. TOC consulted.   Consultants:   None  Procedures:   BiPAP  Antimicrobials:  Azithromycin    Subjective: Cough significantly improved with Tussionex. Breathing is marginally better.  Objective: Vitals:   02/01/20 0400 02/01/20 0500 02/01/20 0600 02/01/20 0800  BP: (!) 134/70 (!) 162/78 119/72   Pulse: 95 101 91   Resp: (!) 24 (!) 27 22   Temp: 98 F (36.7 C)     TempSrc: Oral     SpO2: 96% 95% 97% 96%  Weight:      Height:        Intake/Output Summary (Last 24 hours) at 02/01/2020 0912 Last data filed at 02/01/2020 0600 Gross per 24 hour  Intake --  Output 1300 ml  Net -1300 ml   Filed Weights   01/29/20 1524 01/30/20 0007  Weight: 85.3 kg 82.3 kg    Examination:  General exam: Appears calm and comfortable Respiratory system: Poor air movement with significant wheezing. Increased respiratory effort with accessory muscle usage. Tachypnea is moderate. Cardiovascular system: S1 & S2 heard, RRR. No murmurs, rubs, gallops or clicks. Gastrointestinal system: Abdomen is nondistended, soft and nontender. No organomegaly or masses felt. Normal bowel sounds heard. Central nervous system: Alert and oriented. No focal neurological deficits. Musculoskeletal: No edema. No calf tenderness Skin: No cyanosis. No rashes Psychiatry: Judgement and insight appear normal. Mood & affect appropriate.     Data Reviewed: I have personally reviewed following labs and imaging studies  CBC Lab Results  Component Value Date   WBC 12.3 (H) 01/30/2020   RBC 4.02 01/30/2020   HGB 12.1 01/30/2020  HCT 36.9 01/30/2020   MCV 91.8 01/30/2020   MCH 30.1 01/30/2020   PLT 220 01/30/2020   MCHC 32.8 01/30/2020   RDW 14.8 01/30/2020   LYMPHSABS 2.3 01/30/2020   MONOABS 0.3 01/30/2020   EOSABS 0.0 01/30/2020   BASOSABS 0.0  01/30/2020     Last metabolic panel Lab Results  Component Value Date   NA 136 01/30/2020   K 4.0 01/30/2020   CL 101 01/30/2020   CO2 22 01/30/2020   BUN 22 01/30/2020   CREATININE 0.94 01/30/2020   GLUCOSE 191 (H) 01/30/2020   GFRNONAA >60 01/30/2020   GFRAA >60 01/30/2020   CALCIUM 8.3 (L) 01/30/2020   PHOS 2.8 01/30/2020   PROT 6.9 01/30/2020   ALBUMIN 3.1 (L) 01/30/2020   LABGLOB 3.2 10/08/2019   AGRATIO 1.1 (L) 10/08/2019   BILITOT 0.7 01/30/2020   ALKPHOS 102 01/30/2020   AST 39 01/30/2020   ALT 19 01/30/2020   ANIONGAP 13 01/30/2020    CBG (last 3)  Recent Labs    01/30/20 0010  GLUCAP 203*     GFR: Estimated Creatinine Clearance: 62.5 mL/min (by C-G formula based on SCr of 0.94 mg/dL).  Coagulation Profile: No results for input(s): INR, PROTIME in the last 168 hours.  Recent Results (from the past 240 hour(s))  SARS Coronavirus 2 by RT PCR (hospital order, [facility]) Nasopharyngeal Nasopharyngeal Swab     Status: None   Collection Time: 01/29/20  3:28 PM   Specimen: Nasopharyngeal Swab  Result Value Ref Range Status   SARS Coronavirus 2 NEGATIVE NEGATIVE Final    Comment: (NOTE) SARS-CoV-2 target nucleic acids are NOT DETECTED.  The SARS-CoV-2 RNA is generally detectable in upper and lower respiratory specimens during the acute phase of infection. The lowest concentration of SARS-CoV-2 viral copies this assay can detect is 250 copies / mL. A negative result does not preclude SARS-CoV-2 infection and should not be used as the sole basis for treatment or other patient management decisions.  A negative result may occur with improper specimen collection / handling, submission of specimen other than nasopharyngeal swab, presence of viral mutation(s) within the areas targeted by this assay, and inadequate number of viral copies (<250 copies / mL). A negative result must be combined with clinical observations, patient history,  and epidemiological information.  Fact Sheet for Patients:   BoilerBrush.com.cyhttps://www.fda.gov/media/136312/download  Fact Sheet for Healthcare Providers: https://pope.com/https://www.fda.gov/media/136313/download  This test is not yet approved or  cleared by the [place] FDA and has been authorized for detection and/or diagnosis of SARS-CoV-2 by FDA under an Emergency Use Authorization (EUA).  This EUA will remain in effect (meaning this test can be used) for the duration of the COVID-19 declaration under Section 564(b)(1) of the Act, 21 U.S.C. section 360bbb-3(b)(1), unless the authorization is terminated or revoked sooner.  Performed at [facility], [address] [place], [place] [address]   Respiratory Panel by PCR     Status: Abnormal   Collection Time: 01/30/20 12:12 AM   Specimen: Nasopharyngeal Swab; Respiratory  Result Value Ref Range Status   Adenovirus NOT DETECTED NOT DETECTED Final   Coronavirus 229E NOT DETECTED NOT DETECTED Final    Comment: (NOTE) The Coronavirus on the Respiratory Panel, DOES NOT test for the novel  Coronavirus (2019 nCoV)    Coronavirus HKU1 NOT DETECTED NOT DETECTED Final   Coronavirus NL63 NOT DETECTED NOT DETECTED Final   Coronavirus OC43 NOT DETECTED NOT DETECTED Final   Metapneumovirus  NOT DETECTED NOT DETECTED Final   Rhinovirus / Enterovirus NOT DETECTED NOT DETECTED Final   Influenza A NOT DETECTED NOT DETECTED Final   Influenza B NOT DETECTED NOT DETECTED Final   Parainfluenza Virus 1 NOT DETECTED NOT DETECTED Final   Parainfluenza Virus 2 NOT DETECTED NOT DETECTED Final   Parainfluenza Virus 3 NOT DETECTED NOT DETECTED Final   Parainfluenza Virus 4 NOT DETECTED NOT DETECTED Final   Respiratory Syncytial Virus DETECTED (A) NOT DETECTED Final   Bordetella pertussis NOT DETECTED NOT DETECTED Final   Chlamydophila pneumoniae NOT DETECTED NOT DETECTED Final   Mycoplasma pneumoniae NOT DETECTED NOT DETECTED Final    Comment: Performed at [facility], [address], [place], [place] [address]  MRSA PCR Screening     Status: None   Collection Time: 01/30/20 12:12 AM   Specimen: Nasopharyngeal  Result Value Ref Range Status   MRSA by PCR NEGATIVE NEGATIVE Final    Comment:        The GeneXpert MRSA Assay (FDA approved for NASAL specimens only), is one component of a comprehensive MRSA colonization surveillance program. It is not intended to diagnose MRSA infection nor to guide or monitor treatment for MRSA infections. Performed at [facility], [address], [place], [place] [address]         Radiology Studies: ECHOCARDIOGRAM COMPLETE  Result Date: 01/30/2020    ECHOCARDIOGRAM REPORT   Patient Name:   Christy Mason Date of Exam: 01/30/2020 Medical Rec #:  [id number]     Height:       62.0 in Accession #:    [id number]    Weight:       181.4 lb Date of Birth:  May 15, 1961     BSA:          1.834 m Patient Age:    59 years      BP:           134/80 mmHg Patient Gender: F             HR:           107 bpm. Exam Location:  [facility] Procedure: 2D Echo, Cardiac Doppler and Color Doppler Indications:     Elevated troponin  History:         Patient has no prior history of Echocardiogram examinations.                  COPD; Risk Factors:Hypertension.  Sonographer:     Cristela Blue RDCS (AE) Referring Phys:  Kenn File DOUTOVA Diagnosing Phys: Arnoldo Hooker MD  Sonographer Comments: Image acquisition challenging due to COPD. IMPRESSIONS  1. Left ventricular ejection fraction, by estimation, is 65 to 70%. The left ventricle has normal function. The left ventricle has no regional wall motion abnormalities. Left ventricular diastolic parameters were normal.  2. Right ventricular systolic function is normal. The right ventricular size is normal. There is normal pulmonary artery systolic pressure.  3. The mitral valve is normal in structure. Trivial mitral valve regurgitation.  4. The aortic valve is normal in structure. Aortic  valve regurgitation is not visualized. FINDINGS  Left Ventricle: Left ventricular ejection fraction, by estimation, is 65 to 70%. The left ventricle has normal function. The left ventricle has no regional wall motion abnormalities. The left ventricular internal cavity size was small. There is no left ventricular hypertrophy. Left ventricular diastolic parameters were normal. Right Ventricle: The right ventricular size is normal. No increase in right ventricular  wall thickness. Right ventricular systolic function is normal. There is normal pulmonary artery systolic pressure. The tricuspid regurgitant velocity is 2.09 m/s, and  with an assumed right atrial pressure of 10 mmHg, the estimated right ventricular systolic pressure is 27.5 mmHg. Left Atrium: Left atrial size was normal in size. Right Atrium: Right atrial size was normal in size. Pericardium: There is no evidence of pericardial effusion. Mitral Valve: The mitral valve is normal in structure. Trivial mitral valve regurgitation. Tricuspid Valve: The tricuspid valve is normal in structure. Tricuspid valve regurgitation is trivial. Aortic Valve: The aortic valve is normal in structure. Aortic valve regurgitation is not visualized. Aortic valve mean gradient measures 4.0 mmHg. Aortic valve peak gradient measures 6.2 mmHg. Aortic valve area, by VTI measures 3.45 cm. Pulmonic Valve: The pulmonic valve was normal in structure. Pulmonic valve regurgitation is not visualized. Aorta: The aortic root and ascending aorta are structurally normal, with no evidence of dilitation. IAS/Shunts: No atrial level shunt detected by color flow Doppler.  LEFT VENTRICLE PLAX 2D LVIDd:         3.24 cm  Diastology LVIDs:         2.08 cm  LV e' lateral:   11.60 cm/s LV PW:         1.13 cm  LV E/e' lateral: 7.2 LV IVS:        0.94 cm  LV e' medial:    6.20 cm/s LVOT diam:     2.00 cm  LV E/e' medial:  13.5 LV SV:         61 LV SV Index:   33 LVOT Area:     3.14 cm  RIGHT VENTRICLE RV  Basal diam:  3.43 cm RV S prime:     17.60 cm/s TAPSE (M-mode): 3.1 cm LEFT ATRIUM           Index       RIGHT ATRIUM          Index LA diam:      3.00 cm 1.64 cm/m  RA Area:     9.05 cm LA Vol (A2C): 25.3 ml 13.79 ml/m RA Volume:   18.50 ml 10.09 ml/m LA Vol (A4C): 31.8 ml 17.34 ml/m  AORTIC VALVE AV Area (Vmax):    2.89 cm AV Area (Vmean):   3.09 cm AV Area (VTI):     3.45 cm AV Vmax:           124.00 cm/s AV Vmean:          90.500 cm/s AV VTI:            0.176 m AV Peak Grad:      6.2 mmHg AV Mean Grad:      4.0 mmHg LVOT Vmax:         114.00 cm/s LVOT Vmean:        89.100 cm/s LVOT VTI:          0.193 m LVOT/AV VTI ratio: 1.10  AORTA Ao Root diam: 2.90 cm MITRAL VALVE                TRICUSPID VALVE MV Area (PHT): 2.93 cm     TR Peak grad:   17.5 mmHg MV Decel Time: 259 msec     TR Vmax:        209.00 cm/s MV E velocity: 84.00 cm/s MV A velocity: 118.00 cm/s  SHUNTS MV E/A ratio:  0.71         Systemic VTI:  0.19 m                             Systemic Diam: 2.00 cm Arnoldo Hooker MD Electronically signed by Arnoldo Hooker MD Signature Date/Time: 01/30/2020/5:31:58 PM    Final         Scheduled Meds:  aspirin EC  81 mg Oral Daily   azithromycin  500 mg Oral QPM   buprenorphine-naloxone  2 tablet Sublingual BID   Or   buprenorphine-naloxone  1 tablet Sublingual BID   chlorhexidine  15 mL Mouth Rinse BID   Chlorhexidine Gluconate Cloth  6 each Topical Daily   clopidogrel  75 mg Oral Daily   dextromethorphan  30 mg Oral BID   docusate sodium  100 mg Oral BID   ipratropium-albuterol  3 mL Nebulization Q4H   loratadine  10 mg Oral Daily   mouth rinse  15 mL Mouth Rinse q12n4p   methylPREDNISolone (SOLU-MEDROL) injection  40 mg Intravenous Q12H   mometasone-formoterol  1 puff Inhalation BID   nicotine  21 mg Transdermal Daily   silver sulfADIAZINE   Topical Daily   sodium chloride flush  3 mL Intravenous Q12H   sodium chloride flush  3 mL Intravenous Q12H    Continuous Infusions:    LOS: 3 days     Jacquelin Hawking, MD [facility] 02/01/2020, 9:12 AM  If 7PM-7AM, please contact night-coverage [email or web address]

## 2020-02-02 DIAGNOSIS — I10 Essential (primary) hypertension: Secondary | ICD-10-CM | POA: Diagnosis not present

## 2020-02-02 DIAGNOSIS — B974 Respiratory syncytial virus as the cause of diseases classified elsewhere: Secondary | ICD-10-CM | POA: Diagnosis not present

## 2020-02-02 DIAGNOSIS — J9621 Acute and chronic respiratory failure with hypoxia: Secondary | ICD-10-CM | POA: Diagnosis not present

## 2020-02-02 DIAGNOSIS — J441 Chronic obstructive pulmonary disease with (acute) exacerbation: Secondary | ICD-10-CM | POA: Diagnosis not present

## 2020-02-02 LAB — CBC
HCT: 35.6 % — ABNORMAL LOW (ref 36.0–46.0)
Hemoglobin: 11.7 g/dL — ABNORMAL LOW (ref 12.0–15.0)
MCH: 29.6 pg (ref 26.0–34.0)
MCHC: 32.9 g/dL (ref 30.0–36.0)
MCV: 90.1 fL (ref 80.0–100.0)
Platelets: 185 10*3/uL (ref 150–400)
RBC: 3.95 MIL/uL (ref 3.87–5.11)
RDW: 14.6 % (ref 11.5–15.5)
WBC: 10.1 10*3/uL (ref 4.0–10.5)
nRBC: 0 % (ref 0.0–0.2)

## 2020-02-02 LAB — BASIC METABOLIC PANEL
Anion gap: 9 (ref 5–15)
BUN: 23 mg/dL (ref 8–23)
CO2: 31 mmol/L (ref 22–32)
Calcium: 9.5 mg/dL (ref 8.9–10.3)
Chloride: 98 mmol/L (ref 98–111)
Creatinine, Ser: 0.69 mg/dL (ref 0.44–1.00)
GFR calc Af Amer: >60 mL/min (ref >60)
GFR calc non Af Amer: >60 mL/min (ref >60)
Glucose, Bld: 164 mg/dL — ABNORMAL HIGH (ref 70–99)
Potassium: 5 mmol/L (ref 3.5–5.1)
Sodium: 138 mmol/L (ref 135–145)

## 2020-02-02 MED ORDER — LOSARTAN POTASSIUM 50 MG PO TABS
50.0000 mg | ORAL_TABLET | Freq: Every day | ORAL | Status: DC
  Administered 2020-02-02 – 2020-02-08 (×7): 50 mg via ORAL
  Filled 2020-02-02 (×7): qty 1

## 2020-02-02 MED ORDER — HYDROCHLOROTHIAZIDE 12.5 MG PO CAPS
12.5000 mg | ORAL_CAPSULE | Freq: Every day | ORAL | Status: DC
  Administered 2020-02-02 – 2020-02-08 (×7): 12.5 mg via ORAL
  Filled 2020-02-02 (×7): qty 1

## 2020-02-02 MED ORDER — ALPRAZOLAM 0.25 MG PO TABS
0.2500 mg | ORAL_TABLET | Freq: Every day | ORAL | Status: DC
  Administered 2020-02-02 – 2020-02-07 (×6): 0.25 mg via ORAL
  Filled 2020-02-02 (×6): qty 1

## 2020-02-02 MED ORDER — ACETYLCYSTEINE 20 % IN SOLN
4.0000 mL | Freq: Two times a day (BID) | RESPIRATORY_TRACT | Status: DC
  Administered 2020-02-03 – 2020-02-05 (×5): 4 mL via RESPIRATORY_TRACT
  Filled 2020-02-02 (×6): qty 4

## 2020-02-02 MED ORDER — HYDROCOD POLST-CPM POLST ER 10-8 MG/5ML PO SUER
5.0000 mL | Freq: Two times a day (BID) | ORAL | Status: DC | PRN
  Administered 2020-02-04 – 2020-02-07 (×8): 5 mL via ORAL
  Filled 2020-02-02 (×8): qty 5

## 2020-02-02 MED ORDER — ACETYLCYSTEINE 20 % IN SOLN: 4.0000 mL | Freq: Two times a day (BID) | RESPIRATORY_TRACT | Status: DC

## 2020-02-02 MED ORDER — LOSARTAN POTASSIUM-HCTZ 50-12.5 MG PO TABS: 1.0000 | ORAL_TABLET | Freq: Every day | ORAL | Status: DC

## 2020-02-02 MED ORDER — ACETYLCYSTEINE 20 % IN SOLN
4.0000 mL | Freq: Once | RESPIRATORY_TRACT | Status: AC
  Administered 2020-02-02: 4 mL via RESPIRATORY_TRACT

## 2020-02-02 NOTE — Progress Notes (Signed)
PROGRESS NOTE    Christy Mason  HDQ:[id number] DOB: [birth year] DOA: 01/29/2020 PCP: [name], PA-C   Brief Narrative: Christy Mason is a 59 y.o. female with a history of COPD, GERD, hypertension, hyperthyroidism, tobacco use. Patient presented secondary to respiratory distress and found to have a COPD exacerbation secondary to RSV infection. Patient started on oxygen therapy in addition to Duoneb treatments and steroids.   Assessment & Plan:   Principal Problem:   COPD with acute exacerbation (HCC) Active Problems:   Essential hypertension   Elevated troponin   Acute on chronic respiratory failure with hypoxia (HCC)   RSV infection   COPD exacerbation Appears to be secondary to RSV infection. Patient reports that her sister also had RSV about one week ago. Patient initially required BiPAP secondary to hypoxia and tachypnea and now tolerating HFNC. She is improving slowly but not close to baseline. -Continue Oxygen supplementation, now with High flow [place] per RRT. Weaning FiO2 and flow rate as able -Continue Solu-medrol 40 mg BID -Continue Duoneb scheduled -Tussionex prn -Pulmonology consult (7/24)  Acute respiratory failure with hypoxia Secondary to above. -Oxygen therapy as above  RSV infection -Supportive care  Essential hypertension Patient is on losartan-hydrochlorothiazide as an outpatient which have been held. BP is mostly controlled -Restart home regimen as needed  Elevated troponin Likely demand ischemia. Transthoracic Echocardiogram significant for normal EF.  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 Course complicated by wound dehiscence. -Continue Plavix -Continue wound dressing  Tobacco use -Continue Nicotine patch  DVT prophylaxis: SCDs Code Status:   Code Status: Full Code Family Communication: None at bedside Disposition Plan: Keep in SDU until work of breathing and oxygen requirement improved. Anticipate discharge home  when clinically improved in several days. Will obtain PT/OT recommending SNF on discharge. TOC consulted.   Consultants:   None  Procedures:   BiPAP  Antimicrobials:  Azithromycin    Subjective: Breathing continues to improve slowly. Able to move more without the severe dyspnea she had prior.  Objective: Vitals:   02/01/20 2200 02/02/20 0100 02/02/20 0330 02/02/20 0530  BP: (!) 183/116 (!) 172/97 (!) 163/95 (!) 120/107  Pulse:  86 74 90  Resp:  (!) 27 (!) 27 17  Temp:   98.2 F (36.8 C)   TempSrc:   Oral   SpO2:  97% 97% 94%  Weight:      Height:        Intake/Output Summary (Last 24 hours) at 02/02/2020 1037 Last data filed at 02/02/2020 0500 Gross per 24 hour  Intake 240 ml  Output 1860 ml  Net -1620 ml   Filed Weights   01/29/20 1524 01/30/20 0007  Weight: 85.3 kg 82.3 kg    Examination:  General exam: Appears calm and comfortable Respiratory system: Poor air movement, mild wheezing. Significant accessory muscle/increased work of breathing, tachypnea is mild Cardiovascular system: S1 & S2 heard, RRR. No murmurs, rubs, gallops or clicks. Gastrointestinal system: Abdomen is nondistended, soft and nontender. No organomegaly or masses felt. Normal bowel sounds heard. Central nervous system: Alert and oriented. No focal neurological deficits. Musculoskeletal: No calf tenderness Skin: No cyanosis. No rashes Psychiatry: Judgement and insight appear normal. Mood & affect appropriate.     Data Reviewed: I have personally reviewed following labs and imaging studies  CBC Lab Results  Component Value Date   WBC 10.1 02/02/2020   RBC 3.95 02/02/2020   HGB 11.7 (L) 02/02/2020   HCT 35.6 (L) 02/02/2020   MCV 90.1  02/02/2020   MCH 29.6 02/02/2020   PLT 185 02/02/2020   MCHC 32.9 02/02/2020   RDW 14.6 02/02/2020   LYMPHSABS 2.3 01/30/2020   MONOABS 0.3 01/30/2020   EOSABS 0.0 01/30/2020   BASOSABS 0.0 01/30/2020     Last metabolic panel Lab Results   Component Value Date   NA 138 02/02/2020   K 5.0 02/02/2020   CL 98 02/02/2020   CO2 31 02/02/2020   BUN 23 02/02/2020   CREATININE 0.69 02/02/2020   GLUCOSE 164 (H) 02/02/2020   GFRNONAA >60 02/02/2020   GFRAA >60 02/02/2020   CALCIUM 9.5 02/02/2020   PHOS 2.8 01/30/2020   PROT 6.9 01/30/2020   ALBUMIN 3.1 (L) 01/30/2020   LABGLOB 3.2 10/08/2019   AGRATIO 1.1 (L) 10/08/2019   BILITOT 0.7 01/30/2020   ALKPHOS 102 01/30/2020   AST 39 01/30/2020   ALT 19 01/30/2020   ANIONGAP 9 02/02/2020    CBG (last 3)  No results for input(s): GLUCAP in the last 72 hours.   GFR: Estimated Creatinine Clearance: 73.4 mL/min (by C-G formula based on SCr of 0.69 mg/dL).  Coagulation Profile: No results for input(s): INR, PROTIME in the last 168 hours.  Recent Results (from the past 240 hour(s))  SARS Coronavirus 2 by RT PCR (hospital order, performed in [facility] hospital lab) Nasopharyngeal Nasopharyngeal Swab     Status: None   Collection Time: 01/29/20  3:28 PM   Specimen: Nasopharyngeal Swab  Result Value Ref Range Status   SARS Coronavirus 2 NEGATIVE NEGATIVE Final    Comment: (NOTE) SARS-CoV-2 target nucleic acids are NOT DETECTED.  The SARS-CoV-2 RNA is generally detectable in upper and lower respiratory specimens during the acute phase of infection. The lowest concentration of SARS-CoV-2 viral copies this assay can detect is 250 copies / mL. A negative result does not preclude SARS-CoV-2 infection and should not be used as the sole basis for treatment or other patient management decisions.  A negative result may occur with improper specimen collection / handling, submission of specimen other than nasopharyngeal swab, presence of viral mutation(s) within the areas targeted by this assay, and inadequate number of viral copies (<250 copies / mL). A negative result must be combined with clinical observations, patient history, and epidemiological information.  Fact Sheet for  Patients:   BoilerBrush.com.cy  Fact Sheet for Healthcare Providers: https://pope.com/  This test is not yet approved or  cleared by the [place] FDA and has been authorized for detection and/or diagnosis of SARS-CoV-2 by FDA under an Emergency Use Authorization (EUA).  This EUA will remain in effect (meaning this test can be used) for the duration of the COVID-19 declaration under Section 564(b)(1) of the Act, 21 U.S.C. section 360bbb-3(b)(1), unless the authorization is terminated or revoked sooner.  Performed at [facility], [address] [place], [place] [address]   Respiratory Panel by PCR     Status: Abnormal   Collection Time: 01/30/20 12:12 AM   Specimen: Nasopharyngeal Swab; Respiratory  Result Value Ref Range Status   Adenovirus NOT DETECTED NOT DETECTED Final   Coronavirus 229E NOT DETECTED NOT DETECTED Final    Comment: (NOTE) The Coronavirus on the Respiratory Panel, DOES NOT test for the novel  Coronavirus (2019 nCoV)    Coronavirus HKU1 NOT DETECTED NOT DETECTED Final   Coronavirus NL63 NOT DETECTED NOT DETECTED Final   Coronavirus OC43 NOT DETECTED NOT DETECTED Final   Metapneumovirus NOT DETECTED NOT DETECTED Final   Rhinovirus / Enterovirus  NOT DETECTED NOT DETECTED Final   Influenza A NOT DETECTED NOT DETECTED Final   Influenza B NOT DETECTED NOT DETECTED Final   Parainfluenza Virus 1 NOT DETECTED NOT DETECTED Final   Parainfluenza Virus 2 NOT DETECTED NOT DETECTED Final   Parainfluenza Virus 3 NOT DETECTED NOT DETECTED Final   Parainfluenza Virus 4 NOT DETECTED NOT DETECTED Final   Respiratory Syncytial Virus DETECTED (A) NOT DETECTED Final   Bordetella pertussis NOT DETECTED NOT DETECTED Final   Chlamydophila pneumoniae NOT DETECTED NOT DETECTED Final   Mycoplasma pneumoniae NOT DETECTED NOT DETECTED Final    Comment: Performed at [facility], [address], [place], [place]  [address]  MRSA PCR Screening     Status: None   Collection Time: 01/30/20 12:12 AM   Specimen: Nasopharyngeal  Result Value Ref Range Status   MRSA by PCR NEGATIVE NEGATIVE Final    Comment:        The GeneXpert MRSA Assay (FDA approved for NASAL specimens only), is one component of a comprehensive MRSA colonization surveillance program. It is not intended to diagnose MRSA infection nor to guide or monitor treatment for MRSA infections. Performed at [facility], [address] [place], [place] [address]         Radiology Studies: [facility] Chest [place] 1 View  Result Date: 02/01/2020 CLINICAL DATA:  Respiratory distress.  COPD. EXAM: PORTABLE CHEST 1 VIEW COMPARISON:  CT chest 01/29/2020.  Chest x-ray 01/29/2020. FINDINGS: Mediastinum and hilar structures are normal. Stable right apical pleural-parenchymal thickening consistent scarring. Low lung volumes. No acute infiltrates. No pleural effusion or pneumothorax. Heart size normal. Right subclavian stents noted. Postsurgical changes right chest wall. Hardware intact. IMPRESSION: Stable right apical pleural-parenchymal thickening consistent scarring. Low lung volumes. No acute infiltrates. Electronically Signed   By: Maisie Fus  Register   On: 02/01/2020 10:01        Scheduled Meds: . aspirin EC  81 mg Oral Daily  . azithromycin  500 mg Oral QPM  . buprenorphine-naloxone  2 tablet Sublingual BID   Or  . buprenorphine-naloxone  1 tablet Sublingual BID  . chlorhexidine  15 mL Mouth Rinse BID  . Chlorhexidine Gluconate Cloth  6 each Topical Daily  . clopidogrel  75 mg Oral Daily  . dextromethorphan  30 mg Oral BID  . docusate sodium  100 mg Oral BID  . ipratropium-albuterol  3 mL Nebulization QID  . loratadine  10 mg Oral Daily  . mouth rinse  15 mL Mouth Rinse q12n4p  . methylPREDNISolone (SOLU-MEDROL) injection  40 mg Intravenous Q12H  . mometasone-formoterol  1 puff Inhalation BID  . nicotine  21 mg Transdermal Daily  .  silver sulfADIAZINE   Topical BID  . sodium chloride flush  3 mL Intravenous Q12H  . sodium chloride flush  3 mL Intravenous Q12H   Continuous Infusions:    LOS: 4 days     Jacquelin Hawking, MD [facility] 02/02/2020, 10:37 AM  If 7PM-7AM, please contact night-coverage [email or web address]

## 2020-02-02 NOTE — Consult Note (Signed)
CRITICAL CARE PROGRESS NOTE    Name: Christy Mason MRN: [id number] DOB: 1960-08-30  Referring physician: Dr Caleb Popp    LOS: 4   SUBJECTIVE FINDINGS & SIGNIFICANT EVENTS    Patient description:  Christy Mason is a 59 y.o. female with a history of COPD, GERD, hypertension, hyperthyroidism, tobacco use. Patient presented secondary to respiratory distress and found to have a COPD exacerbation secondary to RSV infection. Patient started on oxygen therapy in addition to Duoneb treatments and steroids. She has continued increased O2 req with PCCM consult placed for additional evaluation and management.    Lines/tubes : External Urinary Catheter (Active)  Collection Container Dedicated Suction Canister 02/02/20 0800  Site Assessment Clean;Intact 02/02/20 0800  Output (mL) 500 mL 02/02/20 1200    Microbiology/Sepsis markers: Results for orders placed or performed during the hospital encounter of 01/29/20  SARS Coronavirus 2 by RT PCR (hospital order, performed in [facility] hospital lab) Nasopharyngeal Nasopharyngeal Swab     Status: None   Collection Time: 01/29/20  3:28 PM   Specimen: Nasopharyngeal Swab  Result Value Ref Range Status   SARS Coronavirus 2 NEGATIVE NEGATIVE Final    Comment: (NOTE) SARS-CoV-2 target nucleic acids are NOT DETECTED.  The SARS-CoV-2 RNA is generally detectable in upper and lower respiratory specimens during the acute phase of infection. The lowest concentration of SARS-CoV-2 viral copies this assay can detect is 250 copies / mL. A negative result does not preclude SARS-CoV-2 infection and should not be used as the sole basis for treatment or other patient management decisions.  A negative result may occur with improper specimen collection / handling, submission of specimen  other than nasopharyngeal swab, presence of viral mutation(s) within the areas targeted by this assay, and inadequate number of viral copies (<250 copies / mL). A negative result must be combined with clinical observations, patient history, and epidemiological information.  Fact Sheet for Patients:   BoilerBrush.com.cy  Fact Sheet for Healthcare Providers: https://pope.com/  This test is not yet approved or  cleared by the [place] FDA and has been authorized for detection and/or diagnosis of SARS-CoV-2 by FDA under an Emergency Use Authorization (EUA).  This EUA will remain in effect (meaning this test can be used) for the duration of the COVID-19 declaration under Section 564(b)(1) of the Act, 21 U.S.C. section 360bbb-3(b)(1), unless the authorization is terminated or revoked sooner.  Performed at [facility], [address] [place], [place] [address]   Respiratory Panel by PCR     Status: Abnormal   Collection Time: 01/30/20 12:12 AM   Specimen: Nasopharyngeal Swab; Respiratory  Result Value Ref Range Status   Adenovirus NOT DETECTED NOT DETECTED Final   Coronavirus 229E NOT DETECTED NOT DETECTED Final    Comment: (NOTE) The Coronavirus on the Respiratory Panel, DOES NOT test for the novel  Coronavirus (2019 nCoV)    Coronavirus HKU1 NOT DETECTED NOT DETECTED Final   Coronavirus NL63 NOT DETECTED NOT DETECTED Final   Coronavirus OC43 NOT DETECTED NOT DETECTED Final   Metapneumovirus NOT DETECTED NOT DETECTED Final   Rhinovirus / Enterovirus NOT DETECTED NOT DETECTED Final   Influenza A NOT DETECTED NOT DETECTED Final   Influenza B NOT DETECTED NOT DETECTED Final   Parainfluenza Virus 1 NOT DETECTED NOT DETECTED Final   Parainfluenza Virus 2 NOT DETECTED NOT DETECTED Final   Parainfluenza Virus 3 NOT DETECTED NOT DETECTED Final   Parainfluenza Virus 4 NOT DETECTED NOT DETECTED Final   Respiratory  Syncytial  Virus DETECTED (A) NOT DETECTED Final   Bordetella pertussis NOT DETECTED NOT DETECTED Final   Chlamydophila pneumoniae NOT DETECTED NOT DETECTED Final   Mycoplasma pneumoniae NOT DETECTED NOT DETECTED Final    Comment: Performed at [facility], [address] [place], [place] [address]  MRSA PCR Screening     Status: None   Collection Time: 01/30/20 12:12 AM   Specimen: Nasopharyngeal  Result Value Ref Range Status   MRSA by PCR NEGATIVE NEGATIVE Final    Comment:        The GeneXpert MRSA Assay (FDA approved for NASAL specimens only), is one component of a comprehensive MRSA colonization surveillance program. It is not intended to diagnose MRSA infection nor to guide or monitor treatment for MRSA infections. Performed at [facility], [address] [place], [place] [address]     Anti-infectives:  Anti-infectives (From admission, onward)   Start     Dose/Rate Route Frequency Ordered Stop   01/31/20 1800  azithromycin (ZITHROMAX) tablet 500 mg     Discontinue    "Followed by" Linked Group Details   500 mg Oral Every evening 01/30/20 0002 02/04/20 1759   01/30/20 0045  azithromycin (ZITHROMAX) 500 mg in sodium chloride 0.9 % 250 mL IVPB       "Followed by" Linked Group Details   500 mg 250 mL/hr over 60 Minutes Intravenous Every 24 hours 01/30/20 0002 01/30/20 0213         PAST MEDICAL HISTORY   Past Medical History:  Diagnosis Date  . Allergy   . Anxiety   . Arthritis   . Asthma   . Complication of anesthesia   . COPD (chronic obstructive pulmonary disease) (HCC)   . Emphysema of lung (HCC)   . GERD (gastroesophageal reflux disease)   . History of hiatal hernia   . Hypertension   . Hyperthyroidism    h/o  . Neuromuscular disorder (HCC)   . PONV (postoperative nausea and vomiting)   . Thyroid disease      SURGICAL HISTORY   Past Surgical History:  Procedure Laterality Date  . ANGIOPLASTY Right 01/01/2020   Procedure: ANGIOPLASTY ans  STENT PLACEMENT RIGHT SUBCLAVIAN AND AXILLARY ARTERY;  Surgeon: Renford Dills, MD;  Location: [facility] ORS;  Service: Vascular;  Laterality: Right;  . CARPAL TUNNEL RELEASE Right   . CHOLECYSTECTOMY    . FRACTURE SURGERY Right    plates  . FUNCTIONAL ENDOSCOPIC SINUS SURGERY    . SALPINGOOPHORECTOMY Right   . THROMBECTOMY BRACHIAL ARTERY Right 01/01/2020   Procedure: THROMBECTOMY BRACHIAL AXILLARY ARTERY;  Surgeon: Renford Dills, MD;  Location: [facility] ORS;  Service: Vascular;  Laterality: Right;  . TUBAL LIGATION    . UPPER EXTREMITY ANGIOGRAPHY Right 01/23/2019   Procedure: UPPER EXTREMITY ANGIOGRAPHY;  Surgeon: Renford Dills, MD;  Location: [facility] INVASIVE CV LAB;  Service: Cardiovascular;  Laterality: Right;  . UPPER EXTREMITY ANGIOGRAPHY Right 02/07/2019   Procedure: UPPER EXTREMITY ANGIOGRAPHY;  Surgeon: Renford Dills, MD;  Location: [facility] INVAS[facility] CV LAB;  Service: Cardiovascular;  Laterality: Right;  . UPPER EXTREMITY ANGIOGRAPHY Right 01/01/2020   Procedure: UPPER EXTREMITY ANGIOGRAPHY;  Surgeon: Renford Dills, MD;  Location: [facility] IN[facility] CV LAB;  Service: Cardiovascular;  Laterality: Right;     FAMILY HISTORY   Family History  Problem Relation Age of Onset  . Depression Mother   . Hypertension Mother   . COPD Mother   . Diabetes Father   . Hypertension  Father      SOCIAL HISTORY   Social History   Tobacco Use  . Smoking status: Current Every Day Smoker    Packs/day: 1.00    Years: 45.00    Pack years: 45.00    Types: Cigarettes    Start date: [id number]  . Smokeless tobacco: Never Used  . Tobacco comment: quit for 16 years started again 07/2014  Substance Use Topics  . Alcohol use: Not Currently  . Drug use: Not Currently     MEDICATIONS   Current Medication:  Current Facility-Administered Medications:  .  albuterol (PROVENTIL) (2.5 MG/3ML) 0.083% nebulizer solution 2.5 mg, 2.5 mg, Nebulization, Q2H PRN, Doutova, Anastassia, MD, 2.5 mg at 02/02/20  0418 .  ALPRAZolam Prudy Feeler(XANAX) tablet 0.25 mg, 0.25 mg, Oral, TID PRN, Manuela SchwartzMorrison, Brenda, NP, 0.25 mg at 02/02/20 1251 .  aspirin EC tablet 81 mg, 81 mg, Oral, Daily, Doutova, Anastassia, MD, 81 mg at 02/02/20 0936 .  [COMPLETED] azithromycin (ZITHROMAX) 500 mg in sodium chloride 0.9 % 250 mL IVPB, 500 mg, Intravenous, Q24H, Last Rate: 250 mL/hr at 01/30/20 0113, 500 mg at 01/30/20 0113 **FOLLOWED BY** azithromycin (ZITHROMAX) tablet 500 mg, 500 mg, Oral, QPM, Doutova, Anastassia, MD, 500 mg at 02/01/20 1837 .  buprenorphine-naloxone (SUBOXONE) 2-0.5 mg per SL tablet 2 tablet, 2 tablet, Sublingual, BID, 2 tablet at 02/02/20 0936 **OR** buprenorphine-naloxone (SUBOXONE) 8-2 mg per SL tablet 1 tablet, 1 tablet, Sublingual, BID, Narda BondsNettey, Ralph A, MD, 1 tablet at 02/01/20 2138 .  chlorhexidine (PERIDEX) 0.12 % solution 15 mL, 15 mL, Mouth Rinse, BID, Manuela SchwartzMorrison, Brenda, NP, 15 mL at 02/02/20 0936 .  Chlorhexidine Gluconate Cloth 2 % PADS 6 each, 6 each, Topical, Daily, Manuela SchwartzMorrison, Brenda, NP, 6 each at 01/31/20 1243 .  chlorpheniramine-HYDROcodone (TUSSIONEX) 10-8 MG/5ML suspension 5 mL, 5 mL, Oral, Q12H PRN, Narda BondsNettey, Ralph A, MD, 5 mL at 02/01/20 2333 .  clopidogrel (PLAVIX) tablet 75 mg, 75 mg, Oral, Daily, Doutova, Anastassia, MD, 75 mg at 02/02/20 0936 .  dextromethorphan (DELSYM) 30 MG/5ML liquid 30 mg, 30 mg, Oral, BID, Ouma, Hubbard HartshornElizabeth Achieng, NP, 30 mg at 02/01/20 2137 .  diphenhydrAMINE (BENADRYL) capsule 50 mg, 50 mg, Oral, QHS PRN, Manuela SchwartzMorrison, Brenda, NP .  docusate sodium (COLACE) capsule 100 mg, 100 mg, Oral, BID, Doutova, Anastassia, MD, 100 mg at 02/02/20 0936 .  ipratropium-albuterol (DUONEB) 0.5-2.5 (3) MG/3ML nebulizer solution 3 mL, 3 mL, Nebulization, QID, 3 mL at 02/02/20 1145 **FOLLOWED BY** [DISCONTINUED] ipratropium-albuterol (DUONEB) 0.5-2.5 (3) MG/3ML nebulizer solution 3 mL, 3 mL, Nebulization, QID, Nettey, Ralph A, MD .  labetalol (NORMODYNE) injection 10 mg, 10 mg, Intravenous, Q2H PRN,  Jimmye Normanuma, Elizabeth Achieng, NP, 10 mg at 02/02/20 1251 .  loratadine (CLARITIN) tablet 10 mg, 10 mg, Oral, Daily, Doutova, Anastassia, MD, 10 mg at 02/02/20 0936 .  MEDLINE mouth rinse, 15 mL, Mouth Rinse, q12n4p, Manuela SchwartzMorrison, Brenda, NP, 15 mL at 02/01/20 1838 .  methylPREDNISolone sodium succinate (SOLU-MEDROL) 40 mg/mL injection 40 mg, 40 mg, Intravenous, Q12H, Narda BondsNettey, Ralph A, MD, 40 mg at 02/02/20 0937 .  mometasone-formoterol (DULERA) 200-5 MCG/ACT inhaler 1 puff, 1 puff, Inhalation, BID, Doutova, Anastassia, MD, 1 puff at 02/02/20 0937 .  nicotine (NICODERM CQ - dosed in mg/24 hours) patch 21 mg, 21 mg, Transdermal, Daily, Doutova, Anastassia, MD, 21 mg at 02/02/20 0937 .  ondansetron (ZOFRAN) tablet 4 mg, 4 mg, Oral, Q6H PRN **OR** ondansetron (ZOFRAN) injection 4 mg, 4 mg, Intravenous, Q6H PRN, Doutova, Anastassia, MD .  oxyCODONE (Oxy IR/ROXICODONE) immediate release  tablet 5 mg, 5 mg, Oral, Q6H PRN, Doutova, Anastassia, MD, 5 mg at 01/30/20 0133 .  silver sulfADIAZINE (SILVADENE) 1 % cream, , Topical, BID, Narda Bonds, MD, Given at 02/02/20 [phone] .  sodium chloride flush (NS) 0.9 % injection 3 mL, 3 mL, Intravenous, Q12H, Doutova, Anastassia, MD, 3 mL at 02/02/20 0938 .  sodium chloride flush (NS) 0.9 % injection 3 mL, 3 mL, Intravenous, Q12H, Doutova, Anastassia, MD, 3 mL at 02/02/20 0938    ALLERGIES   Acetaminophen, Nsaids, Other, and Sular [nisoldipine er]    REVIEW OF SYSTEMS    10 point ROS done and is negative except for dyspnea and hunger  PHYSICAL EXAMINATION   Vital Signs: Temp:  [97.8 F (36.6 C)-98.7 F (37.1 C)] 98.7 F (37.1 C) (07/24 1200) Pulse Rate:  [73-113] 105 (07/24 1300) Resp:  [17-28] 18 (07/24 1300) BP: (120-183)/(84-147) 158/147 (07/24 1300) SpO2:  [89 %-98 %] 89 % (07/24 1300) FiO2 (%):  [33 %] 33 % (07/24 0811)  GENERAL:Age appropriate HEAD: Normocephalic, atraumatic.  EYES: Pupils equal, round, reactive to light.  No scleral icterus.   MOUTH: Moist mucosal membrane. NECK: Supple. No thyromegaly. No nodules. No JVD.  PULMONARY: mild rhonchi bilaterally  CARDIOVASCULAR: S1 and S2. Regular rate and rhythm. No murmurs, rubs, or gallops.  GASTROINTESTINAL: Soft, nontender, non-distended. No masses. Positive bowel sounds. No hepatosplenomegaly.  MUSCULOSKELETAL: No swelling, clubbing, or edema.  NEUROLOGIC: Mild distress due to acute illness SKIN:intact,warm,dry   PERTINENT DATA     Infusions:  Scheduled Medications: . aspirin EC  81 mg Oral Daily  . azithromycin  500 mg Oral QPM  . buprenorphine-naloxone  2 tablet Sublingual BID   Or  . buprenorphine-naloxone  1 tablet Sublingual BID  . chlorhexidine  15 mL Mouth Rinse BID  . Chlorhexidine Gluconate Cloth  6 each Topical Daily  . clopidogrel  75 mg Oral Daily  . dextromethorphan  30 mg Oral BID  . docusate sodium  100 mg Oral BID  . ipratropium-albuterol  3 mL Nebulization QID  . loratadine  10 mg Oral Daily  . mouth rinse  15 mL Mouth Rinse q12n4p  . methylPREDNISolone (SOLU-MEDROL) injection  40 mg Intravenous Q12H  . mometasone-formoterol  1 puff Inhalation BID  . nicotine  21 mg Transdermal Daily  . silver sulfADIAZINE   Topical BID  . sodium chloride flush  3 mL Intravenous Q12H  . sodium chloride flush  3 mL Intravenous Q12H   PRN Medications: albuterol, ALPRAZolam, chlorpheniramine-HYDROcodone, diphenhydrAMINE, labetalol, ondansetron **OR** ondansetron (ZOFRAN) IV, oxyCODONE Hemodynamic parameters:   Intake/Output: 07/23 0701 - 07/24 0700 In: 598 [P.O.:598] Out: 2210 [Urine:2210]  Ventilator  Settings: FiO2 (%):  [33 %] 33 %  LAB RESULTS:  Basic Metabolic Panel: Recent Labs  Lab 01/29/20 1528 01/29/20 1528 01/30/20 0150 02/02/20 0523  NA 138  --  136 138  K 4.5   < > 4.0 5.0  CL 97*  --  101 98  CO2 23  --  22 31  GLUCOSE 120*  --  191* 164*  BUN 16  --  22 23  CREATININE 1.10*  --  0.94 0.69  CALCIUM 9.4  --  8.3* 9.5  MG  --    --  2.5*  --   PHOS  --   --  2.8  --    < > = values in this interval not displayed.   Liver Function Tests: Recent Labs  Lab 01/30/20 0150  AST 39  ALT 19  ALKPHOS 102  BILITOT 0.7  PROT 6.9  ALBUMIN 3.1*   No results for input(s): LIPASE, AMYLASE in the last 168 hours. No results for input(s): AMMONIA in the last 168 hours. CBC: Recent Labs  Lab 01/29/20 1528 01/30/20 0150 02/02/20 0523  WBC 16.4* 12.3* 10.1  NEUTROABS 9.2* 9.6*  --   HGB 15.0 12.1 11.7*  HCT 46.3* 36.9 35.6*  MCV 92.2 91.8 90.1  PLT 342 220 185   Cardiac Enzymes: No results for input(s): CKTOTAL, CKMB, CKMBINDEX, TROPONINI in the last 168 hours. BNP: Invalid input(s): POCBNP CBG: Recent Labs  Lab 01/30/20 0010  GLUCAP 203*       IMAGING RESULTS:  Imaging: DG Chest Port 1 View  Result Date: 02/01/2020 CLINICAL DATA:  Respiratory distress.  COPD. EXAM: PORTABLE CHEST 1 VIEW COMPARISON:  CT chest 01/29/2020.  Chest x-ray 01/29/2020. FINDINGS: Mediastinum and hilar structures are normal. Stable right apical pleural-parenchymal thickening consistent scarring. Low lung volumes. No acute infiltrates. No pleural effusion or pneumothorax. Heart size normal. Right subclavian stents noted. Postsurgical changes right chest wall. Hardware intact. IMPRESSION: Stable right apical pleural-parenchymal thickening consistent scarring. Low lung volumes. No acute infiltrates. Electronically Signed   By: Maisie Fus  Register   On: 02/01/2020 10:01    ASSESSMENT AND PLAN     Acute Hypoxic Respiratory Failure -secondary to Severe Acute Exacerbation of COPD due to Viral lower respiratory infection - Respiratory panel +RSV infection -continue Bronchodilator Therapy -Right lower lobe atelectasis - will recruit with MetaNEB therapy utilizing saline  -Mucomyst 20% BID 18ml to thin out inspissated mucoid plugging of RLL -will d/c Tussinex and Delsym to help patient expectorate more vigorously -We will discontinue  albuterol since patient already has duo nebs ordered, agree with Solu-Medrol 40 twice daily, agree with duo nebs and Zithromax therapy   Tobacco abuse  -Transdermal nicotine replacement via patch   Anxiety disorder-NOS  - patient receiving Xanax as well as opiates-this combination may hinder respiratory drive patient should be monitored closely with plan to wean down centrally acting controlled substances as early as possible.  Subclavian artery stenosis  -Status post repair-no central line access on right side please    GI/Nutrition GI PROPHYLAXIS as indicated DIET-->TF's as tolerated Constipation protocol as indicated  ENDO - ICU hypoglycemic\Hyperglycemia protocol -check FSBS per protocol   ELECTROLYTES -follow labs as needed -replace as needed -pharmacy consultation   DVT/GI PRX ordered -SCDs  TRANSFUSIONS AS NEEDED MONITOR FSBS ASSESS the need for LABS as needed   Critical care provider statement:    Critical care time (minutes):  33   Critical care time was exclusive of:  Separately billable procedures and treating other patients   Critical care was necessary to treat or prevent imminent or life-threatening deterioration of the following conditions:   Acute hypoxemic respiratory failure, Acute COPD exacerbation, RSV viral pneumonia, tobacco abuse, multiple comoribid coditions.   Critical care was time spent personally by me on the following activities:  Development of treatment plan with patient or surrogate, discussions with consultants, evaluation of patient's response to treatment, examination of patient, obtaining history from patient or surrogate, ordering and performing treatments and interventions, ordering and review of laboratory studies and re-evaluation of patient's condition.  I assumed direction of critical care for this patient from another provider in my specialty: no    This document was prepared using Dragon voice recognition software and may include  unintentional dictation errors.    Vida Rigger, M.D.  Division of Pulmonary &  [place]

## 2020-02-02 NOTE — Plan of Care (Signed)
Pt dangled on EOB this shift, continues to refuse hygiene, states she just doesn't think she "can take it right now."  She appeared to do well getting up to [facility] today, but had a panic episode afterward requiring xanax.  She did tolerate 5 liters humidified [place] all day.  She seems fearful that she will not get back to the "way I was before."  She verbalizes undderstanding of the POC

## 2020-02-03 ENCOUNTER — Inpatient Hospital Stay: Payer: Medicaid Other

## 2020-02-03 DIAGNOSIS — J441 Chronic obstructive pulmonary disease with (acute) exacerbation: Secondary | ICD-10-CM | POA: Diagnosis not present

## 2020-02-03 DIAGNOSIS — I10 Essential (primary) hypertension: Secondary | ICD-10-CM | POA: Diagnosis not present

## 2020-02-03 DIAGNOSIS — J9621 Acute and chronic respiratory failure with hypoxia: Secondary | ICD-10-CM | POA: Diagnosis not present

## 2020-02-03 DIAGNOSIS — B974 Respiratory syncytial virus as the cause of diseases classified elsewhere: Secondary | ICD-10-CM | POA: Diagnosis not present
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
## 2020-02-03 NOTE — Progress Notes (Signed)
CRITICAL CARE PROGRESS NOTE    Name: Christy Mason MRN: [id number] DOB: Feb 13, 1961  Referring physician: Dr Caleb Popp    LOS: 5   SUBJECTIVE FINDINGS & SIGNIFICANT EVENTS    Patient description:  Christy Mason is a 59 y.o. female with a history of COPD, GERD, hypertension, hyperthyroidism, tobacco use. Patient presented secondary to respiratory distress and found to have a COPD exacerbation secondary to RSV infection. Patient started on oxygen therapy in addition to Duoneb treatments and steroids. She has continued increased O2 req with PCCM consult placed for additional evaluation and management.    02/03/20- patient complained of pain and requested re-initiation of tussinex last night as well as benzo to help with sleep, I did accomodate her request but have counseled that centrally acting meds she requests will prolong her hospitalization and likely prolong her recovery due to hindered pulmonary physiology and decreased respiratory drive. She states she will attempt not to ask for it again. Plan to continue current scope of care   Lines/tubes : External Urinary Catheter (Active)  Collection Container Dedicated Suction Canister 02/02/20 0800  Site Assessment Clean;Intact 02/02/20 0800  Output (mL) 500 mL 02/02/20 1200    Microbiology/Sepsis markers: Results for orders placed or performed during the hospital encounter of 01/29/20  SARS Coronavirus 2 by RT PCR (hospital order, performed in [facility] hospital lab) Nasopharyngeal Nasopharyngeal Swab     Status: None   Collection Time: 01/29/20  3:28 PM   Specimen: Nasopharyngeal Swab  Result Value Ref Range Status   SARS Coronavirus 2 NEGATIVE NEGATIVE Final    Comment: (NOTE) SARS-CoV-2 target nucleic acids are NOT DETECTED.  The SARS-CoV-2 RNA is generally  detectable in upper and lower respiratory specimens during the acute phase of infection. The lowest concentration of SARS-CoV-2 viral copies this assay can detect is 250 copies / mL. A negative result does not preclude SARS-CoV-2 infection and should not be used as the sole basis for treatment or other patient management decisions.  A negative result may occur with improper specimen collection / handling, submission of specimen other than nasopharyngeal swab, presence of viral mutation(s) within the areas targeted by this assay, and inadequate number of viral copies (<250 copies / mL). A negative result must be combined with clinical observations, patient history, and epidemiological information.  Fact Sheet for Patients:   BoilerBrush.com.cy  Fact Sheet for Healthcare Providers: https://pope.com/  This test is not yet approved or  cleared by the [place] FDA and has been authorized for detection and/or diagnosis of SARS-CoV-2 by FDA under an Emergency Use Authorization (EUA).  This EUA will remain in effect (meaning this test can be used) for the duration of the COVID-19 declaration under Section 564(b)(1) of the Act, 21 U.S.C. section 360bbb-3(b)(1), unless the authorization is terminated or revoked sooner.  Performed at [facility], [address] [place], [place] [address]   Respiratory Panel by PCR     Status: Abnormal   Collection Time: 01/30/20 12:12 AM   Specimen: Nasopharyngeal Swab; Respiratory  Result Value Ref Range Status   Adenovirus NOT DETECTED NOT DETECTED Final   Coronavirus 229E NOT DETECTED NOT DETECTED Final    Comment: (NOTE) The Coronavirus on the Respiratory Panel, DOES NOT test for the novel  Coronavirus (2019 nCoV)    Coronavirus HKU1 NOT DETECTED NOT DETECTED Final   Coronavirus NL63 NOT DETECTED NOT DETECTED Final   Coronavirus OC43 NOT DETECTED NOT DETECTED Final   Metapneumovirus  NOT DETECTED NOT  DETECTED Final   Rhinovirus / Enterovirus NOT DETECTED NOT DETECTED Final   Influenza A NOT DETECTED NOT DETECTED Final   Influenza B NOT DETECTED NOT DETECTED Final   Parainfluenza Virus 1 NOT DETECTED NOT DETECTED Final   Parainfluenza Virus 2 NOT DETECTED NOT DETECTED Final   Parainfluenza Virus 3 NOT DETECTED NOT DETECTED Final   Parainfluenza Virus 4 NOT DETECTED NOT DETECTED Final   Respiratory Syncytial Virus DETECTED (A) NOT DETECTED Final   Bordetella pertussis NOT DETECTED NOT DETECTED Final   Chlamydophila pneumoniae NOT DETECTED NOT DETECTED Final   Mycoplasma pneumoniae NOT DETECTED NOT DETECTED Final    Comment: Performed at [facility], [address] [place], [place] [address]  MRSA PCR Screening     Status: None   Collection Time: 01/30/20 12:12 AM   Specimen: Nasopharyngeal  Result Value Ref Range Status   MRSA by PCR NEGATIVE NEGATIVE Final    Comment:        The GeneXpert MRSA Assay (FDA approved for NASAL specimens only), is one component of a comprehensive MRSA colonization surveillance program. It is not intended to diagnose MRSA infection nor to guide or monitor treatment for MRSA infections. Performed at [facility], [address] [place], [place] [address]     Anti-infectives:  Anti-infectives (From admission, onward)   Start     Dose/Rate Route Frequency Ordered Stop   01/31/20 1800  azithromycin (ZITHROMAX) tablet 500 mg     Discontinue    "Followed by" Linked Group Details   500 mg Oral Every evening 01/30/20 0002 02/04/20 1759   01/30/20 0045  azithromycin (ZITHROMAX) 500 mg in sodium chloride 0.9 % 250 mL IVPB       "Followed by" Linked Group Details   500 mg 250 mL/hr over 60 Minutes Intravenous Every 24 hours 01/30/20 0002 01/30/20 0213         PAST MEDICAL HISTORY   Past Medical History:  Diagnosis Date  . Allergy   . Anxiety   . Arthritis   . Asthma   . Complication of anesthesia   . COPD  (chronic obstructive pulmonary disease) (HCC)   . Emphysema of lung (HCC)   . GERD (gastroesophageal reflux disease)   . History of hiatal hernia   . Hypertension   . Hyperthyroidism    h/o  . Neuromuscular disorder (HCC)   . PONV (postoperative nausea and vomiting)   . Thyroid disease      SURGICAL HISTORY   Past Surgical History:  Procedure Laterality Date  . ANGIOPLASTY Right 01/01/2020   Procedure: ANGIOPLASTY ans STENT PLACEMENT RIGHT SUBCLAVIAN AND AXILLARY ARTERY;  Surgeon: Renford DillsSchnier, Gregory G, MD;  Location: [facility] ORS;  Service: Vascular;  Laterality: Right;  . CARPAL TUNNEL RELEASE Right   . CHOLECYSTECTOMY    . FRACTURE SURGERY Right    plates  . FUNCTIONAL ENDOSCOPIC SINUS SURGERY    . SALPINGOOPHORECTOMY Right   . THROMBECTOMY BRACHIAL ARTERY Right 01/01/2020   Procedure: THROMBECTOMY BRACHIAL AXILLARY ARTERY;  Surgeon: Renford DillsSchnier, Gregory G, MD;  Location: [facility] OR[facility];  Service: Vascular;  Laterality: Right;  . TUBAL LIGATION    . UPPER EXTREMITY ANGIOGRAPHY Right 01/23/2019   Procedure: UPPER EXTREMITY ANGIOGRAPHY;  Surgeon: Renford DillsSchnier, Gregory G, MD;  Location: [facility] INVASIVE [facility] LAB;  Service: Cardiovascular;  Laterality: Right;  . UPPER EXTREMITY ANGIOGRAPHY Right 02/07/2019   Procedure: UPPER EXTREMITY ANGIOGRAPHY;  Surgeon: Renford DillsSchnier, Gregory G, MD;  Location: [facility];  Service: Cardiovascular;  Laterality: Right;  . UPPER EXTREMITY ANGIOGRAPHY Right 01/01/2020   Procedure: UPPER EXTREMITY ANGIOGRAPHY;  Surgeon: Renford Dills, MD;  Location: [facility] INV[facility] CV LAB;  Service: Cardiovascular;  Laterality: Right;     FAMILY HISTORY   Family History  Problem Relation Age of Onset  . Depression Mother   . Hypertension Mother   . COPD Mother   . Diabetes Father   . Hypertension Father      SOCIAL HISTORY   Social History   Tobacco Use  . Smoking status: Current Every Day Smoker    Packs/day: 1.00    Years: 45.00    Pack years: 45.00    Types:  Cigarettes    Start date: 76  . Smokeless tobacco: Never Used  . Tobacco comment: quit for 16 years started again 07/2014  Substance Use Topics  . Alcohol use: Not Currently  . Drug use: Not Currently     MEDICATIONS   Current Medication:  Current Facility-Administered Medications:  .  acetylcysteine (MUCOMYST) 20 % nebulizer / oral solution 4 mL, 4 mL, Nebulization, BID, Karna Christmas, Shalinda Burkholder, MD, 4 mL at 02/03/20 0723 .  ALPRAZolam Prudy Feeler) tablet 0.25 mg, 0.25 mg, Oral, TID PRN, Manuela Schwartz, NP, 0.25 mg at 02/03/20 0848 .  ALPRAZolam Prudy Feeler) tablet 0.25 mg, 0.25 mg, Oral, QHS, Diogenes Whirley, MD, 0.25 mg at 02/02/20 2207 .  aspirin EC tablet 81 mg, 81 mg, Oral, Daily, Doutova, Anastassia, MD, 81 mg at 02/03/20 0848 .  [COMPLETED] azithromycin (ZITHROMAX) 500 mg in sodium chloride 0.9 % 250 mL IVPB, 500 mg, Intravenous, Q24H, Last Rate: 250 mL/hr at 01/30/20 0113, 500 mg at 01/30/20 0113 **FOLLOWED BY** azithromycin (ZITHROMAX) tablet 500 mg, 500 mg, Oral, QPM, Doutova, Anastassia, MD, 500 mg at 02/02/20 1654 .  buprenorphine-naloxone (SUBOXONE) 2-0.5 mg per SL tablet 2 tablet, 2 tablet, Sublingual, BID, 2 tablet at 02/02/20 0936 **OR** buprenorphine-naloxone (SUBOXONE) 8-2 mg per SL tablet 1 tablet, 1 tablet, Sublingual, BID, Narda Bonds, MD, 1 tablet at 02/03/20 0849 .  chlorhexidine (PERIDEX) 0.12 % solution 15 mL, 15 mL, Mouth Rinse, BID, Manuela Schwartz, NP, 15 mL at 02/03/20 0850 .  Chlorhexidine Gluconate Cloth 2 % PADS 6 each, 6 each, Topical, Daily, Manuela Schwartz, NP, 6 each at 01/31/20 1243 .  chlorpheniramine-HYDROcodone (TUSSIONEX) 10-8 MG/5ML suspension 5 mL, 5 mL, Oral, Q12H PRN, Karna Christmas, Nelli Swalley, MD .  clopidogrel (PLAVIX) tablet 75 mg, 75 mg, Oral, Daily, Doutova, Anastassia, MD, 75 mg at 02/03/20 0849 .  docusate sodium (COLACE) capsule 100 mg, 100 mg, Oral, BID, Doutova, Anastassia, MD, 100 mg at 02/03/20 0849 .  losartan (COZAAR) tablet 50 mg, 50 mg, Oral, Daily,  50 mg at 02/03/20 0849 **AND** hydrochlorothiazide (MICROZIDE) capsule 12.5 mg, 12.5 mg, Oral, Daily, Albina Billet, RPH, 12.5 mg at 02/03/20 0850 .  ipratropium-albuterol (DUONEB) 0.5-2.5 (3) MG/3ML nebulizer solution 3 mL, 3 mL, Nebulization, QID, 3 mL at 02/03/20 0723 **FOLLOWED BY** [DISCONTINUED] ipratropium-albuterol (DUONEB) 0.5-2.5 (3) MG/3ML nebulizer solution 3 mL, 3 mL, Nebulization, QID, Nettey, Ralph A, MD .  labetalol (NORMODYNE) injection 10 mg, 10 mg, Intravenous, Q2H PRN, Jimmye Norman, NP, 10 mg at 02/02/20 1957 .  loratadine (CLARITIN) tablet 10 mg, 10 mg, Oral, Daily, Doutova, Anastassia, MD, 10 mg at 02/03/20 0848 .  MEDLINE mouth rinse, 15 mL, Mouth Rinse, q12n4p, Manuela Schwartz, NP, 15 mL at 02/02/20 1540 .  methylPREDNISolone sodium succinate (SOLU-MEDROL) 40 mg/mL injection 40 mg, 40 mg, Intravenous, Q12H, Narda Bonds, MD, 40  mg at 02/03/20 0847 .  mometasone-formoterol (DULERA) 200-5 MCG/ACT inhaler 1 puff, 1 puff, Inhalation, BID, Doutova, Anastassia, MD, 1 puff at 02/03/20 0846 .  nicotine (NICODERM CQ - dosed in mg/24 hours) patch 21 mg, 21 mg, Transdermal, Daily, Doutova, Anastassia, MD, 21 mg at 02/03/20 0846 .  ondansetron (ZOFRAN) tablet 4 mg, 4 mg, Oral, Q6H PRN **OR** ondansetron (ZOFRAN) injection 4 mg, 4 mg, Intravenous, Q6H PRN, Doutova, Anastassia, MD .  oxyCODONE (Oxy IR/ROXICODONE) immediate release tablet 5 mg, 5 mg, Oral, Q6H PRN, Doutova, Anastassia, MD, 5 mg at 02/03/20 0848 .  silver sulfADIAZINE (SILVADENE) 1 % cream, , Topical, BID, Narda Bonds, MD, Given at 02/03/20 [phone] .  sodium chloride flush (NS) 0.9 % injection 3 mL, 3 mL, Intravenous, Q12H, Doutova, Anastassia, MD, 3 mL at 02/03/20 0851 .  sodium chloride flush (NS) 0.9 % injection 3 mL, 3 mL, Intravenous, Q12H, Doutova, Anastassia, MD, 3 mL at 02/03/20 0851    ALLERGIES   Acetaminophen, Nsaids, Other, and Sular [nisoldipine er]    REVIEW OF SYSTEMS    10 point  ROS done and is negative except for dyspnea and hunger  PHYSICAL EXAMINATION   Vital Signs: Temp:  [97.7 F (36.5 C)-98.7 F (37.1 C)] 98.1 F (36.7 C) (07/25 0300) Pulse Rate:  [64-105] 67 (07/25 0600) Resp:  [16-28] 23 (07/25 0600) BP: (102-188)/(74-147) 132/78 (07/25 0600) SpO2:  [89 %-100 %] 97 % (07/25 0600)  GENERAL:Age appropriate HEAD: Normocephalic, atraumatic.  EYES: Pupils equal, round, reactive to light.  No scleral icterus.  MOUTH: Moist mucosal membrane. NECK: Supple. No thyromegaly. No nodules. No JVD.  PULMONARY: mild rhonchi bilaterally  CARDIOVASCULAR: S1 and S2. Regular rate and rhythm. No murmurs, rubs, or gallops.  GASTROINTESTINAL: Soft, nontender, non-distended. No masses. Positive bowel sounds. No hepatosplenomegaly.  MUSCULOSKELETAL: No swelling, clubbing, or edema.  NEUROLOGIC: Mild distress due to acute illness SKIN:intact,warm,dry   PERTINENT DATA     Infusions:  Scheduled Medications: . acetylcysteine  4 mL Nebulization BID  . ALPRAZolam  0.25 mg Oral QHS  . aspirin EC  81 mg Oral Daily  . azithromycin  500 mg Oral QPM  . buprenorphine-naloxone  2 tablet Sublingual BID   Or  . buprenorphine-naloxone  1 tablet Sublingual BID  . chlorhexidine  15 mL Mouth Rinse BID  . Chlorhexidine Gluconate Cloth  6 each Topical Daily  . clopidogrel  75 mg Oral Daily  . docusate sodium  100 mg Oral BID  . losartan  50 mg Oral Daily   And  . hydrochlorothiazide  12.5 mg Oral Daily  . ipratropium-albuterol  3 mL Nebulization QID  . loratadine  10 mg Oral Daily  . mouth rinse  15 mL Mouth Rinse q12n4p  . methylPREDNISolone (SOLU-MEDROL) injection  40 mg Intravenous Q12H  . mometasone-formoterol  1 puff Inhalation BID  . nicotine  21 mg Transdermal Daily  . silver sulfADIAZINE   Topical BID  . sodium chloride flush  3 mL Intravenous Q12H  . sodium chloride flush  3 mL Intravenous Q12H   PRN Medications: ALPRAZolam, chlorpheniramine-HYDROcodone,  labetalol, ondansetron **OR** ondansetron (ZOFRAN) IV, oxyCODONE Hemodynamic parameters:   Intake/Output: 07/24 0701 - 07/25 0700 In: -  Out: 2700 [Urine:2700]  Ventilator  Settings:    LAB RESULTS:  Basic Metabolic Panel: Recent Labs  Lab 01/29/20 1528 01/29/20 1528 01/30/20 0150 02/02/20 0523  NA 138  --  136 138  K 4.5   < > 4.0 5.0  CL 97*  --  101 98  CO2 23  --  22 31  GLUCOSE 120*  --  191* 164*  BUN 16  --  22 23  CREATININE 1.10*  --  0.94 0.69  CALCIUM 9.4  --  8.3* 9.5  MG  --   --  2.5*  --   PHOS  --   --  2.8  --    < > = values in this interval not displayed.   Liver Function Tests: Recent Labs  Lab 01/30/20 0150  AST 39  ALT 19  ALKPHOS 102  BILITOT 0.7  PROT 6.9  ALBUMIN 3.1*   No results for input(s): LIPASE, AMYLASE in the last 168 hours. No results for input(s): AMMONIA in the last 168 hours. CBC: Recent Labs  Lab 01/29/20 1528 01/30/20 0150 02/02/20 0523  WBC 16.4* 12.3* 10.1  NEUTROABS 9.2* 9.6*  --   HGB 15.0 12.1 11.7*  HCT 46.3* 36.9 35.6*  MCV 92.2 91.8 90.1  PLT 342 220 185   Cardiac Enzymes: No results for input(s): CKTOTAL, CKMB, CKMBINDEX, TROPONINI in the last 168 hours. BNP: Invalid input(s): POCBNP CBG: Recent Labs  Lab 01/30/20 0010  GLUCAP 203*       IMAGING RESULTS:  Imaging: DG Chest Port 1 View  Result Date: 02/01/2020 CLINICAL DATA:  Respiratory distress.  COPD. EXAM: PORTABLE CHEST 1 VIEW COMPARISON:  CT chest 01/29/2020.  Chest x-ray 01/29/2020. FINDINGS: Mediastinum and hilar structures are normal. Stable right apical pleural-parenchymal thickening consistent scarring. Low lung volumes. No acute infiltrates. No pleural effusion or pneumothorax. Heart size normal. Right subclavian stents noted. Postsurgical changes right chest wall. Hardware intact. IMPRESSION: Stable right apical pleural-parenchymal thickening consistent scarring. Low lung volumes. No acute infiltrates. Electronically Signed   By:  Maisie Fus  Register   On: 02/01/2020 10:01    ASSESSMENT AND PLAN     Acute Hypoxic Respiratory Failure -secondary to Severe Acute Exacerbation of COPD due to Viral lower respiratory infection - Respiratory panel +RSV infection -continue Bronchodilator Therapy -Right lower lobe atelectasis - will recruit with MetaNEB therapy utilizing saline  -Mucomyst 20% BID 74ml to thin out inspissated mucoid plugging of RLL -will d/c Tussinex and Delsym to help patient expectorate more vigorously -We will discontinue albuterol since patient already has duo nebs ordered, agree with Solu-Medrol 40 twice daily, agree with duo nebs and Zithromax therapy   Tobacco abuse  -Transdermal nicotine replacement via patch   Anxiety disorder-NOS  - patient receiving Xanax as well as opiates-this combination may decrease respiratory drive patient should be monitored closely with plan to wean down centrally acting controlled substances as early as possible.  Subclavian artery stenosis  -Status post repair-no central line access on right side please   GI/Nutrition GI PROPHYLAXIS as indicated DIET-->TF's as tolerated Constipation protocol as indicated  ENDO - ICU hypoglycemic\Hyperglycemia protocol -check FSBS per protocol   ELECTROLYTES -follow labs as needed -replace as needed -pharmacy consultation   DVT/GI PRX ordered -SCDs  TRANSFUSIONS AS NEEDED MONITOR FSBS ASSESS the need for LABS as needed   Critical care provider statement:    Critical care time (minutes):  33   Critical care time was exclusive of:  Separately billable procedures and treating other patients   Critical care was necessary to treat or prevent imminent or life-threatening deterioration of the following conditions:   Acute hypoxemic respiratory failure, Acute COPD exacerbation, RSV viral pneumonia, tobacco abuse, multiple comoribid coditions.   Critical care was time spent personally by me on the following  activities:   Development of treatment plan with patient or surrogate, discussions with consultants, evaluation of patient's response to treatment, examination of patient, obtaining history from patient or surrogate, ordering and performing treatments and interventions, ordering and review of laboratory studies and re-evaluation of patient's condition.  I assumed direction of critical care for this patient from another provider in my specialty: no    This document was prepared using Dragon voice recognition software and may include unintentional dictation errors.    Vida Rigger, M.D.  Division of Pulmonary & Critical Care Medicine  [facility]

## 2020-02-03 NOTE — Progress Notes (Signed)
PROGRESS NOTE    Christy Mason  SHF:[id number] DOB: 12-21-1960 DOA: 01/29/2020 PCP: [name], PA-C   Brief Narrative: Christy Mason is a 59 y.o. female with a history of COPD, GERD, hypertension, hyperthyroidism, tobacco use. Patient presented secondary to respiratory distress and found to have a COPD exacerbation secondary to RSV infection. Patient started on oxygen therapy in addition to Duoneb treatments and steroids.   Assessment & Plan:   Principal Problem:   COPD with acute exacerbation (HCC) Active Problems:   Essential hypertension   Elevated troponin   Acute on chronic respiratory failure with hypoxia (HCC)   RSV infection   COPD exacerbation Appears to be secondary to RSV infection. Patient reports that her sister also had RSV about one week ago. Patient initially required BiPAP secondary to hypoxia and tachypnea and now tolerating HFNC. She is improving slowly but not close to baseline. -Continue Oxygen supplementation, now with High flow [place] per RRT. Weaning FiO2 and flow rate as able. -Continue Solu-medrol 40 mg BID -Continue Duoneb scheduled -Tussionex prn -Pulmonology recommendations: MetNEB, Mucomyst; discontinued albuterol Tussinex and Delsym -Repeat X-ray secondary to rales  Acute respiratory failure with hypoxia Secondary to above. -Oxygen therapy as above  RSV infection -Supportive care  Essential hypertension Patient is on losartan-hydrochlorothiazide as an outpatient which have been held. BP is mostly controlled -Restart home regimen as needed  Elevated troponin Likely demand ischemia. Transthoracic Echocardiogram significant for normal EF.  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 Course complicated by wound dehiscence. -Continue Plavix -Continue wound dressing  Tobacco use -Continue Nicotine patch  DVT prophylaxis: SCDs Code Status:   Code Status: Full Code Family Communication: None at bedside Disposition Plan:  Keep in SDU until work of breathing and oxygen requirement improved. Anticipate discharge home when clinically improved in several days. Will obtain PT/OT recommending SNF on discharge. TOC consulted.   Consultants:   None  Procedures:   BiPAP  Antimicrobials:  Azithromycin    Subjective: Feel better every day but not at baseline.  Objective: Vitals:   02/03/20 0300 02/03/20 0400 02/03/20 0500 02/03/20 0600  BP: (!) 155/90 (!) 171/98 (!) 146/81 (!) 132/78  Pulse: 68 67 64 67  Resp: 22 20 20 23   Temp: 98.1 F (36.7 C)     TempSrc: Oral     SpO2: 97% 97% 97% 97%  Weight:      Height:        Intake/Output Summary (Last 24 hours) at 02/03/2020 0825 Last data filed at 02/03/2020 02/05/2020 Gross per 24 hour  Intake --  Output 2200 ml  Net -2200 ml   Filed Weights   01/29/20 1524 01/30/20 0007  Weight: 85.3 kg 82.3 kg    Examination:  General exam: Appears calm and comfortable Respiratory system: Diminished breath sounds with rales. No wheezing heard. mild tachypnea with increased respiratory effort Cardiovascular system: S1 & S2 heard, RRR. No murmurs, rubs, gallops or clicks. Gastrointestinal system: Abdomen is nondistended, soft and nontender. No organomegaly or masses felt. Normal bowel sounds heard. Central nervous system: Alert and oriented. No focal neurological deficits. Musculoskeletal: No edema. No calf tenderness Skin: No cyanosis. No rashes Psychiatry: Judgement and insight appear normal. Mood & affect appropriate.     Data Reviewed: I have personally reviewed following labs and imaging studies  CBC Lab Results  Component Value Date   WBC 10.1 02/02/2020   RBC 3.95 02/02/2020   HGB 11.7 (L) 02/02/2020   HCT 35.6 (L) 02/02/2020  MCV 90.1 02/02/2020   MCH 29.6 02/02/2020   PLT 185 02/02/2020   MCHC 32.9 02/02/2020   RDW 14.6 02/02/2020   LYMPHSABS 2.3 01/30/2020   MONOABS 0.3 01/30/2020   EOSABS 0.0 01/30/2020   BASOSABS 0.0 01/30/2020      Last metabolic panel Lab Results  Component Value Date   NA 138 02/02/2020   K 5.0 02/02/2020   CL 98 02/02/2020   CO2 31 02/02/2020   BUN 23 02/02/2020   CREATININE 0.69 02/02/2020   GLUCOSE 164 (H) 02/02/2020   GFRNONAA >60 02/02/2020   GFRAA >60 02/02/2020   CALCIUM 9.5 02/02/2020   PHOS 2.8 01/30/2020   PROT 6.9 01/30/2020   ALBUMIN 3.1 (L) 01/30/2020   LABGLOB 3.2 10/08/2019   AGRATIO 1.1 (L) 10/08/2019   BILITOT 0.7 01/30/2020   ALKPHOS 102 01/30/2020   AST 39 01/30/2020   ALT 19 01/30/2020   ANIONGAP 9 02/02/2020    CBG (last 3)  No results for input(s): GLUCAP in the last 72 hours.   GFR: Estimated Creatinine Clearance: 73.4 mL/min (by C-G formula based on SCr of 0.69 mg/dL).  Coagulation Profile: No results for input(s): INR, PROTIME in the last 168 hours.  Recent Results (from the past 240 hour(s))  SARS Coronavirus 2 by RT PCR (hospital order, performed in [facility] hospital lab) Nasopharyngeal Nasopharyngeal Swab     Status: None   Collection Time: 01/29/20  3:28 PM   Specimen: Nasopharyngeal Swab  Result Value Ref Range Status   SARS Coronavirus 2 NEGATIVE NEGATIVE Final    Comment: (NOTE) SARS-CoV-2 target nucleic acids are NOT DETECTED.  The SARS-CoV-2 RNA is generally detectable in upper and lower respiratory specimens during the acute phase of infection. The lowest concentration of SARS-CoV-2 viral copies this assay can detect is 250 copies / mL. A negative result does not preclude SARS-CoV-2 infection and should not be used as the sole basis for treatment or other patient management decisions.  A negative result may occur with improper specimen collection / handling, submission of specimen other than nasopharyngeal swab, presence of viral mutation(s) within the areas targeted by this assay, and inadequate number of viral copies (<250 copies / mL). A negative result must be combined with clinical observations, patient history, and  epidemiological information.  Fact Sheet for Patients:   BoilerBrush.com.cy  Fact Sheet for Healthcare Providers: https://pope.com/  This test is not yet approved or  cleared by the [place] FDA and has been authorized for detection and/or diagnosis of SARS-CoV-2 by FDA under an Emergency Use Authorization (EUA).  This EUA will remain in effect (meaning this test can be used) for the duration of the COVID-19 declaration under Section 564(b)(1) of the Act, 21 U.S.C. section 360bbb-3(b)(1), unless the authorization is terminated or revoked sooner.  Performed at [facility], [address] [place], [place] [address]   Respiratory Panel by PCR     Status: Abnormal   Collection Time: 01/30/20 12:12 AM   Specimen: Nasopharyngeal Swab; Respiratory  Result Value Ref Range Status   Adenovirus NOT DETECTED NOT DETECTED Final   Coronavirus 229E NOT DETECTED NOT DETECTED Final    Comment: (NOTE) The Coronavirus on the Respiratory Panel, DOES NOT test for the novel  Coronavirus (2019 nCoV)    Coronavirus HKU1 NOT DETECTED NOT DETECTED Final   Coronavirus NL63 NOT DETECTED NOT DETECTED Final   Coronavirus OC43 NOT DETECTED NOT DETECTED Final   Metapneumovirus NOT DETECTED NOT DETECTED Final   Rhinovirus /  Enterovirus NOT DETECTED NOT DETECTED Final   Influenza A NOT DETECTED NOT DETECTED Final   Influenza B NOT DETECTED NOT DETECTED Final   Parainfluenza Virus 1 NOT DETECTED NOT DETECTED Final   Parainfluenza Virus 2 NOT DETECTED NOT DETECTED Final   Parainfluenza Virus 3 NOT DETECTED NOT DETECTED Final   Parainfluenza Virus 4 NOT DETECTED NOT DETECTED Final   Respiratory Syncytial Virus DETECTED (A) NOT DETECTED Final   Bordetella pertussis NOT DETECTED NOT DETECTED Final   Chlamydophila pneumoniae NOT DETECTED NOT DETECTED Final   Mycoplasma pneumoniae NOT DETECTED NOT DETECTED Final    Comment: Performed at [facility], [address] [place], [place] [address]  MRSA PCR Screening     Status: None   Collection Time: 01/30/20 12:12 AM   Specimen: Nasopharyngeal  Result Value Ref Range Status   MRSA by PCR NEGATIVE NEGATIVE Final    Comment:        The GeneXpert MRSA Assay (FDA approved for NASAL specimens only), is one component of a comprehensive MRSA colonization surveillance program. It is not intended to diagnose MRSA infection nor to guide or monitor treatment for MRSA infections. Performed at [facility], [address], [place], [place] [address]         Radiology Studies: [facility] Chest [place] 1 View  Result Date: 02/01/2020 CLINICAL DATA:  Respiratory distress.  COPD. EXAM: PORTABLE CHEST 1 VIEW COMPARISON:  CT chest 01/29/2020.  Chest x-ray 01/29/2020. FINDINGS: Mediastinum and hilar structures are normal. Stable right apical pleural-parenchymal thickening consistent scarring. Low lung volumes. No acute infiltrates. No pleural effusion or pneumothorax. Heart size normal. Right subclavian stents noted. Postsurgical changes right chest wall. Hardware intact. IMPRESSION: Stable right apical pleural-parenchymal thickening consistent scarring. Low lung volumes. No acute infiltrates. Electronically Signed   By: Maisie Fus  Register   On: 02/01/2020 10:01        Scheduled Meds: . acetylcysteine  4 mL Nebulization BID  . ALPRAZolam  0.25 mg Oral QHS  . aspirin EC  81 mg Oral Daily  . azithromycin  500 mg Oral QPM  . buprenorphine-naloxone  2 tablet Sublingual BID   Or  . buprenorphine-naloxone  1 tablet Sublingual BID  . chlorhexidine  15 mL Mouth Rinse BID  . Chlorhexidine Gluconate Cloth  6 each Topical Daily  . clopidogrel  75 mg Oral Daily  . docusate sodium  100 mg Oral BID  . losartan  50 mg Oral Daily   And  . hydrochlorothiazide  12.5 mg Oral Daily  . ipratropium-albuterol  3 mL Nebulization QID  . loratadine  10 mg Oral Daily  . mouth rinse  15 mL Mouth Rinse  q12n4p  . methylPREDNISolone (SOLU-MEDROL) injection  40 mg Intravenous Q12H  . mometasone-formoterol  1 puff Inhalation BID  . nicotine  21 mg Transdermal Daily  . silver sulfADIAZINE   Topical BID  . sodium chloride flush  3 mL Intravenous Q12H  . sodium chloride flush  3 mL Intravenous Q12H   Continuous Infusions:    LOS: 5 days     Jacquelin Hawking, MD [facility] 02/03/2020, 8:25 AM  If 7PM-7AM, please contact night-coverage [email or web address]

## 2020-02-04 DIAGNOSIS — J441 Chronic obstructive pulmonary disease with (acute) exacerbation: Secondary | ICD-10-CM | POA: Diagnosis not present

## 2020-02-04 DIAGNOSIS — J9621 Acute and chronic respiratory failure with hypoxia: Secondary | ICD-10-CM | POA: Diagnosis not present

## 2020-02-04 DIAGNOSIS — I10 Essential (primary) hypertension: Secondary | ICD-10-CM | POA: Diagnosis not present

## 2020-02-04 DIAGNOSIS — B974 Respiratory syncytial virus as the cause of diseases classified elsewhere: Secondary | ICD-10-CM | POA: Diagnosis not present

## 2020-02-04 LAB — MAGNESIUM: Magnesium: 2 mg/dL (ref 1.7–2.4)

## 2020-02-04 LAB — BASIC METABOLIC PANEL
Anion gap: 11 (ref 5–15)
BUN: 24 mg/dL — ABNORMAL HIGH (ref 8–23)
CO2: 31 mmol/L (ref 22–32)
Calcium: 8.9 mg/dL (ref 8.9–10.3)
Chloride: 95 mmol/L — ABNORMAL LOW (ref 98–111)
Creatinine, Ser: 0.63 mg/dL (ref 0.44–1.00)
GFR calc Af Amer: >60 mL/min (ref >60)
GFR calc non Af Amer: >60 mL/min (ref >60)
Glucose, Bld: 155 mg/dL — ABNORMAL HIGH (ref 70–99)
Potassium: 4.5 mmol/L (ref 3.5–5.1)
Sodium: 137 mmol/L (ref 135–145)

## 2020-02-04 LAB — CBC
HCT: 38.6 % (ref 36.0–46.0)
Hemoglobin: 12.6 g/dL (ref 12.0–15.0)
MCH: 29.7 pg (ref 26.0–34.0)
MCHC: 32.6 g/dL (ref 30.0–36.0)
MCV: 91 fL (ref 80.0–100.0)
Platelets: 213 10*3/uL (ref 150–400)
RBC: 4.24 MIL/uL (ref 3.87–5.11)
RDW: 14.4 % (ref 11.5–15.5)
WBC: 11.9 10*3/uL — ABNORMAL HIGH (ref 4.0–10.5)
nRBC: 0 % (ref 0.0–0.2)

## 2020-02-04 LAB — PHOSPHORUS: Phosphorus: 4 mg/dL (ref 2.5–4.6)

## 2020-02-04 NOTE — Progress Notes (Signed)
PROGRESS NOTE    Christy Mason  GBT:[id number] DOB: 14-Jan-1961 DOA: 01/29/2020 PCP: [name], PA-C   Brief Narrative: Christy Mason is a 59 y.o. female with a history of COPD, GERD, hypertension, hyperthyroidism, tobacco use. Patient presented secondary to respiratory distress and found to have a COPD exacerbation secondary to RSV infection. Patient started on oxygen therapy in addition to Duoneb treatments and steroids.   Assessment & Plan:   Principal Problem:   COPD with acute exacerbation (HCC) Active Problems:   Essential hypertension   Elevated troponin   Acute on chronic respiratory failure with hypoxia (HCC)   RSV infection   COPD exacerbation Appears to be secondary to RSV infection. Patient reports that her sister also had RSV about one week ago. Patient initially required BiPAP secondary to hypoxia and tachypnea and now tolerating HFNC. She is improving slowly but not close to baseline. -Continue Oxygen supplementation, now with High flow [place] per RRT. Weaning FiO2 and flow rate as able. Decrease to Solu-medrol daily -Continue Duoneb scheduled -Pulmonology recommendations: MetNEB, Mucomyst; discontinued albuterol Tussinex and Delsym  Acute respiratory failure with hypoxia Secondary to above. -Oxygen therapy as above  RSV infection -Supportive care  Essential hypertension Patient is on losartan-hydrochlorothiazide as an outpatient which have been held. BP is more uncontrolled -Continue losartan-hydrochlorothiazide  Elevated troponin Likely demand ischemia. Transthoracic Echocardiogram significant for normal EF.  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 Course complicated by wound dehiscence. -Continue Plavix -Continue wound dressing  Tobacco use -Continue Nicotine patch  DVT prophylaxis: SCDs Code Status:   Code Status: Full Code Family Communication: None at bedside Disposition Plan: Transfer to medical floor. PT  recommendations for placement (currently recommending SNF). Anticipate she will be ready for discharge in 1-3 days   Consultants:   Pulmonology  Procedures:   BiPAP  Antimicrobials:  Azithromycin    Subjective: Continues to feel better every day. Not at baseline.  Objective: Vitals:   02/04/20 1000 02/04/20 1100 02/04/20 1200 02/04/20 1300  BP: (!) 144/92 (!) 143/85 (!) 147/90 (!) 150/79  Pulse: 79 89 72 84  Resp: 17 (!) 24 17 21   Temp:   97.8 F (36.6 C)   TempSrc:      SpO2: 95% 97% 97% 97%  Weight:      Height:        Intake/Output Summary (Last 24 hours) at 02/04/2020 1431 Last data filed at 02/04/2020 1300 Gross per 24 hour  Intake 1070 ml  Output 1875 ml  Net -805 ml   Filed Weights   01/29/20 1524 01/30/20 0007  Weight: 85.3 kg 82.3 kg    Examination:  General exam: Appears calm and comfortable Respiratory system: Clear to auscultation, diminished. Respiratory effort increased. Cardiovascular system: S1 & S2 heard, RRR. No murmurs, rubs, gallops or clicks. Gastrointestinal system: Abdomen is nondistended, soft and nontender. No organomegaly or masses felt. Normal bowel sounds heard. Central nervous system: Alert and oriented. No focal neurological deficits. Musculoskeletal: No calf tenderness Skin: No cyanosis. No rashes Psychiatry: Judgement and insight appear normal. Mood & affect appropriate.     Data Reviewed: I have personally reviewed following labs and imaging studies  CBC Lab Results  Component Value Date   WBC 11.9 (H) 02/04/2020   RBC 4.24 02/04/2020   HGB 12.6 02/04/2020   HCT 38.6 02/04/2020   MCV 91.0 02/04/2020   MCH 29.7 02/04/2020   PLT 213 02/04/2020   MCHC 32.6 02/04/2020   RDW 14.4 02/04/2020   LYMPHSABS  2.3 01/30/2020   MONOABS 0.3 01/30/2020   EOSABS 0.0 01/30/2020   BASOSABS 0.0 01/30/2020     Last metabolic panel Lab Results  Component Value Date   NA 137 02/04/2020   K 4.5 02/04/2020   CL 95 (L)  02/04/2020   CO2 31 02/04/2020   BUN 24 (H) 02/04/2020   CREATININE 0.63 02/04/2020   GLUCOSE 155 (H) 02/04/2020   GFRNONAA >60 02/04/2020   GFRAA >60 02/04/2020   CALCIUM 8.9 02/04/2020   PHOS 4.0 02/04/2020   PROT 6.9 01/30/2020   ALBUMIN 3.1 (L) 01/30/2020   LABGLOB 3.2 10/08/2019   AGRATIO 1.1 (L) 10/08/2019   BILITOT 0.7 01/30/2020   ALKPHOS 102 01/30/2020   AST 39 01/30/2020   ALT 19 01/30/2020   ANIONGAP 11 02/04/2020    CBG (last 3)  No results for input(s): GLUCAP in the last 72 hours.   GFR: Estimated Creatinine Clearance: 73.4 mL/min (by C-G formula based on SCr of 0.63 mg/dL).  Coagulation Profile: No results for input(s): INR, PROTIME in the last 168 hours.  Recent Results (from the past 240 hour(s))  SARS Coronavirus 2 by RT PCR (hospital order, performed in [facility] hospital lab) Nasopharyngeal Nasopharyngeal Swab     Status: None   Collection Time: 01/29/20  3:28 PM   Specimen: Nasopharyngeal Swab  Result Value Ref Range Status   SARS Coronavirus 2 NEGATIVE NEGATIVE Final    Comment: (NOTE) SARS-CoV-2 target nucleic acids are NOT DETECTED.  The SARS-CoV-2 RNA is generally detectable in upper and lower respiratory specimens during the acute phase of infection. The lowest concentration of SARS-CoV-2 viral copies this assay can detect is 250 copies / mL. A negative result does not preclude SARS-CoV-2 infection and should not be used as the sole basis for treatment or other patient management decisions.  A negative result may occur with improper specimen collection / handling, submission of specimen other than nasopharyngeal swab, presence of viral mutation(s) within the areas targeted by this assay, and inadequate number of viral copies (<250 copies / mL). A negative result must be combined with clinical observations, patient history, and epidemiological information.  Fact Sheet for Patients:   BoilerBrush.com.cy  Fact Sheet  for Healthcare Providers: https://pope.com/  This test is not yet approved or  cleared by the [place] FDA and has been authorized for detection and/or diagnosis of SARS-CoV-2 by FDA under an Emergency Use Authorization (EUA).  This EUA will remain in effect (meaning this test can be used) for the duration of the COVID-19 declaration under Section 564(b)(1) of the Act, 21 U.S.C. section 360bbb-3(b)(1), unless the authorization is terminated or revoked sooner.  Performed at [facility], [address] [place], [place] [address]   Respiratory Panel by PCR     Status: Abnormal   Collection Time: 01/30/20 12:12 AM   Specimen: Nasopharyngeal Swab; Respiratory  Result Value Ref Range Status   Adenovirus NOT DETECTED NOT DETECTED Final   Coronavirus 229E NOT DETECTED NOT DETECTED Final    Comment: (NOTE) The Coronavirus on the Respiratory Panel, DOES NOT test for the novel  Coronavirus (2019 nCoV)    Coronavirus HKU1 NOT DETECTED NOT DETECTED Final   Coronavirus NL63 NOT DETECTED NOT DETECTED Final   Coronavirus OC43 NOT DETECTED NOT DETECTED Final   Metapneumovirus NOT DETECTED NOT DETECTED Final   Rhinovirus / Enterovirus NOT DETECTED NOT DETECTED Final   Influenza A NOT DETECTED NOT DETECTED Final   Influenza B NOT DETECTED NOT DETECTED  Final   Parainfluenza Virus 1 NOT DETECTED NOT DETECTED Final   Parainfluenza Virus 2 NOT DETECTED NOT DETECTED Final   Parainfluenza Virus 3 NOT DETECTED NOT DETECTED Final   Parainfluenza Virus 4 NOT DETECTED NOT DETECTED Final   Respiratory Syncytial Virus DETECTED (A) NOT DETECTED Final   Bordetella pertussis NOT DETECTED NOT DETECTED Final   Chlamydophila pneumoniae NOT DETECTED NOT DETECTED Final   Mycoplasma pneumoniae NOT DETECTED NOT DETECTED Final    Comment: Performed at [facility], [address], [place], [place] [address]  MRSA PCR Screening     Status: None   Collection Time: 01/30/20  12:12 AM   Specimen: Nasopharyngeal  Result Value Ref Range Status   MRSA by PCR NEGATIVE NEGATIVE Final    Comment:        The GeneXpert MRSA Assay (FDA approved for NASAL specimens only), is one component of a comprehensive MRSA colonization surveillance program. It is not intended to diagnose MRSA infection nor to guide or monitor treatment for MRSA infections. Performed at [facility], [address], [place], [place] [address]         Radiology Studies: [facility] Chest [place] 1 View  Result Date: 02/03/2020 CLINICAL DATA:  RSV infection. EXAM: PORTABLE CHEST 1 VIEW COMPARISON:  02/01/2020 FINDINGS: Lungs are adequately inflated without focal airspace consolidation or effusion. Cardiomediastinal silhouette and remainder of the exam is unchanged. IMPRESSION: No active disease. Electronically Signed   By: Elberta Fortis M.D.   On: 02/03/2020 10:07        Scheduled Meds: . acetylcysteine  4 mL Nebulization BID  . ALPRAZolam  0.25 mg Oral QHS  . aspirin EC  81 mg Oral Daily  . buprenorphine-naloxone  2 tablet Sublingual BID   Or  . buprenorphine-naloxone  1 tablet Sublingual BID  . chlorhexidine  15 mL Mouth Rinse BID  . Chlorhexidine Gluconate Cloth  6 each Topical Daily  . clopidogrel  75 mg Oral Daily  . docusate sodium  100 mg Oral BID  . losartan  50 mg Oral Daily   And  . hydrochlorothiazide  12.5 mg Oral Daily  . ipratropium-albuterol  3 mL Nebulization QID  . loratadine  10 mg Oral Daily  . mouth rinse  15 mL Mouth Rinse q12n4p  . methylPREDNISolone (SOLU-MEDROL) injection  40 mg Intravenous Q12H  . mometasone-formoterol  1 puff Inhalation BID  . nicotine  21 mg Transdermal Daily  . silver sulfADIAZINE   Topical BID  . sodium chloride flush  3 mL Intravenous Q12H  . sodium chloride flush  3 mL Intravenous Q12H   Continuous Infusions:    LOS: 6 days     Jacquelin Hawking, MD [facility] 02/04/2020, 2:31 PM  If 7PM-7AM, please contact  night-coverage [email or web address]

## 2020-02-04 NOTE — Progress Notes (Signed)
Pt A&OX4. Symmetrical in all extremities. RUE dressing changed per orders. Saturating above 94% on 4L nasal cannula. SOB with exertion.  Poor appetite. Will continue to monitor.

## 2020-02-04 NOTE — TOC Initial Note (Addendum)
Transition of Care [facility]) - Initial/Assessment Note    Patient Details  Name: Christy Mason MRN: [id number] Date of Birth: February 04, 1961  Transition of Care [facility]) CM/SW Contact:    Candie Chroman, LCSW Phone Number: 02/04/2020, 2:43 PM  Clinical Narrative:  CSW met with patient. No supports at bedside. CSW introduced role and explained that PT recommendations would be discussed. Patient is not interested in SNF placement at this time but is agreeable to home health. [place] is reviewing referral. Patient lives home alone but her sister lives next door. Patient is not on oxygen at home. Currently on 4 L. Will follow for this potential discharge need. No further concerns. CSW encouraged patient to contact CSW as needed. CSW will continue to follow patient for support and facilitate return home when stable. She stated either her sister or aunt will pick her up when discharged.           2:51 pm: Advanced unable to consider patient until she gets up with therapy.       Expected Discharge Plan: [place] Barriers to Discharge: Continued Medical Work up   Patient Goals and CMS Choice        Expected Discharge Plan and Services Expected Discharge Plan: [place] Choice: [place] arrangements for the past 2 months: Single Family Home                                      Prior Living Arrangements/Services Living arrangements for the past 2 months: Single Family Home Lives with:: Self Patient language and need for interpreter reviewed:: Yes Do you feel safe going back to the place where you live?: Yes      Need for Family Participation in Patient Care: Yes (Comment)     Criminal Activity/Legal Involvement Pertinent to Current Situation/Hospitalization: No - Comment as needed  Activities of Daily Living Home Assistive Devices/Equipment: None ADL Screening (condition at time of admission) Patient's  cognitive ability adequate to safely complete daily activities?: Yes Is the patient deaf or have difficulty hearing?: No Does the patient have difficulty seeing, even when wearing glasses/contacts?: No Does the patient have difficulty concentrating, remembering, or making decisions?: No Patient able to express need for assistance with ADLs?: Yes Does the patient have difficulty dressing or bathing?: No Independently performs ADLs?: Yes (appropriate for developmental age) Does the patient have difficulty walking or climbing stairs?: No Weakness of Legs: None Weakness of Arms/Hands: None  Permission Sought/Granted Permission sought to share information with : Facility Art therapist granted to share information with : Yes, Verbal Permission Granted     Permission granted to share info w AGENCY: Home health agencies        Emotional Assessment Appearance:: Appears stated age Attitude/Demeanor/Rapport: Engaged, Gracious Affect (typically observed): Accepting, Appropriate, Calm, Pleasant Orientation: : Oriented to Self, Oriented to Place, Oriented to  Time, Oriented to Situation Alcohol / Substance Use: Not Applicable Psych Involvement: No (comment)  Admission diagnosis:  Elevated troponin [R77.8] COPD exacerbation ([place]) [J44.1] Patient Active Problem List   Diagnosis Date Noted  . RSV infection 01/30/2020  . COPD with acute exacerbation ([place]) 01/29/2020  . Elevated troponin 01/29/2020  . Acute on chronic respiratory failure with hypoxia ([place]) 01/29/2020  . COPD exacerbation ([place]) 01/29/2020  . Wound dehiscence 01/21/2020  . Subclavian  arterial stenosis ([place]) 01/01/2020  . Thoracic radiculitis 03/12/2019  . Tendonitis of long head of biceps brachii of right shoulder 02/19/2019  . 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 Arm pain, central, right 02/07/2019  . 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 COPD (chronic obstructive pulmonary disease)  ([place]) 01/08/2019  . Essential hypertension 01/08/2019  . Complication associated with vascular device, sequela 01/08/2019   PCP:  Trinna Post, PA-C Pharmacy:   [facility], [place] [address] Phone: [phone] Fax: [phone]     Social Determinants of Health (SDOH) Interventions    Readmission Risk Interventions No flowsheet data found.

## 2020-02-04 NOTE — Progress Notes (Signed)
OT Cancellation Note  Patient Details Name: Christy Mason MRN: [id number] DOB: [birth year]   Cancelled Treatment:    Reason Eval/Treat Not Completed: Fatigue/lethargy limiting ability to participate. Pt sleeping soundly, does not rouse to gentle verbal or tactile cues. Will re-attempt OT tx at later date/time as appropriate. Left energy conservation handout and will review with pt next session.  Richrd Prime, MPH, MS, OTR/L ascom [phone] 02/04/20, 3:14 PM

## 2020-02-05 DIAGNOSIS — B974 Respiratory syncytial virus as the cause of diseases classified elsewhere: Secondary | ICD-10-CM | POA: Diagnosis not present

## 2020-02-05 DIAGNOSIS — J9621 Acute and chronic respiratory failure with hypoxia: Secondary | ICD-10-CM | POA: Diagnosis not present

## 2020-02-05 DIAGNOSIS — J441 Chronic obstructive pulmonary disease with (acute) exacerbation: Secondary | ICD-10-CM | POA: Diagnosis not present

## 2020-02-05 DIAGNOSIS — I10 Essential (primary) hypertension: Secondary | ICD-10-CM | POA: Diagnosis not present

## 2020-02-05 MED ORDER — IPRATROPIUM-ALBUTEROL 0.5-2.5 (3) MG/3ML IN SOLN
3.0000 mL | Freq: Four times a day (QID) | RESPIRATORY_TRACT | Status: DC
  Administered 2020-02-05 – 2020-02-08 (×9): 3 mL via RESPIRATORY_TRACT
  Filled 2020-02-05 (×9): qty 3

## 2020-02-05 NOTE — Progress Notes (Signed)
Pt did not want to be woke during the night for the Metaneb treatments.

## 2020-02-05 NOTE — Progress Notes (Signed)
PROGRESS NOTE    [name]  JSH:[id number] DOB: [birth year] DOA: 01/29/2020 PCP: [name], PA-C   Brief Narrative: Christy Mason is a 59 y.o. female with a history of COPD, GERD, hypertension, hyperthyroidism, tobacco use. Patient presented secondary to respiratory distress and found to have a COPD exacerbation secondary to RSV infection. Patient started on oxygen therapy in addition to Duoneb treatments and steroids.   Assessment & Plan:   Principal Problem:   COPD with acute exacerbation (HCC) Active Problems:   Essential hypertension   Elevated troponin   Acute on chronic respiratory failure with hypoxia (HCC)   RSV infection   COPD exacerbation Appears to be secondary to RSV infection. Patient reports that her sister also had RSV about one week ago. Patient initially required BiPAP secondary to hypoxia and tachypnea and now tolerating HFNC and down to [place]. She is improving slowly but not close to baseline. Work of breathing much improved -Continue Oxygen supplementation, initially managed on High flow [place] per RRT. Weaning FiO2 and flow rate as able. Wean to room air. -Transition to prednisone PO tomorrow -Continue Duoneb scheduled -Pulmonology recommendations: MetNEB, Mucomyst; discontinued albuterol Tussinex and Delsym  Acute respiratory failure with hypoxia Secondary to above. -Oxygen therapy as above  RSV infection -Supportive care  Essential hypertension Patient is on losartan-hydrochlorothiazide as an outpatient which have been held. BP is more uncontrolled -Continue losartan-hydrochlorothiazide  Elevated troponin Likely demand ischemia. Transthoracic Echocardiogram significant for normal EF.  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 Course complicated by wound dehiscence. -Continue Plavix -Continue wound dressing  Tobacco use -Continue Nicotine patch  DVT prophylaxis: SCDs Code Status:   Code Status: Full Code Family  Communication: None at bedside Disposition Plan: Transfer to medical floor. PT recommendations for placement (currently recommending SNF) but may be able to discharge home with her improvement. Anticipate she will be ready for discharge in 1-2 days   Consultants:   Pulmonology  Procedures:   BiPAP  Antimicrobials:  Azithromycin    Subjective: Continues to feel better. Sat up on the side of the bed.   Objective: Vitals:   02/05/20 0000 02/05/20 0200 02/05/20 0739 02/05/20 0800  BP: (!) 143/81 (!) 143/74  (!) 145/86  Pulse: 85 80 83 82  Resp: 19 20 21 14   Temp: 97.9 F (36.6 C)   97.8 F (36.6 C)  TempSrc: Oral   Oral  SpO2: 95% 97% 98% 97%  Weight:      Height:        Intake/Output Summary (Last 24 hours) at 02/05/2020 0856 Last data filed at 02/04/2020 1924 Gross per 24 hour  Intake 960 ml  Output 1200 ml  Net -240 ml   Filed Weights   01/29/20 1524 01/30/20 0007  Weight: 85.3 kg 82.3 kg    Examination:  General exam: Appears calm and comfortable Respiratory system: Diminished but clear. Respiratory effort normal. Cardiovascular system: S1 & S2 heard, RRR. No murmurs, rubs, gallops or clicks. Gastrointestinal system: Abdomen is nondistended, soft and nontender. No organomegaly or masses felt. Normal bowel sounds heard. Central nervous system: Alert and oriented. No focal neurological deficits. Musculoskeletal: No edema. No calf tenderness Skin: No cyanosis. No rashes Psychiatry: Judgement and insight appear normal. Mood & affect appropriate.     Data Reviewed: I have personally reviewed following labs and imaging studies  CBC Lab Results  Component Value Date   WBC 11.9 (H) 02/04/2020   RBC 4.24 02/04/2020   HGB 12.6 02/04/2020  HCT 38.6 02/04/2020   MCV 91.0 02/04/2020   MCH 29.7 02/04/2020   PLT 213 02/04/2020   MCHC 32.6 02/04/2020   RDW 14.4 02/04/2020   LYMPHSABS 2.3 01/30/2020   MONOABS 0.3 01/30/2020   EOSABS 0.0 01/30/2020   BASOSABS  0.0 01/30/2020     Last metabolic panel Lab Results  Component Value Date   NA 137 02/04/2020   K 4.5 02/04/2020   CL 95 (L) 02/04/2020   CO2 31 02/04/2020   BUN 24 (H) 02/04/2020   CREATININE 0.63 02/04/2020   GLUCOSE 155 (H) 02/04/2020   GFRNONAA >60 02/04/2020   GFRAA >60 02/04/2020   CALCIUM 8.9 02/04/2020   PHOS 4.0 02/04/2020   PROT 6.9 01/30/2020   ALBUMIN 3.1 (L) 01/30/2020   LABGLOB 3.2 10/08/2019   AGRATIO 1.1 (L) 10/08/2019   BILITOT 0.7 01/30/2020   ALKPHOS 102 01/30/2020   AST 39 01/30/2020   ALT 19 01/30/2020   ANIONGAP 11 02/04/2020    CBG (last 3)  No results for input(s): GLUCAP in the last 72 hours.   GFR: Estimated Creatinine Clearance: 73.4 mL/min (by C-G formula based on SCr of 0.63 mg/dL).  Coagulation Profile: No results for input(s): INR, PROTIME in the last 168 hours.  Recent Results (from the past 240 hour(s))  SARS Coronavirus 2 by RT PCR (hospital order, performed in [facility] hospital lab) Nasopharyngeal Nasopharyngeal Swab     Status: None   Collection Time: 01/29/20  3:28 PM   Specimen: Nasopharyngeal Swab  Result Value Ref Range Status   SARS Coronavirus 2 NEGATIVE NEGATIVE Final    Comment: (NOTE) SARS-CoV-2 target nucleic acids are NOT DETECTED.  The SARS-CoV-2 RNA is generally detectable in upper and lower respiratory specimens during the acute phase of infection. The lowest concentration of SARS-CoV-2 viral copies this assay can detect is 250 copies / mL. A negative result does not preclude SARS-CoV-2 infection and should not be used as the sole basis for treatment or other patient management decisions.  A negative result may occur with improper specimen collection / handling, submission of specimen other than nasopharyngeal swab, presence of viral mutation(s) within the areas targeted by this assay, and inadequate number of viral copies (<250 copies / mL). A negative result must be combined with clinical observations,  patient history, and epidemiological information.  Fact Sheet for Patients:   BoilerBrush.com.cy  Fact Sheet for Healthcare Providers: https://pope.com/  This test is not yet approved or  cleared by the [place] FDA and has been authorized for detection and/or diagnosis of SARS-CoV-2 by FDA under an Emergency Use Authorization (EUA).  This EUA will remain in effect (meaning this test can be used) for the duration of the COVID-19 declaration under Section 564(b)(1) of the Act, 21 U.S.C. section 360bbb-3(b)(1), unless the authorization is terminated or revoked sooner.  Performed at [facility], [address] [place], [place] [address]   Respiratory Panel by PCR     Status: Abnormal   Collection Time: 01/30/20 12:12 AM   Specimen: Nasopharyngeal Swab; Respiratory  Result Value Ref Range Status   Adenovirus NOT DETECTED NOT DETECTED Final   Coronavirus 229E NOT DETECTED NOT DETECTED Final    Comment: (NOTE) The Coronavirus on the Respiratory Panel, DOES NOT test for the novel  Coronavirus (2019 nCoV)    Coronavirus HKU1 NOT DETECTED NOT DETECTED Final   Coronavirus NL63 NOT DETECTED NOT DETECTED Final   Coronavirus OC43 NOT DETECTED NOT DETECTED Final   Metapneumovirus NOT  DETECTED NOT DETECTED Final   Rhinovirus / Enterovirus NOT DETECTED NOT DETECTED Final   Influenza A NOT DETECTED NOT DETECTED Final   Influenza B NOT DETECTED NOT DETECTED Final   Parainfluenza Virus 1 NOT DETECTED NOT DETECTED Final   Parainfluenza Virus 2 NOT DETECTED NOT DETECTED Final   Parainfluenza Virus 3 NOT DETECTED NOT DETECTED Final   Parainfluenza Virus 4 NOT DETECTED NOT DETECTED Final   Respiratory Syncytial Virus DETECTED (A) NOT DETECTED Final   Bordetella pertussis NOT DETECTED NOT DETECTED Final   Chlamydophila pneumoniae NOT DETECTED NOT DETECTED Final   Mycoplasma pneumoniae NOT DETECTED NOT DETECTED Final    Comment:  Performed at [facility], [address] [place], [place] [address]  MRSA PCR Screening     Status: None   Collection Time: 01/30/20 12:12 AM   Specimen: Nasopharyngeal  Result Value Ref Range Status   MRSA by PCR NEGATIVE NEGATIVE Final    Comment:        The GeneXpert MRSA Assay (FDA approved for NASAL specimens only), is one component of a comprehensive MRSA colonization surveillance program. It is not intended to diagnose MRSA infection nor to guide or monitor treatment for MRSA infections. Performed at [facility], [address], [place], [place] [address]         Radiology Studies: No results found.      Scheduled Meds: . acetylcysteine  4 mL Nebulization BID  . ALPRAZolam  0.25 mg Oral QHS  . aspirin EC  81 mg Oral Daily  . buprenorphine-naloxone  2 tablet Sublingual BID   Or  . buprenorphine-naloxone  1 tablet Sublingual BID  . chlorhexidine  15 mL Mouth Rinse BID  . Chlorhexidine Gluconate Cloth  6 each Topical Daily  . clopidogrel  75 mg Oral Daily  . docusate sodium  100 mg Oral BID  . losartan  50 mg Oral Daily   And  . hydrochlorothiazide  12.5 mg Oral Daily  . ipratropium-albuterol  3 mL Nebulization QID  . loratadine  10 mg Oral Daily  . mouth rinse  15 mL Mouth Rinse q12n4p  . methylPREDNISolone (SOLU-MEDROL) injection  40 mg Intravenous Q12H  . mometasone-formoterol  1 puff Inhalation BID  . nicotine  21 mg Transdermal Daily  . silver sulfADIAZINE   Topical BID  . sodium chloride flush  3 mL Intravenous Q12H  . sodium chloride flush  3 mL Intravenous Q12H   Continuous Infusions:    LOS: 7 days     Jacquelin Hawking, MD [facility] 02/05/2020, 8:56 AM  If 7PM-7AM, please contact night-coverage [email or web address]
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
## 2020-02-05 NOTE — Progress Notes (Signed)
Patient remains AOx4.  Vitals WDL.  Her lung sounds are somewhat course in the upper lobes.  She has a strong cough.  PT evaluated patient today by assisting with ambulation in the room.  Patient moved from bed to the chair for half the day shift and then she independently ambulated back to the bed.  Patient consumed her meals roughly 80%. Called report to [place] on 1C.

## 2020-02-06 DIAGNOSIS — J441 Chronic obstructive pulmonary disease with (acute) exacerbation: Secondary | ICD-10-CM | POA: Diagnosis not present

## 2020-02-06 LAB — BASIC METABOLIC PANEL
Anion gap: 9 (ref 5–15)
BUN: 28 mg/dL — ABNORMAL HIGH (ref 8–23)
CO2: 29 mmol/L (ref 22–32)
Calcium: 9.2 mg/dL (ref 8.9–10.3)
Chloride: 95 mmol/L — ABNORMAL LOW (ref 98–111)
Creatinine, Ser: 0.74 mg/dL (ref 0.44–1.00)
GFR calc Af Amer: >60 mL/min (ref >60)
GFR calc non Af Amer: >60 mL/min (ref >60)
Glucose, Bld: 195 mg/dL — ABNORMAL HIGH (ref 70–99)
Potassium: 4.7 mmol/L (ref 3.5–5.1)
Sodium: 133 mmol/L — ABNORMAL LOW (ref 135–145)

## 2020-02-06 LAB — CBC
HCT: 40.8 % (ref 36.0–46.0)
Hemoglobin: 14.2 g/dL (ref 12.0–15.0)
MCH: 30.1 pg (ref 26.0–34.0)
MCHC: 34.8 g/dL (ref 30.0–36.0)
MCV: 86.6 fL (ref 80.0–100.0)
Platelets: 252 10*3/uL (ref 150–400)
RBC: 4.71 MIL/uL (ref 3.87–5.11)
RDW: 14.6 % (ref 11.5–15.5)
WBC: 17.8 10*3/uL — ABNORMAL HIGH (ref 4.0–10.5)
nRBC: 0 % (ref 0.0–0.2)

## 2020-02-06 MED ORDER — METHYLPREDNISOLONE SODIUM SUCC 40 MG IJ SOLR
40.0000 mg | Freq: Every day | INTRAMUSCULAR | Status: DC
  Administered 2020-02-07: 12:00:00 40 mg via INTRAVENOUS
  Filled 2020-02-06: qty 1

## 2020-02-06 NOTE — Progress Notes (Addendum)
PROGRESS NOTE    LORIEANN ARGUETA  MVH:[id number] DOB: [birth year] DOA: 01/29/2020 PCP: [name], PA-C   Brief Narrative: Christy Mason is a 59 y.o. female with a history of COPD, GERD, hypertension, hyperthyroidism, tobacco use. Patient presented secondary to respiratory distress and found to have a COPD exacerbation secondary to RSV infection. Patient started on oxygen therapy in addition to Duoneb treatments and steroids.   Assessment & Plan:   Principal Problem:   COPD with acute exacerbation (HCC) Active Problems:   Essential hypertension   Elevated troponin   Acute on chronic respiratory failure with hypoxia (HCC)   RSV infection   COPD exacerbation Appears to be secondary to RSV infection. Patient reports that her sister also had RSV about one week ago. Patient initially required BiPAP secondary to hypoxia and tachypnea and now tolerating HFNC and down to [place]. She is improving slowly but not close to baseline. Work of breathing much improved -Continue Oxygen supplementation, initially managed on High flow Magna per RRT. Weaning FiO2 and flow rate as able. Wean to room air. -Taper Solu-Medrol -Continue Duoneb scheduled -Pulmonology recommendations: MetNEB, Mucomyst; discontinued albuterol Tussinex and Delsym White count trending up patient is also on steroids but overall she is feeling better than yesterday however not yet back to baseline.  Acute respiratory failure with hypoxia Secondary to above. -Oxygen therapy as above  RSV infection -Supportive care  Essential hypertension-blood pressures 121/74 on losartan HCTZ.  Elevated troponin Likely demand ischemia. Transthoracic Echocardiogram significant for normal EF.  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 Course complicated by wound dehiscence. -Continue Plavix -Continue wound dressing  Tobacco use -Continue Nicotine patch  DVT prophylaxis: SCDs Code Status:   Code Status: Full  Code Family Communication: None at bedside Disposition Plan: Transfer to medical floor. PT recommendations for placement (currently recommending SNF) but may be able to discharge home with her improvement. Anticipate she will be ready for discharge in 1-2 days   Consultants:   Pulmonology  Procedures:   BiPAP  Antimicrobials:  Azithromycin    Subjective: Resting in bed reports that she is better than when she came in however not back to baseline.  Continues to feel short winded with exertion.  On 3 L of oxygen 100% saturation.  Objective: Vitals:   02/06/20 0421 02/06/20 0756 02/06/20 0927 02/06/20 1203  BP: 119/71 119/78  121/74  Pulse: 79 78  93  Resp: 17 17  17   Temp: 97.8 F (36.6 C) 98 F (36.7 C)  98.4 F (36.9 C)  TempSrc: Oral Oral  Oral  SpO2: 100% 99% 98% 100%  Weight:      Height:        Intake/Output Summary (Last 24 hours) at 02/06/2020 1416 Last data filed at 02/06/2020 0422 Gross per 24 hour  Intake --  Output 650 ml  Net -650 ml   Filed Weights   01/29/20 1524 01/30/20 0007  Weight: 85.3 kg 82.3 kg    Examination:  General exam: Appears calm and comfortable Respiratory system: Coarse breath sounds bilaterally Respiratory effort normal. Cardiovascular system: S1 & S2 heard, RRR. No murmurs, rubs, gallops or clicks. Gastrointestinal system: Abdomen is nondistended, soft and nontender. No organomegaly or masses felt. Normal bowel sounds heard. Central nervous system: Alert and oriented. No focal neurological deficits. Musculoskeletal: No edema. No calf tenderness Skin: No cyanosis. No rashes Psychiatry: Judgement and insight appear normal. Mood & affect appropriate.     Data Reviewed: I have personally reviewed following labs  and imaging studies  CBC Lab Results  Component Value Date   WBC 17.8 (H) 02/06/2020   RBC 4.71 02/06/2020   HGB 14.2 02/06/2020   HCT 40.8 02/06/2020   MCV 86.6 02/06/2020   MCH 30.1 02/06/2020   PLT 252  02/06/2020   MCHC 34.8 02/06/2020   RDW 14.6 02/06/2020   LYMPHSABS 2.3 01/30/2020   MONOABS 0.3 01/30/2020   EOSABS 0.0 01/30/2020   BASOSABS 0.0 01/30/2020     Last metabolic panel Lab Results  Component Value Date   NA 133 (L) 02/06/2020   K 4.7 02/06/2020   CL 95 (L) 02/06/2020   CO2 29 02/06/2020   BUN 28 (H) 02/06/2020   CREATININE 0.74 02/06/2020   GLUCOSE 195 (H) 02/06/2020   GFRNONAA >60 02/06/2020   GFRAA >60 02/06/2020   CALCIUM 9.2 02/06/2020   PHOS 4.0 02/04/2020   PROT 6.9 01/30/2020   ALBUMIN 3.1 (L) 01/30/2020   LABGLOB 3.2 10/08/2019   AGRATIO 1.1 (L) 10/08/2019   BILITOT 0.7 01/30/2020   ALKPHOS 102 01/30/2020   AST 39 01/30/2020   ALT 19 01/30/2020   ANIONGAP 9 02/06/2020    CBG (last 3)  No results for input(s): GLUCAP in the last 72 hours.   GFR: Estimated Creatinine Clearance: 73.4 mL/min (by C-G formula based on SCr of 0.74 mg/dL).  Coagulation Profile: No results for input(s): INR, PROTIME in the last 168 hours.  Recent Results (from the past 240 hour(s))  SARS Coronavirus 2 by RT PCR (hospital order, performed in [facility] hospital lab) Nasopharyngeal Nasopharyngeal Swab     Status: None   Collection Time: 01/29/20  3:28 PM   Specimen: Nasopharyngeal Swab  Result Value Ref Range Status   SARS Coronavirus 2 NEGATIVE NEGATIVE Final    Comment: (NOTE) SARS-CoV-2 target nucleic acids are NOT DETECTED.  The SARS-CoV-2 RNA is generally detectable in upper and lower respiratory specimens during the acute phase of infection. The lowest concentration of SARS-CoV-2 viral copies this assay can detect is 250 copies / mL. A negative result does not preclude SARS-CoV-2 infection and should not be used as the sole basis for treatment or other patient management decisions.  A negative result may occur with improper specimen collection / handling, submission of specimen other than nasopharyngeal swab, presence of viral mutation(s) within  the areas targeted by this assay, and inadequate number of viral copies (<250 copies / mL). A negative result must be combined with clinical observations, patient history, and epidemiological information.  Fact Sheet for Patients:   BoilerBrush.com.cy  Fact Sheet for Healthcare Providers: https://pope.com/  This test is not yet approved or  cleared by the [place] FDA and has been authorized for detection and/or diagnosis of SARS-CoV-2 by FDA under an Emergency Use Authorization (EUA).  This EUA will remain in effect (meaning this test can be used) for the duration of the COVID-19 declaration under Section 564(b)(1) of the Act, 21 U.S.C. section 360bbb-3(b)(1), unless the authorization is terminated or revoked sooner.  Performed at [facility], [address] [place], [place] [address]   Respiratory Panel by PCR     Status: Abnormal   Collection Time: 01/30/20 12:12 AM   Specimen: Nasopharyngeal Swab; Respiratory  Result Value Ref Range Status   Adenovirus NOT DETECTED NOT DETECTED Final   Coronavirus 229E NOT DETECTED NOT DETECTED Final    Comment: (NOTE) The Coronavirus on the Respiratory Panel, DOES NOT test for the novel  Coronavirus (2019 nCoV)  Coronavirus HKU1 NOT DETECTED NOT DETECTED Final   Coronavirus NL63 NOT DETECTED NOT DETECTED Final   Coronavirus OC43 NOT DETECTED NOT DETECTED Final   Metapneumovirus NOT DETECTED NOT DETECTED Final   Rhinovirus / Enterovirus NOT DETECTED NOT DETECTED Final   Influenza A NOT DETECTED NOT DETECTED Final   Influenza B NOT DETECTED NOT DETECTED Final   Parainfluenza Virus 1 NOT DETECTED NOT DETECTED Final   Parainfluenza Virus 2 NOT DETECTED NOT DETECTED Final   Parainfluenza Virus 3 NOT DETECTED NOT DETECTED Final   Parainfluenza Virus 4 NOT DETECTED NOT DETECTED Final   Respiratory Syncytial Virus DETECTED (A) NOT DETECTED Final   Bordetella pertussis NOT  DETECTED NOT DETECTED Final   Chlamydophila pneumoniae NOT DETECTED NOT DETECTED Final   Mycoplasma pneumoniae NOT DETECTED NOT DETECTED Final    Comment: Performed at [facility], [address] [place], [place] [address]  MRSA PCR Screening     Status: None   Collection Time: 01/30/20 12:12 AM   Specimen: Nasopharyngeal  Result Value Ref Range Status   MRSA by PCR NEGATIVE NEGATIVE Final    Comment:        The GeneXpert MRSA Assay (FDA approved for NASAL specimens only), is one component of a comprehensive MRSA colonization surveillance program. It is not intended to diagnose MRSA infection nor to guide or monitor treatment for MRSA infections. Performed at [facility], [address] [place], [place] [address]         Radiology Studies: No results found.      Scheduled Meds: . ALPRAZolam  0.25 mg Oral QHS  . aspirin EC  81 mg Oral Daily  . buprenorphine-naloxone  2 tablet Sublingual BID   Or  . buprenorphine-naloxone  1 tablet Sublingual BID  . chlorhexidine  15 mL Mouth Rinse BID  . Chlorhexidine Gluconate Cloth  6 each Topical Daily  . clopidogrel  75 mg Oral Daily  . docusate sodium  100 mg Oral BID  . losartan  50 mg Oral Daily   And  . hydrochlorothiazide  12.5 mg Oral Daily  . ipratropium-albuterol  3 mL Nebulization Q6H  . loratadine  10 mg Oral Daily  . mouth rinse  15 mL Mouth Rinse q12n4p  . methylPREDNISolone (SOLU-MEDROL) injection  40 mg Intravenous Q12H  . mometasone-formoterol  1 puff Inhalation BID  . nicotine  21 mg Transdermal Daily  . silver sulfADIAZINE   Topical BID  . sodium chloride flush  3 mL Intravenous Q12H  . sodium chloride flush  3 mL Intravenous Q12H   Continuous Infusions:    LOS: 8 days     Jerelene Redden  MD [facility] 02/06/2020, 2:16 PM  If 7PM-7AM, please contact night-coverage [email or web address]
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
## 2020-02-07 DIAGNOSIS — J441 Chronic obstructive pulmonary disease with (acute) exacerbation: Secondary | ICD-10-CM | POA: Diagnosis not present

## 2020-02-07 LAB — BLOOD GAS, ARTERIAL
Acid-base deficit: 1.7 mmol/L (ref 0.0–2.0)
Bicarbonate: 21.9 mmol/L (ref 20.0–28.0)
Expiratory PAP: 8
FIO2: 30
Inspiratory PAP: 16
O2 Saturation: 98.7 %
Patient temperature: 37
pCO2 arterial: 33 mmHg (ref 32.0–48.0)
pH, Arterial: 7.43 (ref 7.350–7.450)
pO2, Arterial: 119 mmHg — ABNORMAL HIGH (ref 83.0–108.0)

## 2020-02-07 NOTE — Progress Notes (Addendum)
PROGRESS NOTE    Christy Mason  GMW:[id number] DOB: August 08, 1960 DOA: 01/29/2020 PCP: [name], PA-C   Brief Narrative: Christy Mason is a 59 y.o. female with a history of COPD, GERD, hypertension, hyperthyroidism, tobacco use. Patient presented secondary to respiratory distress and found to have a COPD exacerbation secondary to RSV infection. Patient started on oxygen therapy in addition to Duoneb treatments and steroids.   Assessment & Plan:   Principal Problem:   COPD with acute exacerbation (HCC) Active Problems:   Essential hypertension   Elevated troponin   Acute on chronic respiratory failure with hypoxia (HCC)   RSV infection   COPD exacerbation Appears to be secondary to RSV infection. Patient reports that her sister also had RSV about one week ago. Patient initially required BiPAP secondary to hypoxia and tachypnea and now tolerating HFNC and down to [place]. She is improving slowly but not close to baseline. Work of breathing much improved -Continue Oxygen supplementation, initially managed on High flow [place] per RRT. Weaning FiO2 and flow rate as able. Wean to room air. -Taper Solu-Medrol -Continue Duoneb scheduled -Pulmonology recommendations: MetNEB, Mucomyst; discontinued albuterol Tussinex and Delsym White count trending up patient is also on steroids but overall she is feeling better than yesterday however not yet back to baseline.  She would like to stay another day and plan for discharge in a.m.  Still feels very weak in her body and wants to go home and not to rehab. WBCs trending up follow-up labs  Acute respiratory failure with hypoxia Secondary to above. -Oxygen therapy as above  RSV infection -Supportive care  Essential hypertension-blood pressures 124/101 continue HCTZ and losartan.    Elevated troponin Likely demand ischemia. Transthoracic Echocardiogram significant for normal EF.  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 Course  complicated by wound dehiscence. -Continue Plavix -Continue wound dressing  Tobacco use -Continue Nicotine patch  DVT prophylaxis: SCDs Code Status:   Code Status: Full Code Family Communication: None at bedside Disposition Plan: Plan for discharge home in a.m.   Consultants:   Pulmonology  Procedures:   BiPAP  Antimicrobials:  Azithromycin    Subjective: Resting in bed reports that she is better than when she came in however not back to baseline.  Continues to feel short winded with exertion.  On 3 L of oxygen 100% saturation.  Objective: Vitals:   02/07/20 0427 02/07/20 0748 02/07/20 0816 02/07/20 1232  BP: 100/65  116/74 (!) 124/101  Pulse: 80  95 98  Resp: 18  12 14   Temp: 97.7 F (36.5 C)  97.9 F (36.6 C) 98.7 F (37.1 C)  TempSrc: Oral  Oral Oral  SpO2: 99% 97% 100% 96%  Weight:      Height:        Intake/Output Summary (Last 24 hours) at 02/07/2020 1244 Last data filed at 02/07/2020 0816 Gross per 24 hour  Intake --  Output 1350 ml  Net -1350 ml   Filed Weights   01/29/20 1524 01/30/20 0007  Weight: 85.3 kg 82.3 kg    Examination:  General exam: Appears calm and comfortable Respiratory system: Coarse breath sounds bilaterally Respiratory effort normal. Cardiovascular system: S1 & S2 heard, RRR. No murmurs, rubs, gallops or clicks. Gastrointestinal system: Abdomen is nondistended, soft and nontender. No organomegaly or masses felt. Normal bowel sounds heard. Central nervous system: Alert and oriented. No focal neurological deficits. Musculoskeletal: No edema. No calf tenderness Skin: No cyanosis. No rashes Psychiatry: Judgement and insight appear normal. Mood &  affect appropriate.     Data Reviewed: I have personally reviewed following labs and imaging studies  CBC Lab Results  Component Value Date   WBC 17.8 (H) 02/06/2020   RBC 4.71 02/06/2020   HGB 14.2 02/06/2020   HCT 40.8 02/06/2020   MCV 86.6 02/06/2020   MCH 30.1 02/06/2020    PLT 252 02/06/2020   MCHC 34.8 02/06/2020   RDW 14.6 02/06/2020   LYMPHSABS 2.3 01/30/2020   MONOABS 0.3 01/30/2020   EOSABS 0.0 01/30/2020   BASOSABS 0.0 01/30/2020     Last metabolic panel Lab Results  Component Value Date   NA 133 (L) 02/06/2020   K 4.7 02/06/2020   CL 95 (L) 02/06/2020   CO2 29 02/06/2020   BUN 28 (H) 02/06/2020   CREATININE 0.74 02/06/2020   GLUCOSE 195 (H) 02/06/2020   GFRNONAA >60 02/06/2020   GFRAA >60 02/06/2020   CALCIUM 9.2 02/06/2020   PHOS 4.0 02/04/2020   PROT 6.9 01/30/2020   ALBUMIN 3.1 (L) 01/30/2020   LABGLOB 3.2 10/08/2019   AGRATIO 1.1 (L) 10/08/2019   BILITOT 0.7 01/30/2020   ALKPHOS 102 01/30/2020   AST 39 01/30/2020   ALT 19 01/30/2020   ANIONGAP 9 02/06/2020    CBG (last 3)  No results for input(s): GLUCAP in the last 72 hours.   GFR: Estimated Creatinine Clearance: 73.4 mL/min (by C-G formula based on SCr of 0.74 mg/dL).  Coagulation Profile: No results for input(s): INR, PROTIME in the last 168 hours.  Recent Results (from the past 240 hour(s))  SARS Coronavirus 2 by RT PCR (hospital order, performed in [facility] hospital lab) Nasopharyngeal Nasopharyngeal Swab     Status: None   Collection Time: 01/29/20  3:28 PM   Specimen: Nasopharyngeal Swab  Result Value Ref Range Status   SARS Coronavirus 2 NEGATIVE NEGATIVE Final    Comment: (NOTE) SARS-CoV-2 target nucleic acids are NOT DETECTED.  The SARS-CoV-2 RNA is generally detectable in upper and lower respiratory specimens during the acute phase of infection. The lowest concentration of SARS-CoV-2 viral copies this assay can detect is 250 copies / mL. A negative result does not preclude SARS-CoV-2 infection and should not be used as the sole basis for treatment or other patient management decisions.  A negative result may occur with improper specimen collection / handling, submission of specimen other than nasopharyngeal swab, presence of viral mutation(s)  within the areas targeted by this assay, and inadequate number of viral copies (<250 copies / mL). A negative result must be combined with clinical observations, patient history, and epidemiological information.  Fact Sheet for Patients:   BoilerBrush.com.cy  Fact Sheet for Healthcare Providers: https://pope.com/  This test is not yet approved or  cleared by the [place] FDA and has been authorized for detection and/or diagnosis of SARS-CoV-2 by FDA under an Emergency Use Authorization (EUA).  This EUA will remain in effect (meaning this test can be used) for the duration of the COVID-19 declaration under Section 564(b)(1) of the Act, 21 U.S.C. section 360bbb-3(b)(1), unless the authorization is terminated or revoked sooner.  Performed at [facility], [address] [place], [place] [address]   Respiratory Panel by PCR     Status: Abnormal   Collection Time: 01/30/20 12:12 AM   Specimen: Nasopharyngeal Swab; Respiratory  Result Value Ref Range Status   Adenovirus NOT DETECTED NOT DETECTED Final   Coronavirus 229E NOT DETECTED NOT DETECTED Final    Comment: (NOTE) The Coronavirus on the  Respiratory Panel, DOES NOT test for the novel  Coronavirus (2019 nCoV)    Coronavirus HKU1 NOT DETECTED NOT DETECTED Final   Coronavirus NL63 NOT DETECTED NOT DETECTED Final   Coronavirus OC43 NOT DETECTED NOT DETECTED Final   Metapneumovirus NOT DETECTED NOT DETECTED Final   Rhinovirus / Enterovirus NOT DETECTED NOT DETECTED Final   Influenza A NOT DETECTED NOT DETECTED Final   Influenza B NOT DETECTED NOT DETECTED Final   Parainfluenza Virus 1 NOT DETECTED NOT DETECTED Final   Parainfluenza Virus 2 NOT DETECTED NOT DETECTED Final   Parainfluenza Virus 3 NOT DETECTED NOT DETECTED Final   Parainfluenza Virus 4 NOT DETECTED NOT DETECTED Final   Respiratory Syncytial Virus DETECTED (A) NOT DETECTED Final   Bordetella pertussis  NOT DETECTED NOT DETECTED Final   Chlamydophila pneumoniae NOT DETECTED NOT DETECTED Final   Mycoplasma pneumoniae NOT DETECTED NOT DETECTED Final    Comment: Performed at [facility], [address] [place], [place] [address]  MRSA PCR Screening     Status: None   Collection Time: 01/30/20 12:12 AM   Specimen: Nasopharyngeal  Result Value Ref Range Status   MRSA by PCR NEGATIVE NEGATIVE Final    Comment:        The GeneXpert MRSA Assay (FDA approved for NASAL specimens only), is one component of a comprehensive MRSA colonization surveillance program. It is not intended to diagnose MRSA infection nor to guide or monitor treatment for MRSA infections. Performed at [facility], [address], [place], [place] [address]         Radiology Studies: No results found.      Scheduled Meds: . ALPRAZolam  0.25 mg Oral QHS  . aspirin EC  81 mg Oral Daily  . buprenorphine-naloxone  2 tablet Sublingual BID   Or  . buprenorphine-naloxone  1 tablet Sublingual BID  . chlorhexidine  15 mL Mouth Rinse BID  . Chlorhexidine Gluconate Cloth  6 each Topical Daily  . clopidogrel  75 mg Oral Daily  . docusate sodium  100 mg Oral BID  . losartan  50 mg Oral Daily   And  . hydrochlorothiazide  12.5 mg Oral Daily  . ipratropium-albuterol  3 mL Nebulization Q6H  . loratadine  10 mg Oral Daily  . mouth rinse  15 mL Mouth Rinse q12n4p  . methylPREDNISolone (SOLU-MEDROL) injection  40 mg Intravenous Daily  . mometasone-formoterol  1 puff Inhalation BID  . nicotine  21 mg Transdermal Daily  . silver sulfADIAZINE   Topical BID  . sodium chloride flush  3 mL Intravenous Q12H  . sodium chloride flush  3 mL Intravenous Q12H   Continuous Infusions:    LOS: 9 days     Jerelene Redden MD [facility] 02/07/2020, 12:44 PM  If 7PM-7AM, please contact night-coverage [email or web address]

## 2020-02-08 LAB — CBC
HCT: 36.8 % (ref 36.0–46.0)
Hemoglobin: 12.1 g/dL (ref 12.0–15.0)
MCH: 29.9 pg (ref 26.0–34.0)
MCHC: 32.9 g/dL (ref 30.0–36.0)
MCV: 90.9 fL (ref 80.0–100.0)
Platelets: 205 10*3/uL (ref 150–400)
RBC: 4.05 MIL/uL (ref 3.87–5.11)
RDW: 14.5 % (ref 11.5–15.5)
WBC: 15.8 10*3/uL — ABNORMAL HIGH (ref 4.0–10.5)
nRBC: 0 % (ref 0.0–0.2)

## 2020-02-08 LAB — BASIC METABOLIC PANEL
Anion gap: 9 (ref 5–15)
BUN: 29 mg/dL — ABNORMAL HIGH (ref 8–23)
CO2: 28 mmol/L (ref 22–32)
Calcium: 9 mg/dL (ref 8.9–10.3)
Chloride: 97 mmol/L — ABNORMAL LOW (ref 98–111)
Creatinine, Ser: 0.68 mg/dL (ref 0.44–1.00)
GFR calc Af Amer: >60 mL/min (ref >60)
GFR calc non Af Amer: >60 mL/min (ref >60)
Glucose, Bld: 130 mg/dL — ABNORMAL HIGH (ref 70–99)
Potassium: 4.2 mmol/L (ref 3.5–5.1)
Sodium: 134 mmol/L — ABNORMAL LOW (ref 135–145)

## 2020-02-08 MED ORDER — PREDNISONE 10 MG PO TABS
ORAL_TABLET | ORAL | 0 refills | Status: DC
Start: 2020-02-08 — End: 2020-07-30

## 2020-02-08 MED ORDER — HYDROCOD POLST-CPM POLST ER 10-8 MG/5ML PO SUER: 5.0000 mL | Freq: Two times a day (BID) | ORAL | 0 refills | Status: DC | PRN

## 2020-02-08 MED ORDER — ONDANSETRON HCL 4 MG PO TABS: 4.0000 mg | ORAL_TABLET | Freq: Four times a day (QID) | ORAL | 0 refills | Status: DC | PRN

## 2020-02-08 NOTE — Progress Notes (Signed)
SATURATION QUALIFICATIONS: (This note is used to comply with regulatory documentation for home oxygen)  Patient Saturations on Room Air at Rest = 97%  Patient Saturations on Room Air while Ambulating = 90%  Patient Saturations on 0 Liters of oxygen while Ambulating = %  Please briefly explain why patient needs home oxygen:Pt Ambulated in [place] Apprx: 150 ft. Tolerated well on RA

## 2020-02-08 NOTE — Discharge Summary (Signed)
Physician Discharge Summary  VEVA GRIMLEY NWG:[id number] DOB: 06-26-1961 DOA: 01/29/2020  PCP: [name], PA-C  Admit date: 01/29/2020 Discharge date: 02/08/2020  Admitted From: home Disposition: home Recommendations for Outpatient Follow-up:  1. Follow up with PCP in 1-2 weeks 2. Please obtain BMP/CBC in one week 3. Med change losartan hctz stopped due to soft bp while in hospital  Home Healthpt Equipment/Devicesnone  Discharge Condition:stable CODE STATUS: full Diet recommendation: cardiac Brief/Interim Summary:Rennie Judie Petit Rummell is a 59 y.o. female with a history of COPD, GERD, hypertension, hyperthyroidism, tobacco use. Patient presented secondary to respiratory distress and found to have a COPD exacerbation secondary to RSV infection. Patient started on oxygen therapy in addition to Duoneb treatments and steroids.    Discharge Diagnoses:  Principal Problem:   COPD with acute exacerbation (HCC) Active Problems:   Essential hypertension   Elevated troponin   Acute on chronic respiratory failure with hypoxia (HCC)   RSV infection  COPD exacerbation  secondary to RSV infection. Patient reports that her sister also had RSV about one week ago. Patient initially required BiPAP secondary to hypoxia and tachypnea and then tolerated  HFNC and down to [place].  -Pulmonology recommendations: MetNEB, Mucomyst; discontinued albuterol Tussinex and Delsym Ambulatory oxygen saturation pending  Acute respiratory failure with hypoxia Secondary to above.  RSV infection -Supportive care offered   Essential hypertension-blood pressures  Too low to continue losartan and hctz.  Elevated troponin Likely demand ischemia. Transthoracic Echocardiogram significant for normal EF.  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 Course complicated by wound dehiscence. -Continue Plavix -Continue wound dressing  Tobacco use -Continue Nicotine patch   Estimated body mass  index is 33.19 kg/m as calculated from the following:   Height as of this encounter: 5\' 2"  (1.575 m).   Weight as of this encounter: 82.3 kg.  Discharge Instructions  Discharge Instructions    Call MD for:  difficulty breathing, headache or visual disturbances   Complete by: As directed    Call MD for:  temperature >100.4   Complete by: As directed    Diet - low sodium heart healthy   Complete by: As directed    Increase activity slowly   Complete by: As directed    No wound care   Complete by: As directed      Allergies as of 02/08/2020      Reactions   Acetaminophen    Avoid due to fatty liver    Nsaids Other (See Comments)   irritates stomach severley    Other    Steroids-muscle weakness and headache   Sular [nisoldipine Er] Palpitations      Medication List    STOP taking these medications   BC FAST PAIN RELIEF PO     TAKE these medications   Advair Diskus 250-50 MCG/DOSE Aepb Generic drug: Fluticasone-Salmeterol INHALE 1 PUFF INTO THE LUNGS TWICE DAILY What changed: See the new instructions.   albuterol (2.5 MG/3ML) 0.083% nebulizer solution Commonly known as: PROVENTIL Take 3 mLs (2.5 mg total) by nebulization every 4 (four) hours as needed for wheezing or shortness of breath. What changed: Another medication with the same name was changed. Make sure you understand how and when to take each.   ProAir HFA 108 (90 Base) MCG/ACT inhaler Generic drug: albuterol INHALE 2 PUFFS INTO THE LUNGS EVERY 6 HOURS AS NEEDED FOR WHEEZING OR SHORTNESS OF BREATH What changed: See the new instructions.   aspirin EC 81 MG tablet Take 81 mg by mouth  daily.   cetirizine 10 MG tablet Commonly known as: ZYRTEC Take 10 mg by mouth daily.   chlorpheniramine-HYDROcodone 10-8 MG/5ML Suer Commonly known as: TUSSIONEX Take 5 mLs by mouth every 12 (twelve) hours as needed for cough.   cholecalciferol 25 MCG (1000 UNIT) tablet Commonly known as: VITAMIN D3 Take 1,000 Units by  mouth daily.   clopidogrel 75 MG tablet Commonly known as: PLAVIX Take 1 tablet (75 mg total) by mouth daily.   losartan-hydrochlorothiazide 50-12.5 MG tablet Commonly known as: HYZAAR Take 1 tablet by mouth daily.   multivitamin with minerals Tabs tablet Take 1 tablet by mouth daily.   Nasacort Allergy 24HR 55 MCG/ACT Aero nasal inhaler Generic drug: triamcinolone Place 1 spray into the nose daily as needed (allergies).   nicotine 21 mg/24hr patch Commonly known as: Nicoderm CQ Place 1 patch (21 mg total) onto the skin daily.   ondansetron 4 MG tablet Commonly known as: ZOFRAN Take 1 tablet (4 mg total) by mouth every 6 (six) hours as needed for nausea.   oxyCODONE 5 MG immediate release tablet Commonly known as: Oxy IR/ROXICODONE Take 1 tablet (5 mg total) by mouth every 6 (six) hours as needed for severe pain (headache).   silver sulfADIAZINE 1 % cream Commonly known as: SILVADENE Apply 1 application topically daily.   Spiriva HandiHaler 18 MCG inhalation capsule Generic drug: tiotropium INHALE CONTENTS OF 1 CAPSULE ONCE DAILY USING HANDIHALER What changed: See the new instructions.   Suboxone 8-2 MG Film Generic drug: Buprenorphine HCl-Naloxone HCl Take 0.5-1 Film by mouth in the morning and at bedtime.   ZzzQuil 50 MG/30ML Liqd Generic drug: diphenhydrAMINE HCl Take 50 mg by mouth at bedtime.       Follow-up Information    [name], PA-C Follow up.   Specialty: Physician Assistant Contact information: [address] [employer] [address] [place] [address] [phone]              Allergies  Allergen Reactions  . Acetaminophen     Avoid due to fatty liver   . Nsaids Other (See Comments)    irritates stomach severley   . Other     Steroids-muscle weakness and headache  . Sular [Nisoldipine Er] Palpitations    Consultations:  pccm   Procedures/Studies: CT Angio Chest PE W and/or Wo Contrast  Result Date: 01/29/2020 CLINICAL DATA:   Dyspnea, hypoxia, COPD EXAM: CT ANGIOGRAPHY CHEST WITH CONTRAST TECHNIQUE: Multidetector CT imaging of the chest was performed using the standard protocol during bolus administration of intravenous contrast. Multiplanar CT image reconstructions and MIPs were obtained to evaluate the vascular anatomy. CONTRAST:  75mL OMNIPAQUE IOHEXOL 350 MG/ML SOLN COMPARISON:  05/31/2019 FINDINGS: Cardiovascular: There is adequate opacification of the pulmonary arterial tree. No intraluminal filling defect is seen to 6 chest acute pulmonary embolism. The central pulmonary arteries are of normal caliber. No significant coronary artery calcification. Mild left ventricular hypertrophy. Global cardiac size is within normal limits. No pericardial effusion. The thoracic aorta is suboptimally opacified, but appears unremarkable. There is suggestion of a dissection flap within the brachiocephalic artery arising at its origin. The left subclavian artery has been stented from its origin through the left axillary artery. Patency of the stented segment is not well assessed on this examination. Mediastinum/Nodes: No pathologic thoracic adenopathy. Lungs/Pleura: The lungs are symmetrically hyperinflated in keeping with changes of underlying COPD. This appears stable since prior examination. Densely calcified granuloma is seen within the basilar right middle lobe. The lungs are otherwise  clear. No pneumothorax or pleural effusion. Central airways are widely patent. Upper Abdomen: No acute abnormality. Musculoskeletal: Remote fracture deformity of the mid diaphysis of the right clavicle. ORIF of the right third, fourth, and fifth ribs with instrumentation noted antral laterally. Multiple healed additional right rib fractures noted. Healed fracture of the left fourth rib posteriorly noted. Severe anterior wedge compression fracture of T5 is unchanged. Similarly, mild compression fracture of T9 is also unchanged. No acute bone abnormality. Review  of the MIP images confirms the above findings. IMPRESSION: No acute pulmonary embolism. Mild left ventricular hypertrophy. COPD with stable pulmonary hyperinflation. 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 Patency of the stented segment is not well assessed on this examination. Electronically Signed   By: Helyn Numbers MD   On: 01/29/2020 17:35   DG Chest Port 1 View  Result Date: 02/03/2020 CLINICAL DATA:  RSV infection. EXAM: PORTABLE CHEST 1 VIEW COMPARISON:  02/01/2020 FINDINGS: Lungs are adequately inflated without focal airspace consolidation or effusion. Cardiomediastinal silhouette and remainder of the exam is unchanged. IMPRESSION: No active disease. Electronically Signed   By: Elberta Fortis M.D.   On: 02/03/2020 10:07   DG Chest Port 1 View  Result Date: 02/01/2020 CLINICAL DATA:  Respiratory distress.  COPD. EXAM: PORTABLE CHEST 1 VIEW COMPARISON:  CT chest 01/29/2020.  Chest x-ray 01/29/2020. FINDINGS: Mediastinum and hilar structures are normal. Stable right apical pleural-parenchymal thickening consistent scarring. Low lung volumes. No acute infiltrates. No pleural effusion or pneumothorax. Heart size normal. Right subclavian stents noted. Postsurgical changes right chest wall. Hardware intact. IMPRESSION: Stable right apical pleural-parenchymal thickening consistent scarring. Low lung volumes. No acute infiltrates. Electronically Signed   By: Maisie Fus  Register   On: 02/01/2020 10:01   DG Chest Portable 1 View  Result Date: 01/29/2020 CLINICAL DATA:  Shortness of breath EXAM: PORTABLE CHEST 1 VIEW COMPARISON:  01/02/2020, CT 05/31/2019 FINDINGS: Right subclavian and axillary stent. Surgical plate and fixating screws along the right ribs. Hyperinflation with emphysematous disease and bronchitic change. No consolidation, pleural effusion, or pneumothorax. Stable cardiomediastinal silhouette. IMPRESSION: No active  cardiopulmonary disease. Hyperinflation with emphysematous disease and bronchitic changes. Electronically Signed   By: Jasmine Pang M.D.   On: 01/29/2020 16:02   ECHOCARDIOGRAM COMPLETE  Result Date: 01/30/2020    ECHOCARDIOGRAM REPORT   Patient Name:   DONNALYN JURAN Date of Exam: 01/30/2020 Medical Rec #:  [id number]     Height:       62.0 in Accession #:    [id number]    Weight:       181.4 lb Date of Birth:  [birth year]     BSA:          1.834 m Patient Age:    59 years      BP:           134/80 mmHg Patient Gender: F             HR:           107 bpm. Exam Location:  [facility] Procedure: 2D Echo, Cardiac Doppler and Color Doppler Indications:     Elevated troponin  History:         Patient has no prior history of Echocardiogram examinations.                  COPD; Risk Factors:Hypertension.  Sonographer:     Cristela Blue RDCS (AE) Referring Phys:  [id number] Jonny Ruiz  DOUTOVA Diagnosing Phys: Arnoldo Hooker MD  Sonographer Comments: Image acquisition challenging due to COPD. IMPRESSIONS  1. Left ventricular ejection fraction, by estimation, is 65 to 70%. The left ventricle has normal function. The left ventricle has no regional wall motion abnormalities. Left ventricular diastolic parameters were normal.  2. Right ventricular systolic function is normal. The right ventricular size is normal. There is normal pulmonary artery systolic pressure.  3. The mitral valve is normal in structure. Trivial mitral valve regurgitation.  4. The aortic valve is normal in structure. Aortic valve regurgitation is not visualized. FINDINGS  Left Ventricle: Left ventricular ejection fraction, by estimation, is 65 to 70%. The left ventricle has normal function. The left ventricle has no regional wall motion abnormalities. The left ventricular internal cavity size was small. There is no left ventricular hypertrophy. Left ventricular diastolic parameters were normal. Right Ventricle: The right ventricular size is normal. No increase in right  ventricular wall thickness. Right ventricular systolic function is normal. There is normal pulmonary artery systolic pressure. The tricuspid regurgitant velocity is 2.09 m/s, and  with an assumed right atrial pressure of 10 mmHg, the estimated right ventricular systolic pressure is 27.5 mmHg. Left Atrium: Left atrial size was normal in size. Right Atrium: Right atrial size was normal in size. Pericardium: There is no evidence of pericardial effusion. Mitral Valve: The mitral valve is normal in structure. Trivial mitral valve regurgitation. Tricuspid Valve: The tricuspid valve is normal in structure. Tricuspid valve regurgitation is trivial. Aortic Valve: The aortic valve is normal in structure. Aortic valve regurgitation is not visualized. Aortic valve mean gradient measures 4.0 mmHg. Aortic valve peak gradient measures 6.2 mmHg. Aortic valve area, by VTI measures 3.45 cm. Pulmonic Valve: The pulmonic valve was normal in structure. Pulmonic valve regurgitation is not visualized. Aorta: The aortic root and ascending aorta are structurally normal, with no evidence of dilitation. IAS/Shunts: No atrial level shunt detected by color flow Doppler.  LEFT VENTRICLE PLAX 2D LVIDd:         3.24 cm  Diastology LVIDs:         2.08 cm  LV e' lateral:   11.60 cm/s LV PW:         1.13 cm  LV E/e' lateral: 7.2 LV IVS:        0.94 cm  LV e' medial:    6.20 cm/s LVOT diam:     2.00 cm  LV E/e' medial:  13.5 LV SV:         61 LV SV Index:   33 LVOT Area:     3.14 cm  RIGHT VENTRICLE RV Basal diam:  3.43 cm RV S prime:     17.60 cm/s TAPSE (M-mode): 3.1 cm LEFT ATRIUM           Index       RIGHT ATRIUM          Index LA diam:      3.00 cm 1.64 cm/m  RA Area:     9.05 cm LA Vol (A2C): 25.3 ml 13.79 ml/m RA Volume:   18.50 ml 10.09 ml/m LA Vol (A4C): 31.8 ml 17.34 ml/m  AORTIC VALVE AV Area (Vmax):    2.89 cm AV Area (Vmean):   3.09 cm AV Area (VTI):     3.45 cm AV Vmax:           124.00 cm/s AV Vmean:          90.500 cm/s AV  VTI:  0.176 m AV Peak Grad:      6.2 mmHg AV Mean Grad:      4.0 mmHg LVOT Vmax:         114.00 cm/s LVOT Vmean:        89.100 cm/s LVOT VTI:          0.193 m LVOT/AV VTI ratio: 1.10  AORTA Ao Root diam: 2.90 cm MITRAL VALVE                TRICUSPID VALVE MV Area (PHT): 2.93 cm     TR Peak grad:   17.5 mmHg MV Decel Time: 259 msec     TR Vmax:        209.00 cm/s MV E velocity: 84.00 cm/s MV A velocity: 118.00 cm/s  SHUNTS MV E/A ratio:  0.71         Systemic VTI:  0.19 m                             Systemic Diam: 2.00 cm Arnoldo HookerBruce Kowalski MD Electronically signed by Arnoldo HookerBruce Kowalski MD Signature Date/Time: 01/30/2020/5:31:58 PM    Final     (Echo, Carotid, EGD, Colonoscopy, ERCP)    Subjective:  awake alert anxious to go home  determined to quit smoking  Discharge Exam: Vitals:   02/08/20 0724 02/08/20 0729  BP:  116/76  Pulse:  73  Resp:  18  Temp:  97.9 F (36.6 C)  SpO2: 97% 100%   Vitals:   02/08/20 0022 02/08/20 0327 02/08/20 0724 02/08/20 0729  BP: (!) 85/65 91/65  116/76  Pulse: 100 85  73  Resp:  18  18  Temp: 98.3 F (36.8 C) 98.2 F (36.8 C)  97.9 F (36.6 C)  TempSrc: Oral Oral  Oral  SpO2: 98% 98% 97% 100%  Weight:      Height:        General: Pt is alert, awake, not in acute distress Cardiovascular: RRR, S1/S2 +, no rubs, no gallops Respiratory: few scattered wheezes  bilaterally, no wheezing, no rhonchi Abdominal: Soft, NT, ND, bowel sounds + Extremities: no edema, no cyanosis    The results of significant diagnostics from this hospitalization (including imaging, microbiology, ancillary and laboratory) are listed below for reference.     Microbiology: Recent Results (from the past 240 hour(s))  SARS Coronavirus 2 by RT PCR (hospital order, [facility]) Nasopharyngeal Nasopharyngeal Swab     Status: None   Collection Time: 01/29/20  3:28 PM   Specimen: Nasopharyngeal Swab  Result Value Ref Range Status   SARS Coronavirus  2 NEGATIVE NEGATIVE Final    Comment: (NOTE) SARS-CoV-2 target nucleic acids are NOT DETECTED.  The SARS-CoV-2 RNA is generally detectable in upper and lower respiratory specimens during the acute phase of infection. The lowest concentration of SARS-CoV-2 viral copies this assay can detect is 250 copies / mL. A negative result does not preclude SARS-CoV-2 infection and should not be used as the sole basis for treatment or other patient management decisions.  A negative result may occur with improper specimen collection / handling, submission of specimen other than nasopharyngeal swab, presence of viral mutation(s) within the areas targeted by this assay, and inadequate number of viral copies (<250 copies / mL). A negative result must be combined with clinical observations, patient history, and epidemiological information.  Fact Sheet for Patients:   BoilerBrush.com.cyhttps://www.fda.gov/media/136312/download  Fact Sheet for Healthcare Providers: https://pope.com/https://www.fda.gov/media/136313/download  This test is not yet approved or  cleared by the [place] and has been authorized for detection and/or diagnosis of SARS-CoV-2 by FDA under an Emergency Use Authorization (EUA).  This EUA will remain in effect (meaning this test can be used) for the duration of the COVID-19 declaration under Section 564(b)(1) of the Act, 21 U.S.C. section 360bbb-3(b)(1), unless the authorization is terminated or revoked sooner.  Performed at [facility], [address] [place], [place] [address]   Respiratory Panel by PCR     Status: Abnormal   Collection Time: 01/30/20 12:12 AM   Specimen: Nasopharyngeal Swab; Respiratory  Result Value Ref Range Status   Adenovirus NOT DETECTED NOT DETECTED Final   Coronavirus 229E NOT DETECTED NOT DETECTED Final    Comment: (NOTE) The Coronavirus on the Respiratory Panel, DOES NOT test for the novel  Coronavirus (2019 nCoV)    Coronavirus HKU1 NOT DETECTED NOT DETECTED  Final   Coronavirus NL63 NOT DETECTED NOT DETECTED Final   Coronavirus OC43 NOT DETECTED NOT DETECTED Final   Metapneumovirus NOT DETECTED NOT DETECTED Final   Rhinovirus / Enterovirus NOT DETECTED NOT DETECTED Final   Influenza A NOT DETECTED NOT DETECTED Final   Influenza B NOT DETECTED NOT DETECTED Final   Parainfluenza Virus 1 NOT DETECTED NOT DETECTED Final   Parainfluenza Virus 2 NOT DETECTED NOT DETECTED Final   Parainfluenza Virus 3 NOT DETECTED NOT DETECTED Final   Parainfluenza Virus 4 NOT DETECTED NOT DETECTED Final   Respiratory Syncytial Virus DETECTED (A) NOT DETECTED Final   Bordetella pertussis NOT DETECTED NOT DETECTED Final   Chlamydophila pneumoniae NOT DETECTED NOT DETECTED Final   Mycoplasma pneumoniae NOT DETECTED NOT DETECTED Final    Comment: Performed at [facility], [address], [place], [place] [address]  MRSA PCR Screening     Status: None   Collection Time: 01/30/20 12:12 AM   Specimen: Nasopharyngeal  Result Value Ref Range Status   MRSA by PCR NEGATIVE NEGATIVE Final    Comment:        The GeneXpert MRSA Assay (FDA approved for NASAL specimens only), is one component of a comprehensive MRSA colonization surveillance program. It is not intended to diagnose MRSA infection nor to guide or monitor treatment for MRSA infections. Performed at [facility], [address] [place], [place] [address]      Labs: BNP (last 3 results) Recent Labs    01/29/20 1528  BNP 410.3*   Basic Metabolic Panel: Recent Labs  Lab 02/02/20 0523 02/04/20 0525 02/06/20 0619 02/08/20 0434  NA 138 137 133* 134*  K 5.0 4.5 4.7 4.2  CL 98 95* 95* 97*  CO2 GLUCOSE 164* 155* 195* 130*  BUN 23 24* 28* 29*  CREATININE 0.69 0.63 0.74 0.68  CALCIUM 9.5 8.9 9.2 9.0  MG  --  2.0  --   --   PHOS  --  4.0  --   --    Liver Function Tests: No results for input(s): AST, ALT, ALKPHOS, BILITOT, PROT, ALBUMIN in the last 168 hours. No  results for input(s): LIPASE, AMYLASE in the last 168 hours. No results for input(s): AMMONIA in the last 168 hours. CBC: Recent Labs  Lab 02/02/20 0523 02/04/20 0525 02/06/20 0619 02/08/20 0434  WBC 10.1 11.9* 17.8* 15.8*  HGB 11.7* 12.6 14.2 12.1  HCT 35.6* 38.6 40.8 36.8  MCV 90.1 91.0 86.6 90.9  PLT 185 213 252 205  Cardiac Enzymes: No results for input(s): CKTOTAL, CKMB, CKMBINDEX, TROPONINI in the last 168 hours. BNP: Invalid input(s): POCBNP CBG: No results for input(s): GLUCAP in the last 168 hours. D-Dimer No results for input(s): DDIMER in the last 72 hours. Hgb A1c No results for input(s): HGBA1C in the last 72 hours. Lipid Profile No results for input(s): CHOL, HDL, LDLCALC, TRIG, CHOLHDL, LDLDIRECT in the last 72 hours. Thyroid function studies No results for input(s): TSH, T4TOTAL, T3FREE, THYROIDAB in the last 72 hours.  Invalid input(s): FREET3 Anemia work up No results for input(s): VITAMINB12, FOLATE, FERRITIN, TIBC, IRON, RETICCTPCT in the last 72 hours. Urinalysis    Component Value Date/Time   COLORURINE YELLOW (A) 02/12/2019 1026   APPEARANCEUR CLEAR (A) 02/12/2019 1026   APPEARANCEUR Cloudy 03/19/2014 1818   LABSPEC 1.012 02/12/2019 1026   LABSPEC 1.018 03/19/2014 1818   PHURINE 7.0 02/12/2019 1026   GLUCOSEU NEGATIVE 02/12/2019 1026   GLUCOSEU Negative 03/19/2014 1818   HGBUR NEGATIVE 02/12/2019 1026   BILIRUBINUR NEGATIVE 02/12/2019 1026   BILIRUBINUR Negative 03/19/2014 1818   KETONESUR NEGATIVE 02/12/2019 1026   PROTEINUR NEGATIVE 02/12/2019 1026   NITRITE NEGATIVE 02/12/2019 1026   LEUKOCYTESUR NEGATIVE 02/12/2019 1026   LEUKOCYTESUR Trace 03/19/2014 1818   Sepsis Labs Invalid input(s): PROCALCITONIN,  WBC,  LACTICIDVEN Microbiology Recent Results (from the past 240 hour(s))  SARS Coronavirus 2 by RT PCR (hospital order, performed in [facility] hospital lab) Nasopharyngeal Nasopharyngeal Swab     Status: None   Collection Time:  01/29/20  3:28 PM   Specimen: Nasopharyngeal Swab  Result Value Ref Range Status   SARS Coronavirus 2 NEGATIVE NEGATIVE Final    Comment: (NOTE) SARS-CoV-2 target nucleic acids are NOT DETECTED.  The SARS-CoV-2 RNA is generally detectable in upper and lower respiratory specimens during the acute phase of infection. The lowest concentration of SARS-CoV-2 viral copies this assay can detect is 250 copies / mL. A negative result does not preclude SARS-CoV-2 infection and should not be used as the sole basis for treatment or other patient management decisions.  A negative result may occur with improper specimen collection / handling, submission of specimen other than nasopharyngeal swab, presence of viral mutation(s) within the areas targeted by this assay, and inadequate number of viral copies (<250 copies / mL). A negative result must be combined with clinical observations, patient history, and epidemiological information.  Fact Sheet for Patients:   BoilerBrush.com.cy  Fact Sheet for Healthcare Providers: https://pope.com/  This test is not yet approved or  cleared by the [place] FDA and has been authorized for detection and/or diagnosis of SARS-CoV-2 by FDA under an Emergency Use Authorization (EUA).  This EUA will remain in effect (meaning this test can be used) for the duration of the COVID-19 declaration under Section 564(b)(1) of the Act, 21 U.S.C. section 360bbb-3(b)(1), unless the authorization is terminated or revoked sooner.  Performed at [facility], [address] [place], [place] [address]   Respiratory Panel by PCR     Status: Abnormal   Collection Time: 01/30/20 12:12 AM   Specimen: Nasopharyngeal Swab; Respiratory  Result Value Ref Range Status   Adenovirus NOT DETECTED NOT DETECTED Final   Coronavirus 229E NOT DETECTED NOT DETECTED Final    Comment: (NOTE) The Coronavirus on the Respiratory  Panel, DOES NOT test for the novel  Coronavirus (2019 nCoV)    Coronavirus HKU1 NOT DETECTED NOT DETECTED Final   Coronavirus NL63 NOT DETECTED NOT DETECTED Final   Coronavirus  OC43 NOT DETECTED NOT DETECTED Final   Metapneumovirus NOT DETECTED NOT DETECTED Final   Rhinovirus / Enterovirus NOT DETECTED NOT DETECTED Final   Influenza A NOT DETECTED NOT DETECTED Final   Influenza B NOT DETECTED NOT DETECTED Final   Parainfluenza Virus 1 NOT DETECTED NOT DETECTED Final   Parainfluenza Virus 2 NOT DETECTED NOT DETECTED Final   Parainfluenza Virus 3 NOT DETECTED NOT DETECTED Final   Parainfluenza Virus 4 NOT DETECTED NOT DETECTED Final   Respiratory Syncytial Virus DETECTED (A) NOT DETECTED Final   Bordetella pertussis NOT DETECTED NOT DETECTED Final   Chlamydophila pneumoniae NOT DETECTED NOT DETECTED Final   Mycoplasma pneumoniae NOT DETECTED NOT DETECTED Final    Comment: Performed at [facility] [facility], [address], [place], [place] [address]  MRSA PCR Screening     Status: None   Collection Time: 01/30/20 12:12 AM   Specimen: Nasopharyngeal  Result Value Ref Range Status   MRSA by PCR NEGATIVE NEGATIVE Final    Comment:        The GeneXpert MRSA Assay (FDA approved for NASAL specimens only), is one component of a comprehensive MRSA colonization surveillance program. It is not intended to diagnose MRSA infection nor to guide or monitor treatment for MRSA infections. Performed at [facility], [address], [place], [place] [address]      Time coordinating discharge:  39 minutes  SIGNED:   Alwyn Ren, MD  [facility] 02/08/2020, 9:31 AM Pager   If 7PM-7AM, please contact night-coverage [email or web address] Password [email or web address]

## 2020-02-08 NOTE — TOC Progression Note (Signed)
Transition of Care [facility]) - Progression Note    Patient Details  Name: Christy Mason MRN: [id number] Date of Birth: [birth year]  Transition of Care [facility]) CM/SW Contact  Trenton Founds, RN Phone Number: 02/08/2020, 11:20 AM  Clinical Narrative:   Per [facility]. [employer] will provide oxygen once qualifying sats are documented.     Expected Discharge Plan: Home w Home Health Services Barriers to Discharge: Continued Medical Work up  Expected Discharge Plan and Services Expected Discharge Plan: Home w Home Health Services     Post Acute Care Choice: Home Health Living arrangements for the past 2 months: Single Family Home Expected Discharge Date: 02/08/20                                     Social Determinants of Health (SDOH) Interventions    Readmission Risk Interventions No flowsheet data found.

## 2020-02-11 ENCOUNTER — Telehealth: Payer: Self-pay

## 2020-02-11 NOTE — Telephone Encounter (Signed)
Spoke w/ Lawson Fiscal [phone] at [facility] and she was advised of verbal order per [facility].

## 2020-02-11 NOTE — Telephone Encounter (Signed)
°  Patient was recently discharged from the hospital on 02/08/20.  No TCM completed, patient does not qualify for TCM services due to [facility] insurance coverage.  Per discharge summary patient needs follow up with PCP. HFU scheduled 02/15/20 @ 2:00 PM with Osvaldo Angst. FYI!

## 2020-02-11 NOTE — Progress Notes (Signed)
Inetta Fermo Cummings,acting as a Neurosurgeon for [name], PA-C.,have documented all relevant documentation on the behalf of [name], PA-C,as directed by  [name], PA-C while in the presence of [name], PA-C.  Established patient visit   Patient: Christy Mason   DOB: 10-31-1960   59 y.o. Female  MRN: [id number] Visit Date: 02/15/2020  Today's healthcare provider: [name], PA-C   Chief Complaint  Patient presents with  . Hospitalization Follow-up   Subjective    HPI  Follow up Hospitalization  Patient was admitted to [facility] on 01/29/20 and discharged on 02/08/20. She was treated for COPD exacerbation due to RSV infection.  Treatment for this included supportive treatment including steroids, duonebs. Her losartan hctz was stopped due to low BP.  Telephone follow up was done on 02/11/20. She reports excellent compliance with treatment. She reports this condition is improved.  ----------------------------------------------------------------------------------------- - Patient says that she has not smoked in 3 weeks. Today she reports her breathing is almost back to baseline. She does feel worn out but denies fevers, chills, nausea, vomiting, productive cough. She continues with outpatient steroids. She hasn't needed her BP medications since discharge due to low normal BP. She is not vaccinated against COVID but she plans to be.     Social History   Tobacco Use  . Smoking status: Former Smoker    Packs/day: 1.00    Years: 45.00    Pack years: 45.00    Types: Cigarettes    Start date: 1976  . Smokeless tobacco: Never Used  . Tobacco comment: quit for 16 years started again 07/2014  Vaping Use  . Vaping Use: Former  Substance Use Topics  . Alcohol use: Not Currently  . Drug use: Not Currently       Medications: Outpatient Medications Prior to Visit  Medication Sig  . ADVAIR DISKUS 250-50 MCG/DOSE AEPB INHALE 1 PUFF INTO THE LUNGS TWICE  DAILY (Patient taking differently: Inhale 1 puff into the lungs in the morning and at bedtime. )  . albuterol (PROVENTIL) (2.5 MG/3ML) 0.083% nebulizer solution Take 3 mLs (2.5 mg total) by nebulization every 4 (four) hours as needed for wheezing or shortness of breath.  Marland Kitchen aspirin EC 81 MG tablet Take 81 mg by mouth daily.  Marland Kitchen atorvastatin (LIPITOR) 20 MG tablet Take 1 tablet (20 mg total) by mouth daily.  . cetirizine (ZYRTEC) 10 MG tablet Take 10 mg by mouth daily.  . cholecalciferol (VITAMIN D3) 25 MCG (1000 UNIT) tablet Take 1,000 Units by mouth daily.  . clopidogrel (PLAVIX) 75 MG tablet Take 1 tablet (75 mg total) by mouth daily.  . diphenhydrAMINE HCl (ZZZQUIL) 50 MG/30ML LIQD Take 50 mg by mouth at bedtime.  Marland Kitchen losartan (COZAAR) 25 MG tablet Take 1 tablet (25 mg total) by mouth daily.  . Multiple Vitamin (MULTIVITAMIN WITH MINERALS) TABS tablet Take 1 tablet by mouth daily.  . nicotine (NICODERM CQ) 21 mg/24hr patch Place 1 patch (21 mg total) onto the skin daily.  . ondansetron (ZOFRAN) 4 MG tablet Take 1 tablet (4 mg total) by mouth every 6 (six) hours as needed for nausea.  . predniSONE (DELTASONE) 10 MG tablet Take 3 tablets daily for 3 days  Then 2 tablets daily for 3 days then one tablet daily till done  . PROAIR HFA 108 (90 Base) MCG/ACT inhaler INHALE 2 PUFFS INTO THE LUNGS EVERY 6 HOURS AS NEEDED FOR WHEEZING OR SHORTNESS OF BREATH (Patient taking  differently: Inhale 2 puffs into the lungs every 6 (six) hours as needed for wheezing or shortness of breath. )  . silver sulfADIAZINE (SILVADENE) 1 % cream Apply 1 application topically daily.  Marland Kitchen SPIRIVA HANDIHALER 18 MCG inhalation capsule INHALE CONTENTS OF 1 CAPSULE ONCE DAILY USING HANDIHALER (Patient taking differently: Place 18 mcg into inhaler and inhale daily. )  . SUBOXONE 8-2 MG FILM Take 0.5-1 Film by mouth in the morning and at bedtime.   . triamcinolone (NASACORT ALLERGY 24HR) 55 MCG/ACT AERO nasal inhaler Place 1 spray into  the nose daily as needed (allergies).   Marland Kitchen oxyCODONE (OXY IR/ROXICODONE) 5 MG immediate release tablet Take 1 tablet (5 mg total) by mouth every 6 (six) hours as needed for severe pain (headache). (Patient not taking: Reported on 02/15/2020)   No facility-administered medications prior to visit.    Review of Systems  Constitutional: Positive for fatigue.  Respiratory: Positive for cough. Negative for chest tightness and shortness of breath.   All other systems reviewed and are negative.     Objective    BP 126/78 (BP Location: Left Arm, Patient Position: Sitting, Cuff Size: Large)   Pulse 91   Temp 97.7 F (36.5 C) (Temporal)   Wt 183 lb 9.6 oz (83.3 kg)   BMI 33.05 kg/m    Physical Exam Vitals reviewed.  Constitutional:      Appearance: Normal appearance.  HENT:     Head: Normocephalic and atraumatic.     Right Ear: External ear normal.     Left Ear: External ear normal.  Eyes:     General: No scleral icterus.    Conjunctiva/sclera: Conjunctivae normal.  Cardiovascular:     Rate and Rhythm: Normal rate and regular rhythm.     Pulses: Normal pulses.     Heart sounds: Normal heart sounds.  Pulmonary:     Effort: Pulmonary effort is normal.     Breath sounds: Normal breath sounds.  Musculoskeletal:     Right lower leg: No edema.     Left lower leg: No edema.  Skin:    General: Skin is warm and dry.  Neurological:     General: No focal deficit present.     Mental Status: She is alert and oriented to person, place, and time.  Psychiatric:        Mood and Affect: Mood normal.        Behavior: Behavior normal.        Thought Content: Thought content normal.        Judgment: Judgment normal.       No results found for any visits on 02/15/20.  Assessment & Plan    1. Chronic obstructive pulmonary disease, unspecified COPD type (HCC)  Doing well, recheck labs as below. I have reconciled her hospital discharge medications with current medication list.   -  Comprehensive metabolic panel - CBC with Differential/Platelet  2. Essential hypertension  Continue to hold blood pressure medications for now. Reassess in 4 months.  3. Counseled about COVID-19 virus infection  I have spent > 10 minutes counseling about benefits of COVID vaccine.     Return in about 7 weeks (around 04/04/2020) for follow up .      ITrey Sailors, PA-C, have reviewed all documentation for this visit. The documentation on 03/12/20 for the exam, diagnosis, procedures, and orders are all accurate and complete.  The entirety of the information documented in the History of Present Illness, Review of Systems and  Physical Exam were personally obtained by me. Portions of this information were initially documented by [facility] and reviewed by me for thoroughness and accuracy.     Maryella Shivers  [facility] [phone] (phone) [phone] (fax)  [facility]

## 2020-02-11 NOTE — Telephone Encounter (Signed)
Copied from CRM [id number]. Topic: General - Other >> Feb 11, 2020  3:15 PM Dalphine Handing A wrote: Lawson Fiscal with [facility] called to get verbal orders for Home health orders 2w2 home health nursing and 3PRN s. Lawson Fiscal best contact [phone]

## 2020-02-11 NOTE — Telephone Encounter (Signed)
Verbal is fine to give.

## 2020-02-12 ENCOUNTER — Encounter: Payer: Self-pay | Admitting: Cardiology

## 2020-02-12 ENCOUNTER — Ambulatory Visit: Payer: Medicaid Other | Admitting: Cardiology

## 2020-02-12 ENCOUNTER — Other Ambulatory Visit: Payer: Self-pay

## 2020-02-12 ENCOUNTER — Telehealth: Payer: Self-pay

## 2020-02-12 VITALS — BP 90/60 | HR 109 | Ht 62.5 in | Wt 179.1 lb

## 2020-02-12 DIAGNOSIS — I10 Essential (primary) hypertension: Secondary | ICD-10-CM | POA: Diagnosis not present

## 2020-02-12 DIAGNOSIS — R0600 Dyspnea, unspecified: Secondary | ICD-10-CM | POA: Diagnosis not present

## 2020-02-12 DIAGNOSIS — R0609 Other forms of dyspnea: Secondary | ICD-10-CM

## 2020-02-12 DIAGNOSIS — I739 Peripheral vascular disease, unspecified: Secondary | ICD-10-CM | POA: Diagnosis not present

## 2020-02-12 MED ORDER — LOSARTAN POTASSIUM 25 MG PO TABS
25.0000 mg | ORAL_TABLET | Freq: Every day | ORAL | 3 refills | Status: DC
Start: 2020-02-12 — End: 2020-04-10

## 2020-02-12 MED ORDER — ATORVASTATIN CALCIUM 20 MG PO TABS
20.0000 mg | ORAL_TABLET | Freq: Every day | ORAL | 3 refills | Status: DC
Start: 2020-02-12 — End: 2020-06-09

## 2020-02-12 NOTE — Telephone Encounter (Signed)
Yes fine to give verbal.

## 2020-02-12 NOTE — Progress Notes (Signed)
Cardiology Office Note:    Date:  02/12/2020   ID:  Christy BlalockSherry M Bhakta, DOB 1960-12-22, MRN [id number]  PCP:  Trey SailorsPollak, Adriana M, PA-C  [facility] Cardiologist:  Debbe OdeaBrian Agbor-Etang, MD  [facility] Electrophysiologist:  None   Referring MD: Trey SailorsPollak, Adriana M, PA-C   Chief Complaint  Patient presents with  . New Patient (Initial Visit)    Hospital F/U-Patient reports DOE and weakness; Meds verbally reviewed with patient.    History of Present Illness:    Christy Mason is a 59 y.o. female with a hx of anxiety, hypertension, COPD, PAD/R. subclavian stenosis status post stent placement being followed by vascular surgery, former smoker x40+ years,  who presents due to shortness of breath. Recently admitted on 01/29/2020 for respiratory distress, diagnosed with COPD exacerbation.  She quit smoking 3 weeks ago.  Symptoms of shortness of breath have been slowly improving since discharge.  He denies chest pain at rest or with ambulation.  She has been taking losartan/HCTZ combination since discharge.  Patient otherwise feels fine, breathing is much improved since.  Patient had echocardiogram on 01/21/2020 which showed normal systolic and diastolic function, EF 65%.  Past Medical History:  Diagnosis Date  . Allergy   . Anxiety   . Arthritis   . Asthma   . Complication of anesthesia   . COPD (chronic obstructive pulmonary disease) (HCC)   . Emphysema of lung (HCC)   . GERD (gastroesophageal reflux disease)   . History of hiatal hernia   . Hypertension   . Hyperthyroidism    h/o  . Neuromuscular disorder (HCC)   . PONV (postoperative nausea and vomiting)   . Thyroid disease     Past Surgical History:  Procedure Laterality Date  . ANGIOPLASTY Right 01/01/2020   Procedure: ANGIOPLASTY ans STENT PLACEMENT RIGHT SUBCLAVIAN AND AXILLARY ARTERY;  Surgeon: Renford DillsSchnier, Gregory G, MD;  Location: [facility] ORS;  Service: Vascular;  Laterality: Right;  . CARPAL TUNNEL RELEASE Right   . CHOLECYSTECTOMY      . FRACTURE SURGERY Right    plates  . FUNCTIONAL ENDOSCOPIC SINUS SURGERY    . SALPINGOOPHORECTOMY Right   . THROMBECTOMY BRACHIAL ARTERY Right 01/01/2020   Procedure: THROMBECTOMY BRACHIAL AXILLARY ARTERY;  Surgeon: Renford DillsSchnier, Gregory G, MD;  Location: [facility] ORS;  Service: Vascular;  Laterality: Right;  . TUBAL LIGATION    . UPPER EXTREMITY ANGIOGRAPHY Right 01/23/2019   Procedure: UPPER EXTREMITY ANGIOGRAPHY;  Surgeon: Renford DillsSchnier, Gregory G, MD;  Location: [facility] INVASIVE CV LAB;  Service: Cardiovascular;  Laterality: Right;  . UPPER EXTREMITY ANGIOGRAPHY Right 02/07/2019   Procedure: UPPER EXTREMITY ANGIOGRAPHY;  Surgeon: Renford DillsSchnier, Gregory G, MD;  Location: [facility] IN[facility] CV [facility];  Service: Cardiovascular;  Laterality: Right;  . UPPER EXTREMITY ANGIOGRAPHY Right 01/01/2020   Procedure: UPPER EXTREMITY ANGIOGRAPHY;  Surgeon: Renford DillsSchnier, Gregory G, MD;  Location: [facility] INV[facility] CV LAB;  Service: Cardiovascular;  Laterality: Right;    Current Medications: Current Meds  Medication Sig  . ADVAIR DISKUS 250-50 MCG/DOSE AEPB INHALE 1 PUFF INTO THE LUNGS TWICE DAILY (Patient taking differently: Inhale 1 puff into the lungs in the morning and at bedtime. )  . albuterol (PROVENTIL) (2.5 MG/3ML) 0.083% nebulizer solution Take 3 mLs (2.5 mg total) by nebulization every 4 (four) hours as needed for wheezing or shortness of breath.  Marland Kitchen. aspirin EC 81 MG tablet Take 81 mg by mouth daily.  . cetirizine (ZYRTEC) 10 MG tablet Take 10 mg by mouth daily.  . cholecalciferol (VITAMIN D3) 25 MCG (  1000 UNIT) tablet Take 1,000 Units by mouth daily.  . clopidogrel (PLAVIX) 75 MG tablet Take 1 tablet (75 mg total) by mouth daily.  . diphenhydrAMINE HCl (ZZZQUIL) 50 MG/30ML LIQD Take 50 mg by mouth at bedtime.  . Multiple Vitamin (MULTIVITAMIN WITH MINERALS) TABS tablet Take 1 tablet by mouth daily.  . nicotine (NICODERM CQ) 21 mg/24hr patch Place 1 patch (21 mg total) onto the skin daily.  . ondansetron (ZOFRAN) 4 MG tablet Take 1  tablet (4 mg total) by mouth every 6 (six) hours as needed for nausea.  Marland Kitchen oxyCODONE (OXY IR/ROXICODONE) 5 MG immediate release tablet Take 1 tablet (5 mg total) by mouth every 6 (six) hours as needed for severe pain (headache).  Marland Kitchen PROAIR HFA 108 (90 Base) MCG/ACT inhaler INHALE 2 PUFFS INTO THE LUNGS EVERY 6 HOURS AS NEEDED FOR WHEEZING OR SHORTNESS OF BREATH (Patient taking differently: Inhale 2 puffs into the lungs every 6 (six) hours as needed for wheezing or shortness of breath. )  . silver sulfADIAZINE (SILVADENE) 1 % cream Apply 1 application topically daily.  Marland Kitchen SPIRIVA HANDIHALER 18 MCG inhalation capsule INHALE CONTENTS OF 1 CAPSULE ONCE DAILY USING HANDIHALER (Patient taking differently: Place 18 mcg into inhaler and inhale daily. )  . SUBOXONE 8-2 MG FILM Take 0.5-1 Film by mouth in the morning and at bedtime.   . triamcinolone (NASACORT ALLERGY 24HR) 55 MCG/ACT AERO nasal inhaler Place 1 spray into the nose daily as needed (allergies).      Allergies:   Acetaminophen, Nsaids, Other, and Sular [nisoldipine er]   Social History   Socioeconomic History  . Marital status: Divorced    Spouse name: Not on file  . Number of children: 4  . Years of education: Not on file  . Highest education level: Not on file  Occupational History  . Occupation: [employer]SI  Tobacco Use  . Smoking status: Former Smoker    Packs/day: 1.00    Years: 45.00    Pack years: 45.00    Types: Cigarettes    Start date: 1976  . Smokeless tobacco: Never Used  . Tobacco comment: quit for 16 years started again 07/2014  Vaping Use  . Vaping Use: Former  Substance and Sexual Activity  . Alcohol use: Not Currently  . Drug use: Not Currently  . Sexual activity: Not Currently  Other Topics Concern  . Not on file  Social History Narrative  . Not on file   Social Determinants of Health   Financial Resource Strain:   . Difficulty of Paying Living Expenses:   Food Insecurity:   . Worried About Programme researcher, broadcasting/film/video  in the Last Year:   . Barista in the Last Year:   Transportation Needs:   . Freight forwarder (Medical):   Marland Kitchen Lack of Transportation (Non-Medical):   Physical Activity:   . Days of Exercise per Week:   . Minutes of Exercise per Session:   Stress:   . Feeling of Stress :   Social Connections:   . Frequency of Communication with Friends and Family:   . Frequency of Social Gatherings with Friends and Family:   . Attends Religious Services:   . Active Member of Clubs or Organizations:   . Attends Banker Meetings:   Marland Kitchen Marital Status:      Family History: The patient's family history includes COPD in her mother; Depression in her mother; Diabetes in her father; Hypertension in her father and mother.  ROS:   Please see the history of present illness.     All other systems reviewed and are negative.  EKGs/Labs/Other Studies Reviewed:    The following studies were reviewed today:   EKG:  EKG is  ordered today.  The ekg ordered today demonstrates sinus tachycardia, otherwise normal ECG.  Follow-up with pulmonary  Recent Labs: 01/29/2020: B Natriuretic Peptide 410.3 01/30/2020: ALT 19; TSH 0.286 02/04/2020: Magnesium 2.0 02/08/2020: BUN 29; Creatinine, Ser 0.68; Hemoglobin 12.1; Platelets 205; Potassium 4.2; Sodium 134  Recent Lipid Panel    Component Value Date/Time   CHOL 167 10/08/2019 1535   TRIG 124 01/03/2020 0431   HDL 37 (L) 10/08/2019 1535   CHOLHDL 4.5 (H) 10/08/2019 1535   LDLCALC 101 (H) 10/08/2019 1535    Physical Exam:    VS:  BP 90/60 (BP Location: Left Arm, Patient Position: Sitting, Cuff Size: Normal)   Pulse (!) 109   Ht 5' 2.5" (1.588 m)   Wt 179 lb 2 oz (81.3 kg)   SpO2 96%   BMI 32.24 kg/m     Wt Readings from Last 3 Encounters:  02/12/20 179 lb 2 oz (81.3 kg)  01/30/20 181 lb 7 oz (82.3 kg)  01/21/20 188 lb (85.3 kg)     GEN:  Well nourished, well developed in no acute distress HEENT: Normal NECK: No JVD; No carotid  bruits LYMPHATICS: No lymphadenopathy CARDIAC: RRR, no murmurs, rubs, gallops RESPIRATORY: Decreased breath sounds, otherwise clear. ABDOMEN: Soft, non-tender, non-distended MUSCULOSKELETAL:  No edema; No deformity  SKIN: Warm and dry NEUROLOGIC:  Alert and oriented x 3 PSYCHIATRIC:  Normal affect   ASSESSMENT:    1. Essential hypertension   2. PAD (peripheral artery disease) (HCC)   3. Dyspnea on exertion    PLAN:    In order of problems listed above:  1. Patient with history of hypertension, blood pressure low normal today.  Stop HCTZ.  Decrease losartan to 25 mg daily. 2. History of peripheral artery disease, right subclavian artery stenosis.  Continue aspirin and Plavix.  start Lipitor 20 mg daily.  Check fasting lipid profile in 3 months. 3. Patient with history of dyspnea, slowly improving since stopping smoking.  Etiology is likely COPD.  She denies chest discomfort.  If symptoms persist or chest discomfort arises, will consider stress testing.  Follow-up in 3 months.  Total encounter time 60 minutes  Greater than 50% was spent in counseling and coordination of care with the patient  This note was generated in part or whole with voice recognition software. Voice recognition is usually quite accurate but there are transcription errors that can and very often do occur. I apologize for any typographical errors that were not detected and corrected.  Medication Adjustments/Labs and Tests Ordered: Current medicines are reviewed at length with the patient today.  Concerns regarding medicines are outlined above.  Orders Placed This Encounter  Procedures  . Lipid panel  . EKG 12-Lead   Meds ordered this encounter  Medications  . losartan (COZAAR) 25 MG tablet    Sig: Take 1 tablet (25 mg total) by mouth daily.    Dispense:  30 tablet    Refill:  3  . atorvastatin (LIPITOR) 20 MG tablet    Sig: Take 1 tablet (20 mg total) by mouth daily.    Dispense:  30 tablet    Refill:   3    Patient Instructions  Medication Instructions:   Your physician has recommended you make the following change  in your medication:   1.  START taking losartan (COZAAR) 25 MG tablet: Take 1 tablet (25 mg total) by mouth daily. 2.  START taking atorvastatin (LIPITOR) 20 MG tablet: Take 1 tablet (20 mg total) by mouth daily.  *If you need a refill on your cardiac medications before your next appointment, please call your pharmacy*   Lab Work:  Your physician recommends that you return for a FASTING lipid profile: In 3 months (just prior to follow up apt)  - You will need to be fasting. Please do not have anything to eat or drink after midnight the morning you have the lab work. You may only have water or black coffee with no cream or sugar. - Please go to the [facility]. You will check in at the front desk to the right as you walk into the atrium. Valet Parking is offered if needed.  - No appointment needed. You may go any day between 7 am and 6 pm.  If you have labs (blood work) drawn today and your tests are completely normal, you will receive your results only by: Marland Kitchen MyChart Message (if you have MyChart) OR . A paper copy in the mail If you have any lab test that is abnormal or we need to change your treatment, we will call you to review the results.   Testing/Procedures: None Ordered   Follow-Up: At [facility], you and your health needs are our priority.  As part of our continuing mission to provide you with exceptional heart care, we have created designated Provider Care Teams.  These Care Teams include your primary Cardiologist (physician) and Advanced Practice Providers (APPs -  Physician Assistants and Nurse Practitioners) who all work together to provide you with the care you need, when you need it.  We recommend signing up for the patient portal called "MyChart".  Sign up information is provided on this After Visit Summary.  MyChart is used to connect with  patients for Virtual Visits (Telemedicine).  Patients are able to view lab/test results, encounter notes, upcoming appointments, etc.  Non-urgent messages can be sent to your provider as well.   To learn more about what you can do with MyChart, go to [email or web address].    Your next appointment:   3 month(s)  The format for your next appointment:   In Person  Provider:   Debbe Odea, MD   Other Instructions       Signed, Debbe Odea, MD  02/12/2020 10:28 AM    [facility]

## 2020-02-12 NOTE — Telephone Encounter (Signed)
Verbal order given  

## 2020-02-12 NOTE — Telephone Encounter (Signed)
Copied from CRM [id number]. Topic: General - Other >> Feb 12, 2020  1:05 PM Christy Mason wrote: Christy Mason is Seeking verbal approval for home health PT 2w1 ,1w4 Christy Mason with [facility] would like Mason callback at [phone]. Secure voicemail .

## 2020-02-12 NOTE — Telephone Encounter (Signed)
Please advise 

## 2020-02-12 NOTE — Patient Instructions (Signed)
Medication Instructions:   Your physician has recommended you make the following change in your medication:   1.  START taking losartan (COZAAR) 25 MG tablet: Take 1 tablet (25 mg total) by mouth daily. 2.  START taking atorvastatin (LIPITOR) 20 MG tablet: Take 1 tablet (20 mg total) by mouth daily.  *If you need a refill on your cardiac medications before your next appointment, please call your pharmacy*   Lab Work:  Your physician recommends that you return for a FASTING lipid profile: In 3 months (just prior to follow up apt)  - You will need to be fasting. Please do not have anything to eat or drink after midnight the morning you have the lab work. You may only have water or black coffee with no cream or sugar. - Please go to the [facility]. You will check in at the front desk to the right as you walk into the atrium. Valet Parking is offered if needed.  - No appointment needed. You may go any day between 7 am and 6 pm.  If you have labs (blood work) drawn today and your tests are completely normal, you will receive your results only by: Marland Kitchen MyChart Message (if you have MyChart) OR . A paper copy in the mail If you have any lab test that is abnormal or we need to change your treatment, we will call you to review the results.   Testing/Procedures: None Ordered   Follow-Up: At [facility], you and your health needs are our priority.  As part of our continuing mission to provide you with exceptional heart care, we have created designated Provider Care Teams.  These Care Teams include your primary Cardiologist (physician) and Advanced Practice Providers (APPs -  Physician Assistants and Nurse Practitioners) who all work together to provide you with the care you need, when you need it.  We recommend signing up for the patient portal called "MyChart".  Sign up information is provided on this After Visit Summary.  MyChart is used to connect with patients for Virtual Visits  (Telemedicine).  Patients are able to view lab/test results, encounter notes, upcoming appointments, etc.  Non-urgent messages can be sent to your provider as well.   To learn more about what you can do with MyChart, go to [email or web address].    Your next appointment:   3 month(s)  The format for your next appointment:   In Person  Provider:   Debbe Odea, MD   Other Instructions

## 2020-02-15 ENCOUNTER — Other Ambulatory Visit: Payer: Self-pay

## 2020-02-15 ENCOUNTER — Encounter: Payer: Self-pay | Admitting: Physician Assistant

## 2020-02-15 ENCOUNTER — Ambulatory Visit: Payer: Medicaid Other | Admitting: Physician Assistant

## 2020-02-15 VITALS — BP 126/78 | HR 91 | Temp 97.7°F | Wt 183.6 lb

## 2020-02-15 DIAGNOSIS — I10 Essential (primary) hypertension: Secondary | ICD-10-CM

## 2020-02-15 DIAGNOSIS — Z7189 Other specified counseling: Secondary | ICD-10-CM

## 2020-02-15 DIAGNOSIS — J449 Chronic obstructive pulmonary disease, unspecified: Secondary | ICD-10-CM | POA: Diagnosis not present

## 2020-02-16 LAB — COMPREHENSIVE METABOLIC PANEL
ALT: 19 IU/L (ref 0–32)
AST: 19 IU/L (ref 0–40)
Albumin/Globulin Ratio: 1.4 (ref 1.2–2.2)
Albumin: 3.4 g/dL — ABNORMAL LOW (ref 3.8–4.8)
Alkaline Phosphatase: 105 IU/L (ref 48–121)
BUN/Creatinine Ratio: 9 — ABNORMAL LOW (ref 12–28)
BUN: 11 mg/dL (ref 8–27)
Bilirubin Total: <0.2 mg/dL (ref 0.0–1.2)
CO2: 20 mmol/L (ref 20–29)
Calcium: 8.8 mg/dL (ref 8.7–10.3)
Chloride: 104 mmol/L (ref 96–106)
Creatinine, Ser: 1.19 mg/dL — ABNORMAL HIGH (ref 0.57–1.00)
GFR calc Af Amer: 57 mL/min/{1.73_m2} — ABNORMAL LOW (ref >59)
GFR calc non Af Amer: 49 mL/min/{1.73_m2} — ABNORMAL LOW (ref >59)
Globulin, Total: 2.4 g/dL (ref 1.5–4.5)
Glucose: 114 mg/dL — ABNORMAL HIGH (ref 65–99)
Potassium: 4 mmol/L (ref 3.5–5.2)
Sodium: 140 mmol/L (ref 134–144)
Total Protein: 5.8 g/dL — ABNORMAL LOW (ref 6.0–8.5)

## 2020-02-16 LAB — CBC WITH DIFFERENTIAL/PLATELET
Basophils Absolute: 0 10*3/uL (ref 0.0–0.2)
Basos: 1 %
EOS (ABSOLUTE): 0.3 10*3/uL (ref 0.0–0.4)
Eos: 4 %
Hematocrit: 32.6 % — ABNORMAL LOW (ref 34.0–46.6)
Hemoglobin: 10.2 g/dL — ABNORMAL LOW (ref 11.1–15.9)
Immature Grans (Abs): 0 10*3/uL (ref 0.0–0.1)
Immature Granulocytes: 0 %
Lymphocytes Absolute: 1.9 10*3/uL (ref 0.7–3.1)
Lymphs: 26 %
MCH: 28.9 pg (ref 26.6–33.0)
MCHC: 31.3 g/dL — ABNORMAL LOW (ref 31.5–35.7)
MCV: 92 fL (ref 79–97)
Monocytes Absolute: 0.8 10*3/uL (ref 0.1–0.9)
Monocytes: 11 %
Neutrophils Absolute: 4.3 10*3/uL (ref 1.4–7.0)
Neutrophils: 58 %
Platelets: 235 10*3/uL (ref 150–450)
RBC: 3.53 x10E6/uL — ABNORMAL LOW (ref 3.77–5.28)
RDW: 14.1 % (ref 11.7–15.4)
WBC: 7.5 10*3/uL (ref 3.4–10.8)

## 2020-02-21 ENCOUNTER — Other Ambulatory Visit (INDEPENDENT_AMBULATORY_CARE_PROVIDER_SITE_OTHER): Payer: Self-pay | Admitting: Vascular Surgery

## 2020-02-21 DIAGNOSIS — Z95820 Peripheral vascular angioplasty status with implants and grafts: Secondary | ICD-10-CM

## 2020-02-21 DIAGNOSIS — I771 Stricture of artery: Secondary | ICD-10-CM

## 2020-02-22 ENCOUNTER — Other Ambulatory Visit: Payer: Self-pay

## 2020-02-22 ENCOUNTER — Encounter (INDEPENDENT_AMBULATORY_CARE_PROVIDER_SITE_OTHER): Payer: Self-pay | Admitting: Nurse Practitioner

## 2020-02-22 ENCOUNTER — Ambulatory Visit (INDEPENDENT_AMBULATORY_CARE_PROVIDER_SITE_OTHER): Payer: Medicaid Other | Admitting: Nurse Practitioner

## 2020-02-22 ENCOUNTER — Ambulatory Visit (INDEPENDENT_AMBULATORY_CARE_PROVIDER_SITE_OTHER): Payer: Medicaid Other

## 2020-02-22 VITALS — BP 159/105 | HR 103 | Ht 62.0 in | Wt 185.0 lb

## 2020-02-22 DIAGNOSIS — T8130XA Disruption of wound, unspecified, initial encounter: Secondary | ICD-10-CM | POA: Diagnosis not present

## 2020-02-22 DIAGNOSIS — Z95820 Peripheral vascular angioplasty status with implants and grafts: Secondary | ICD-10-CM | POA: Diagnosis not present

## 2020-02-22 DIAGNOSIS — I771 Stricture of artery: Secondary | ICD-10-CM

## 2020-02-22 DIAGNOSIS — I10 Essential (primary) hypertension: Secondary | ICD-10-CM | POA: Diagnosis not present

## 2020-02-26 ENCOUNTER — Encounter (INDEPENDENT_AMBULATORY_CARE_PROVIDER_SITE_OTHER): Payer: Self-pay | Admitting: Nurse Practitioner

## 2020-02-26 NOTE — Progress Notes (Signed)
Subjective:    Patient ID: Christy Mason, female    DOB: 12-25-1960, 59 y.o.   MRN: [id number] Chief Complaint  Patient presents with  . Follow-up    2 week [facility] upper extermity angio RUE Arterial    The patient presents today for noninvasive studies following intervention on her right subclavian artery.  Following this intervention the patient had complications ischemia of her right hand which required open intervention including:  PROCEDURE: 1.  Thrombectomy of the right ulnar artery with a #2 Fogarty embolectomy catheter. 2.  Thrombectomy of the right brachial artery and axillary artery with a #2 Fogarty embolectomy catheter. 3.  Open angioplasty and stent placement of the right subclavian and proximal axillary artery brachial artery approach retrograde  The patient's wound at this time is completely healed.  Initially patient had some dehiscence of the area.  The patient notes that there is no drainage from the wound.  Patient also notes there is no numbness or coldness of her right hand.  She is able to do normal everyday activities without having weakness or severe pain.  She denies any fever, chills, nausea, vomiting or diarrhea.  Today there is a patent stent seen in the right subclavian artery via her upper extremity duplex.  It is noted that patent vessels are throughout the right upper extremity and a string flow in the radial artery   Review of Systems  Skin: Negative for color change, pallor and wound.  Neurological: Negative for weakness and numbness.  All other systems reviewed and are negative.      Objective:   Physical Exam Vitals reviewed.  HENT:     Head: Normocephalic.  Cardiovascular:     Rate and Rhythm: Normal rate and regular rhythm.     Pulses:          Radial pulses are 0 on the right side.     Heart sounds: Normal heart sounds.  Pulmonary:     Effort: Pulmonary effort is normal.     Breath sounds: Normal breath sounds.  Neurological:      Mental Status: She is alert and oriented to person, place, and time.  Psychiatric:        Mood and Affect: Mood normal.        Behavior: Behavior normal.        Thought Content: Thought content normal.        Judgment: Judgment normal.     BP (!) 159/105   Pulse (!) 103   Ht 5\' 2"  (1.575 m)   Wt 185 lb (83.9 kg)   BMI 33.84 kg/m   Past Medical History:  Diagnosis Date  . Allergy   . Anxiety   . Arthritis   . Asthma   . Complication of anesthesia   . COPD (chronic obstructive pulmonary disease) (HCC)   . Emphysema of lung (HCC)   . GERD (gastroesophageal reflux disease)   . History of hiatal hernia   . Hypertension   . Hyperthyroidism    h/o  . Neuromuscular disorder (HCC)   . PONV (postoperative nausea and vomiting)   . Thyroid disease     Social History   Socioeconomic History  . Marital status: Divorced    Spouse name: Not on file  . Number of children: 4  . Years of education: Not on file  . Highest education level: Not on file  Occupational History  . Occupation: SSI  Tobacco Use  . Smoking status: Former Smoker  Packs/day: 1.00    Years: 45.00    Pack years: 45.00    Types: Cigarettes    Start date: 14  . Smokeless tobacco: Never Used  . Tobacco comment: quit for 16 years started again 07/2014  Vaping Use  . Vaping Use: Former  Substance and Sexual Activity  . Alcohol use: Not Currently  . Drug use: Not Currently  . Sexual activity: Not Currently  Other Topics Concern  . Not on file  Social History Narrative  . Not on file   Social Determinants of Health   Financial Resource Strain:   . Difficulty of Paying Living Expenses:   Food Insecurity:   . Worried About Programme researcher, broadcasting/film/video in the Last Year:   . Barista in the Last Year:   Transportation Needs:   . Freight forwarder (Medical):   Marland Kitchen Lack of Transportation (Non-Medical):   Physical Activity:   . Days of Exercise per Week:   . Minutes of Exercise per Session:     Stress:   . Feeling of Stress :   Social Connections:   . Frequency of Communication with Friends and Family:   . Frequency of Social Gatherings with Friends and Family:   . Attends Religious Services:   . Active Member of Clubs or Organizations:   . Attends Banker Meetings:   Marland Kitchen Marital Status:   Intimate Partner Violence:   . Fear of Current or Ex-Partner:   . Emotionally Abused:   Marland Kitchen Physically Abused:   . Sexually Abused:     Past Surgical History:  Procedure Laterality Date  . ANGIOPLASTY Right 01/01/2020   Procedure: ANGIOPLASTY ans STENT PLACEMENT RIGHT SUBCLAVIAN AND AXILLARY ARTERY;  Surgeon: Renford Dills, MD;  Location: [facility] ORS;  Service: Vascular;  Laterality: Right;  . CARPAL TUNNEL RELEASE Right   . CHOLECYSTECTOMY    . FRACTURE SURGERY Right    plates  . FUNCTIONAL ENDOSCOPIC SINUS SURGERY    . SALPINGOOPHORECTOMY Right   . THROMBECTOMY BRACHIAL ARTERY Right 01/01/2020   Procedure: THROMBECTOMY BRACHIAL AXILLARY ARTERY;  Surgeon: Renford Dills, MD;  Location: [facility] ORS;  Service: Vascular;  Laterality: Right;  . TUBAL LIGATION    . UPPER EXTREMITY ANGIOGRAPHY Right 01/23/2019   Procedure: UPPER EXTREMITY ANGIOGRAPHY;  Surgeon: Renford Dills, MD;  Location: [facility] INVASIVE CV LAB;  Service: Cardiovascular;  Laterality: Right;  . UPPER EXTREMITY ANGIOGRAPHY Right 02/07/2019   Procedure: UPPER EXTREMITY ANGIOGRAPHY;  Surgeon: Renford Dills, MD;  Location: [facility] INVASIVE CV LAB;  Service: Cardiovascular;  Laterality: Right;  . UPPER EXTREMITY ANGIOGRAPHY Right 01/01/2020   Procedure: UPPER EXTREMITY ANGIOGRAPHY;  Surgeon: Renford Dills, MD;  Location: [facility] INVASIVE CV LAB;  Service: Cardiovascular;  Laterality: Right;    Family History  Problem Relation Age of Onset  . Depression Mother   . Hypertension Mother   . COPD Mother   . Diabetes Father   . Hypertension Father     Allergies  Allergen Reactions  . Acetaminophen      Avoid due to fatty liver   . Nsaids Other (See Comments)    irritates stomach severley   . Other     Steroids-muscle weakness and headache  . Sular [Nisoldipine Er] Palpitations       Assessment & Plan:   1. Subclavian arterial stenosis (HCC) While the patient does have string sign flow seen within the radial artery, she has no signs symptoms of decreased perfusion.  The patient likely has predominant flow through her ulnar artery.  The patient denies any arm pain or any difficulty with everyday tasks.  Based on this, we will proceed with close follow-up versus intervention.  The patient will return to the office in 3 months with noninvasive studies, however if symptoms should change the patient is urged to contact her office sooner if needed.  2. Essential hypertension Continue antihypertensive medications as already ordered, these medications have been reviewed and there are no changes at this time.   3. Wound dehiscence Wound is completely healed at this time   Current Outpatient Medications on File Prior to Visit  Medication Sig Dispense Refill  . ADVAIR DISKUS 250-50 MCG/DOSE AEPB INHALE 1 PUFF INTO THE LUNGS TWICE DAILY (Patient taking differently: Inhale 1 puff into the lungs in the morning and at bedtime. ) 60 each 1  . albuterol (PROVENTIL) (2.5 MG/3ML) 0.083% nebulizer solution Take 3 mLs (2.5 mg total) by nebulization every 4 (four) hours as needed for wheezing or shortness of breath. 75 mL 12  . aspirin EC 81 MG tablet Take 81 mg by mouth daily.    Marland Kitchen atorvastatin (LIPITOR) 20 MG tablet Take 1 tablet (20 mg total) by mouth daily. 30 tablet 3  . cetirizine (ZYRTEC) 10 MG tablet Take 10 mg by mouth daily.    . cholecalciferol (VITAMIN D3) 25 MCG (1000 UNIT) tablet Take 1,000 Units by mouth daily.    . clopidogrel (PLAVIX) 75 MG tablet Take 1 tablet (75 mg total) by mouth daily. 90 tablet 3  . diphenhydrAMINE HCl (ZZZQUIL) 50 MG/30ML LIQD Take 50 mg by mouth at bedtime.    Marland Kitchen  losartan (COZAAR) 25 MG tablet Take 1 tablet (25 mg total) by mouth daily. 30 tablet 3  . Multiple Vitamin (MULTIVITAMIN WITH MINERALS) TABS tablet Take 1 tablet by mouth daily.    . nicotine (NICODERM CQ) 21 mg/24hr patch Place 1 patch (21 mg total) onto the skin daily. 28 patch 0  . ondansetron (ZOFRAN) 4 MG tablet Take 1 tablet (4 mg total) by mouth every 6 (six) hours as needed for nausea. 20 tablet 0  . oxyCODONE (OXY IR/ROXICODONE) 5 MG immediate release tablet Take 1 tablet (5 mg total) by mouth every 6 (six) hours as needed for severe pain (headache). 36 tablet 0  . predniSONE (DELTASONE) 10 MG tablet Take 3 tablets daily for 3 days  Then 2 tablets daily for 3 days then one tablet daily till done 20 tablet 0  . PROAIR HFA 108 (90 Base) MCG/ACT inhaler INHALE 2 PUFFS INTO THE LUNGS EVERY 6 HOURS AS NEEDED FOR WHEEZING OR SHORTNESS OF BREATH (Patient taking differently: Inhale 2 puffs into the lungs every 6 (six) hours as needed for wheezing or shortness of breath. ) 17 g 1  . silver sulfADIAZINE (SILVADENE) 1 % cream Apply 1 application topically daily. 50 g 2  . SPIRIVA HANDIHALER 18 MCG inhalation capsule INHALE CONTENTS OF 1 CAPSULE ONCE DAILY USING HANDIHALER (Patient taking differently: Place 18 mcg into inhaler and inhale daily. ) 90 capsule 1  . SUBOXONE 8-2 MG FILM Take 0.5-1 Film by mouth in the morning and at bedtime.     . triamcinolone (NASACORT ALLERGY 24HR) 55 MCG/ACT AERO nasal inhaler Place 1 spray into the nose daily as needed (allergies).      No current facility-administered medications on file prior to visit.    There are no Patient Instructions on file for this visit. No follow-ups on  file.   Kris Hartmann, NP

## 2020-03-04 ENCOUNTER — Other Ambulatory Visit: Payer: Self-pay | Admitting: Physician Assistant

## 2020-03-04 DIAGNOSIS — Z720 Tobacco use: Secondary | ICD-10-CM

## 2020-03-04 DIAGNOSIS — I10 Essential (primary) hypertension: Secondary | ICD-10-CM

## 2020-03-12 ENCOUNTER — Telehealth: Payer: Self-pay

## 2020-03-12 NOTE — Telephone Encounter (Signed)
Left message to call back. Ok for [facility] to advise the patient.  

## 2020-03-12 NOTE — Telephone Encounter (Signed)
-----   Message from [name], [place] sent at 03/10/2020  1:38 PM EDT ----- Apologies for the late reply. Her kidney function was a little low and so was her hemoglobin. Would recommend rechecking these at her follow up visit.

## 2020-03-13 ENCOUNTER — Other Ambulatory Visit: Payer: Self-pay | Admitting: Physician Assistant

## 2020-03-13 DIAGNOSIS — I10 Essential (primary) hypertension: Secondary | ICD-10-CM

## 2020-03-14 ENCOUNTER — Telehealth: Payer: Self-pay | Admitting: Physician Assistant

## 2020-03-14 NOTE — Telephone Encounter (Signed)
Patient called and was read lab not by Osvaldo Angst PA-C written 03/10/20. Patient verbalized understanding of all information.  Patient was requesting medication refill by doctor other than at the practice.  She was advised to call that office for refill request.  She has already spoken with pharmacy. She was requesting medication that was discontinued as combination medication Losartan /HCTZ 50- 12.5. Per records this was discontinued by her heart doctor. She will call him for instructions and refill.

## 2020-03-14 NOTE — Telephone Encounter (Signed)
Advised by [facility].

## 2020-03-29 ENCOUNTER — Other Ambulatory Visit: Payer: Self-pay | Admitting: Physician Assistant

## 2020-03-29 DIAGNOSIS — J449 Chronic obstructive pulmonary disease, unspecified: Secondary | ICD-10-CM

## 2020-03-29 NOTE — Telephone Encounter (Signed)
Requested Prescriptions  Pending Prescriptions Disp Refills  . SPIRIVA HANDIHALER 18 MCG inhalation capsule [Pharmacy Med Name: SPIRIVA CAPS 30S &HANDIHALER] 90 capsule 2    Sig: INHALE CONTENTS OF 1 CAPSULE ONCE DAILY USING HANDIHALER     Pulmonology:  Anticholinergic Agents Passed - 03/29/2020  9:48 AM      Passed - Valid encounter within last 12 months    Recent Outpatient Visits          1 month ago Chronic obstructive pulmonary disease, unspecified COPD type [facility])   [facility], Adriana M, PA-C   2 months ago Tachycardia   [facility] Osvaldo Angst M, PA-C   5 months ago Chronic obstructive pulmonary disease, unspecified COPD type [facility])   [facility], Ricki Rodriguez M, PA-C   6 months ago Chronic obstructive pulmonary disease, unspecified COPD type [facility])   [facility] [name], PA-C   1 year ago Annual physical exam   [facility] [name], [place]      Future Appointments            In 1 week [name], PA-C [facility], [facility]   In 1 month Debbe Odea, MD [facility]

## 2020-04-03 ENCOUNTER — Other Ambulatory Visit: Payer: Self-pay | Admitting: Physician Assistant

## 2020-04-09 NOTE — Progress Notes (Signed)
Established patient visit   Patient: Christy Mason   DOB: [birth year]   59 y.o. Female  MRN: [id number] Visit Date: 04/10/2020  Today's healthcare provider: [name], PA-C   Chief Complaint  Patient presents with  . Hypertension  I,Porsha C McClurkin,acting as a scribe for [employer], PA-C.,have documented all relevant documentation on the behalf of [name], PA-C,as directed by  [name], PA-C while in the presence of [name], PA-C.  Subjective    HPI  Hypertension, follow-up  BP Readings from Last 3 Encounters:  04/10/20 115/76  02/22/20 (!) 159/105  02/15/20 126/78   Wt Readings from Last 3 Encounters:  04/10/20 178 lb 9.6 oz (81 kg)  02/22/20 185 lb (83.9 kg)  02/15/20 183 lb 9.6 oz (83.3 kg)     She was last seen for hypertension 1 months ago.  BP at that visit was 126/78. Management since that visit includes holding BP meds. Patient reports she takes Losartan 50 mg daily.  She reports good compliance with treatment. She is not having side effects.  She is following a Regular diet. She is not exercising. She does not smoke.  Use of agents associated with hypertension: none.   Outside blood pressures are not being checked. Symptoms: No chest pain No chest pressure  No palpitations No syncope  No dyspnea No orthopnea  No paroxysmal nocturnal dyspnea No lower extremity edema   Pertinent labs: Lab Results  Component Value Date   CHOL 167 10/08/2019   HDL 37 (L) 10/08/2019   LDLCALC 101 (H) 10/08/2019   TRIG 124 01/03/2020   CHOLHDL 4.5 (H) 10/08/2019   Lab Results  Component Value Date   NA 140 02/15/2020   K 4.0 02/15/2020   CREATININE 1.19 (H) 02/15/2020   GFRNONAA 49 (L) 02/15/2020   GFRAA 57 (L) 02/15/2020   GLUCOSE 114 (H) 02/15/2020     The 10-year ASCVD risk score Denman George DC Jr., et al., 2013) is: 4.5%         Medications: Outpatient Medications Prior to Visit  Medication Sig  . ADVAIR DISKUS  250-50 MCG/DOSE AEPB INHALE 1 PUFF INTO THE LUNGS TWICE DAILY  . albuterol (PROVENTIL) (2.5 MG/3ML) 0.083% nebulizer solution Take 3 mLs (2.5 mg total) by nebulization every 4 (four) hours as needed for wheezing or shortness of breath.  Marland Kitchen aspirin EC 81 MG tablet Take 81 mg by mouth daily.  Marland Kitchen atorvastatin (LIPITOR) 20 MG tablet Take 1 tablet (20 mg total) by mouth daily.  . cetirizine (ZYRTEC) 10 MG tablet Take 10 mg by mouth daily.  . cholecalciferol (VITAMIN D3) 25 MCG (1000 UNIT) tablet Take 1,000 Units by mouth daily.  . clopidogrel (PLAVIX) 75 MG tablet Take 1 tablet (75 mg total) by mouth daily.  . diphenhydrAMINE HCl (ZZZQUIL) 50 MG/30ML LIQD Take 50 mg by mouth at bedtime.  . Multiple Vitamin (MULTIVITAMIN WITH MINERALS) TABS tablet Take 1 tablet by mouth daily.  . nicotine (NICODERM CQ - DOSED IN MG/24 HOURS) 21 mg/24hr patch APPLY 1 PATCH(21 MG) TOPICALLY TO THE SKIN DAILY  . ondansetron (ZOFRAN) 4 MG tablet Take 1 tablet (4 mg total) by mouth every 6 (six) hours as needed for nausea.  Marland Kitchen oxyCODONE (OXY IR/ROXICODONE) 5 MG immediate release tablet Take 1 tablet (5 mg total) by mouth every 6 (six) hours as needed for severe pain (headache).  Marland Kitchen PROAIR HFA 108 (90 Base) MCG/ACT inhaler INHALE 2 PUFFS INTO THE LUNGS  EVERY 6 HOURS AS NEEDED FOR WHEEZING OR SHORTNESS OF BREATH (Patient taking differently: Inhale 2 puffs into the lungs every 6 (six) hours as needed for wheezing or shortness of breath. )  . silver sulfADIAZINE (SILVADENE) 1 % cream Apply 1 application topically daily.  Marland Kitchen SPIRIVA HANDIHALER 18 MCG inhalation capsule INHALE CONTENTS OF 1 CAPSULE ONCE DAILY USING HANDIHALER  . SUBOXONE 8-2 MG FILM Take 0.5-1 Film by mouth in the morning and at bedtime.   . triamcinolone (NASACORT ALLERGY 24HR) 55 MCG/ACT AERO nasal inhaler Place 1 spray into the nose daily as needed (allergies).   . [DISCONTINUED] losartan (COZAAR) 25 MG tablet Take 1 tablet (25 mg total) by mouth daily.  . predniSONE  (DELTASONE) 10 MG tablet Take 3 tablets daily for 3 days  Then 2 tablets daily for 3 days then one tablet daily till done (Patient not taking: Reported on 04/10/2020)   No facility-administered medications prior to visit.    Review of Systems    Objective    BP 115/76 (BP Location: Left Arm, Patient Position: Sitting, Cuff Size: Large)   Pulse 87   Temp 98.2 F (36.8 C) (Oral)   Wt 178 lb 9.6 oz (81 kg)   SpO2 98%   BMI 32.67 kg/m    Physical Exam Constitutional:      Appearance: Normal appearance.  Cardiovascular:     Rate and Rhythm: Normal rate and regular rhythm.     Heart sounds: Normal heart sounds.  Pulmonary:     Effort: Pulmonary effort is normal.     Breath sounds: Normal breath sounds.  Skin:    General: Skin is warm and dry.  Neurological:     Mental Status: She is alert and oriented to person, place, and time. Mental status is at baseline.  Psychiatric:        Mood and Affect: Mood normal.        Behavior: Behavior normal.       No results found for any visits on 04/10/20.  Assessment & Plan    1. Essential hypertension  She increased her losartan to 50 mg QD since her bP was elevated and it is now controlled. Increase losartan from 25 to 50 mg daily.   - CBC with Differential/Platelet - losartan (COZAAR) 50 MG tablet; Take 1 tablet (50 mg total) by mouth daily.  Dispense: 90 tablet; Refill: 1 - Comprehensive Metabolic Panel (CMET)  2. Chronic obstructive pulmonary disease, unspecified COPD type (HCC)  She is due for Pneumovax but she is in the middle of covid shots so advised she may get it with her covid shot or we can get this next time. May also get flu shot with COVID shot.  3. Hyperlipidemia, unspecified hyperlipidemia type  - CBC with Differential/Platelet - Comprehensive Metabolic Panel (CMET)  4. Encounter for screening mammogram for malignant neoplasm of breast  - MM Digital Screening; Future  5. Colon cancer screening  -  Cologuard  6. Need for vaccination against Streptococcus pneumoniae     No follow-ups on file.      ITrey Sailors, PA-C, have reviewed all documentation for this visit. The documentation on 04/10/20 for the exam, diagnosis, procedures, and orders are all accurate and complete.  The entirety of the information documented in the History of Present Illness, Review of Systems and Physical Exam were personally obtained by me. Portions of this information were initially documented by [facility] and reviewed by me for thoroughness and accuracy.  Paulene Floor  [facility] [phone] (phone) [phone] (fax)  [place]

## 2020-04-10 ENCOUNTER — Encounter: Payer: Self-pay | Admitting: Physician Assistant

## 2020-04-10 ENCOUNTER — Ambulatory Visit (INDEPENDENT_AMBULATORY_CARE_PROVIDER_SITE_OTHER): Payer: Medicaid Other | Admitting: Physician Assistant

## 2020-04-10 ENCOUNTER — Other Ambulatory Visit: Payer: Self-pay

## 2020-04-10 VITALS — BP 115/76 | HR 87 | Temp 98.2°F | Wt 178.6 lb

## 2020-04-10 DIAGNOSIS — Z23 Encounter for immunization: Secondary | ICD-10-CM

## 2020-04-10 DIAGNOSIS — J449 Chronic obstructive pulmonary disease, unspecified: Secondary | ICD-10-CM | POA: Diagnosis not present

## 2020-04-10 DIAGNOSIS — Z1231 Encounter for screening mammogram for malignant neoplasm of breast: Secondary | ICD-10-CM

## 2020-04-10 DIAGNOSIS — E785 Hyperlipidemia, unspecified: Secondary | ICD-10-CM | POA: Diagnosis not present

## 2020-04-10 DIAGNOSIS — I10 Essential (primary) hypertension: Secondary | ICD-10-CM | POA: Diagnosis not present

## 2020-04-10 DIAGNOSIS — Z1211 Encounter for screening for malignant neoplasm of colon: Secondary | ICD-10-CM

## 2020-04-10 MED ORDER — LOSARTAN POTASSIUM 50 MG PO TABS: 50.0000 mg | ORAL_TABLET | Freq: Every day | ORAL | 1 refills | Status: DC

## 2020-04-10 NOTE — Patient Instructions (Signed)
You are due for your pneumonia shot pneumovax 23 and also your flu shot.

## 2020-04-11 ENCOUNTER — Telehealth: Payer: Self-pay

## 2020-04-11 LAB — CBC WITH DIFFERENTIAL/PLATELET
Basophils Absolute: 0.1 10*3/uL (ref 0.0–0.2)
Basos: 1 %
EOS (ABSOLUTE): 0.5 10*3/uL — ABNORMAL HIGH (ref 0.0–0.4)
Eos: 5 %
Hematocrit: 37.1 % (ref 34.0–46.6)
Hemoglobin: 12.5 g/dL (ref 11.1–15.9)
Immature Grans (Abs): 0 10*3/uL (ref 0.0–0.1)
Immature Granulocytes: 0 %
Lymphocytes Absolute: 2.5 10*3/uL (ref 0.7–3.1)
Lymphs: 29 %
MCH: 28.6 pg (ref 26.6–33.0)
MCHC: 33.7 g/dL (ref 31.5–35.7)
MCV: 85 fL (ref 79–97)
Monocytes Absolute: 0.9 10*3/uL (ref 0.1–0.9)
Monocytes: 10 %
Neutrophils Absolute: 4.7 10*3/uL (ref 1.4–7.0)
Neutrophils: 55 %
Platelets: 239 10*3/uL (ref 150–450)
RBC: 4.37 x10E6/uL (ref 3.77–5.28)
RDW: 14.1 % (ref 11.7–15.4)
WBC: 8.6 10*3/uL (ref 3.4–10.8)

## 2020-04-11 LAB — COMPREHENSIVE METABOLIC PANEL
ALT: 17 IU/L (ref 0–32)
AST: 32 IU/L (ref 0–40)
Albumin/Globulin Ratio: 1.3 (ref 1.2–2.2)
Albumin: 3.8 g/dL (ref 3.8–4.8)
Alkaline Phosphatase: 127 IU/L — ABNORMAL HIGH (ref 44–121)
BUN/Creatinine Ratio: 14 (ref 12–28)
BUN: 11 mg/dL (ref 8–27)
Bilirubin Total: 0.2 mg/dL (ref 0.0–1.2)
CO2: 26 mmol/L (ref 20–29)
Calcium: 9.3 mg/dL (ref 8.7–10.3)
Chloride: 100 mmol/L (ref 96–106)
Creatinine, Ser: 0.78 mg/dL (ref 0.57–1.00)
GFR calc Af Amer: 95 mL/min/{1.73_m2} (ref >59)
GFR calc non Af Amer: 82 mL/min/{1.73_m2} (ref >59)
Globulin, Total: 2.9 g/dL (ref 1.5–4.5)
Glucose: 92 mg/dL (ref 65–99)
Potassium: 4.2 mmol/L (ref 3.5–5.2)
Sodium: 138 mmol/L (ref 134–144)
Total Protein: 6.7 g/dL (ref 6.0–8.5)

## 2020-04-11 NOTE — Telephone Encounter (Signed)
See below. KW 

## 2020-04-11 NOTE — Telephone Encounter (Signed)
Left message to call back. Ok for [facility] to advise pt. 

## 2020-04-11 NOTE — Telephone Encounter (Signed)
-----   Message from [name], [place] sent at 04/11/2020  8:20 AM EDT ----- Kidney function normalized, remainder of labs stable.

## 2020-05-15 ENCOUNTER — Ambulatory Visit: Payer: Medicaid Other | Admitting: Cardiology

## 2020-05-16 ENCOUNTER — Encounter: Payer: Self-pay | Admitting: Cardiology

## 2020-05-23 ENCOUNTER — Other Ambulatory Visit (INDEPENDENT_AMBULATORY_CARE_PROVIDER_SITE_OTHER): Payer: Self-pay | Admitting: Nurse Practitioner

## 2020-05-23 DIAGNOSIS — I70208 Unspecified atherosclerosis of native arteries of extremities, other extremity: Secondary | ICD-10-CM

## 2020-05-23 DIAGNOSIS — Z9862 Peripheral vascular angioplasty status: Secondary | ICD-10-CM

## 2020-05-23 DIAGNOSIS — I771 Stricture of artery: Secondary | ICD-10-CM

## 2020-05-26 ENCOUNTER — Other Ambulatory Visit (INDEPENDENT_AMBULATORY_CARE_PROVIDER_SITE_OTHER): Payer: Medicaid Other

## 2020-05-26 ENCOUNTER — Ambulatory Visit (INDEPENDENT_AMBULATORY_CARE_PROVIDER_SITE_OTHER): Payer: Medicaid Other | Admitting: Vascular Surgery

## 2020-06-08 ENCOUNTER — Other Ambulatory Visit: Payer: Self-pay | Admitting: Physician Assistant

## 2020-06-08 NOTE — Telephone Encounter (Signed)
Requested Prescriptions  Pending Prescriptions Disp Refills   ADVAIR DISKUS 250-50 MCG/DOSE AEPB [Pharmacy Med Name: ADVAIR DISKUS 250/50MCG (YELLOW) 60] 60 each 1    Sig: INHALE 1 PUFF INTO THE LUNGS TWICE DAILY     Pulmonology:  Combination Products Passed - 06/08/2020 10:27 AM      Passed - Valid encounter within last 12 months    Recent Outpatient Visits          1 month ago Essential hypertension   [facility], Adriana M, PA-C   3 months ago Chronic obstructive pulmonary disease, unspecified COPD type [facility])   [facility] [place], Ricki Rodriguez M, PA-C   4 months ago Tachycardia   [facility] Osvaldo Angst M, PA-C   8 months ago Chronic obstructive pulmonary disease, unspecified COPD type [facility])   [facility] [place], Ricki Rodriguez M, PA-C   9 months ago Chronic obstructive pulmonary disease, unspecified COPD type [facility])   [facility] [name], [place]      Future Appointments            In 4 months Jodi Marble, Lavella Hammock, PA-C [facility], [facility]

## 2020-06-09 ENCOUNTER — Other Ambulatory Visit: Payer: Self-pay | Admitting: *Deleted

## 2020-06-09 MED ORDER — ATORVASTATIN CALCIUM 20 MG PO TABS: 20.0000 mg | ORAL_TABLET | Freq: Every day | ORAL | 0 refills | Status: DC

## 2020-06-26 IMAGING — DX PORTABLE CHEST - 1 VIEW
1 series · 1 of 1 positions shown · non-contrast
Comparison: 06/23/2016 report.

CLINICAL DATA: Fever.

EXAM:
PORTABLE CHEST 1 VIEW

[chest ap]
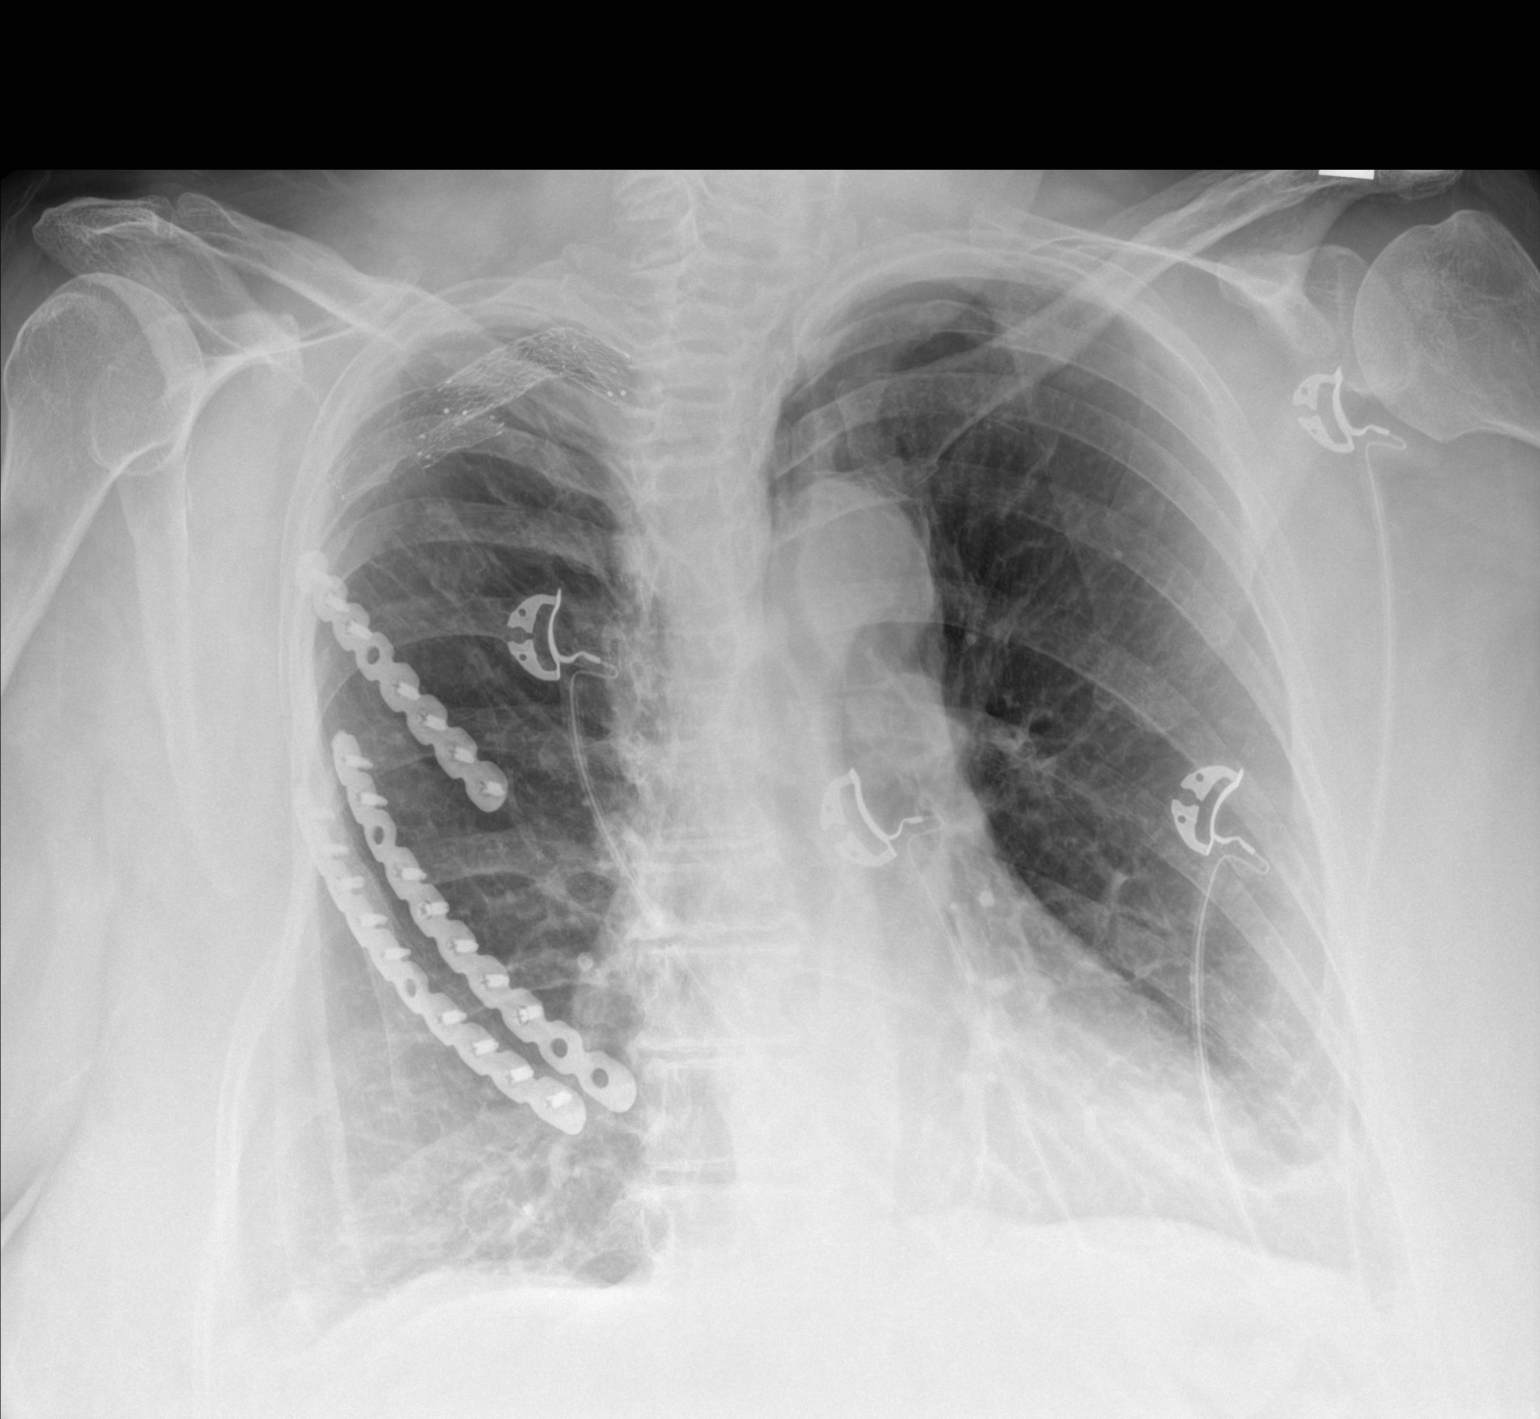

[1 of 1 positions shown; findings below may reference images not displayed]

FINDINGS: Multiple stents are noted over the right subclavian region. A
smaller stent is noted adjacent to the larger subclavian stents.
Heart size normal. No pulmonary venous congestion. No focal
infiltrate. No pleural effusion or pneumothorax. Prior plate screw
fixation of multiple right ribs.
IMPRESSION: No acute cardiopulmonary disease.  Right subclavian stents as above.

## 2020-07-06 ENCOUNTER — Other Ambulatory Visit: Payer: Self-pay | Admitting: Physician Assistant

## 2020-07-06 DIAGNOSIS — F32A Depression, unspecified: Secondary | ICD-10-CM

## 2020-07-25 ENCOUNTER — Telehealth: Payer: Self-pay

## 2020-07-25 NOTE — Telephone Encounter (Signed)
Copied from CRM [id number]. Topic: General - Other >> Jul 25, 2020  2:07 PM Laural Benes, [place] C wrote: Reason for CRM: pt called in to request a Rx for sinus pressure. Assisted pt with scheduling next available apt, pt would like to know if provider could send in a Rx for sinus pressure to her pharmacy to help her?    Please advise.

## 2020-07-28 ENCOUNTER — Ambulatory Visit (INDEPENDENT_AMBULATORY_CARE_PROVIDER_SITE_OTHER): Payer: Medicaid Other | Admitting: Adult Health

## 2020-07-28 DIAGNOSIS — Z5329 Procedure and treatment not carried out because of patient's decision for other reasons: Secondary | ICD-10-CM

## 2020-07-28 NOTE — Telephone Encounter (Signed)
Needs OV to assess if abx are appropriate, thanks.

## 2020-07-29 NOTE — Telephone Encounter (Signed)
Patient was advised and scheduled appointment for tomorrow, 07/30/2020 @ 9:40 AM. She is to let you know that she waited yesterday, 07/28/2020 morning for someone to call her for her visit that was scheduled and she never received the call. Just a [place]

## 2020-07-29 NOTE — Telephone Encounter (Signed)
We were not open yesterday until 1 pm nor do I see patients on any Monday until 1 pm. I do not see her on my schedule. I apologize for that, it must have been an error.

## 2020-07-29 NOTE — Telephone Encounter (Signed)
She was on [place] schedule

## 2020-07-30 ENCOUNTER — Telehealth (INDEPENDENT_AMBULATORY_CARE_PROVIDER_SITE_OTHER): Payer: Medicaid Other | Admitting: Physician Assistant

## 2020-07-30 DIAGNOSIS — J441 Chronic obstructive pulmonary disease with (acute) exacerbation: Secondary | ICD-10-CM | POA: Diagnosis not present

## 2020-07-30 MED ORDER — PREDNISONE 20 MG PO TABS: 20.0000 mg | ORAL_TABLET | Freq: Every day | ORAL | 0 refills | Status: DC

## 2020-07-30 MED ORDER — DOXYCYCLINE HYCLATE 100 MG PO TABS: 100.0000 mg | ORAL_TABLET | Freq: Two times a day (BID) | ORAL | 0 refills | Status: AC

## 2020-07-30 NOTE — Progress Notes (Signed)
MyChart Video Visit    Virtual Visit via Video Note   This visit type was conducted due to national recommendations for restrictions regarding the COVID-19 Pandemic (e.g. social distancing) in an effort to limit this patient's exposure and mitigate transmission in our community. This patient is at least at moderate risk for complications without adequate follow up. This format is felt to be most appropriate for this patient at this time. Physical exam was limited by quality of the video and audio technology used for the visit.   Patient location: Home Provider location: Office   I discussed the limitations of evaluation and management by telemedicine and the availability of in person appointments. The patient expressed understanding and agreed to proceed.  Patient: Christy Mason   DOB: Mar 09, 1961   60 y.o. Female  MRN: [id number] Visit Date: 07/30/2020  Today's healthcare provider: [name], PA-C   Chief Complaint  Patient presents with  . Ear Fullness  I,Kalden Wanke M Kieanna Rollo,acting as a scribe for [name], PA-C.,have documented all relevant documentation on the behalf of [name], PA-C,as directed by  [name], PA-C while in the presence of [name], PA-C.  Subjective    Ear Fullness  There is pain in the left ear. This is a new problem. The current episode started 1 to 4 weeks ago. The problem occurs constantly. The problem has been unchanged. There has been no fever. The pain is at a severity of 0/10. The patient is experiencing no pain. Associated symptoms include coughing, ear discharge, headaches and rhinorrhea. Pertinent negatives include no diarrhea, sore throat or vomiting. She has tried nothing for the symptoms. The treatment provided no relief.    Reports she has had congestion for > 1 week, about two weeks. She has worsening congestion over this time. She does not have fevers or SOB. Her oxygen has been about 96-97%. She denies fevers  and shortness of breath. She does have a history of COPD.    Medications: Outpatient Medications Prior to Visit  Medication Sig  . ADVAIR DISKUS 250-50 MCG/DOSE AEPB INHALE 1 PUFF INTO THE LUNGS TWICE DAILY  . albuterol (PROVENTIL) (2.5 MG/3ML) 0.083% nebulizer solution Take 3 mLs (2.5 mg total) by nebulization every 4 (four) hours as needed for wheezing or shortness of breath.  Marland Kitchen aspirin EC 81 MG tablet Take 81 mg by mouth daily.  Marland Kitchen atorvastatin (LIPITOR) 20 MG tablet Take 1 tablet (20 mg total) by mouth daily.  . cetirizine (ZYRTEC) 10 MG tablet Take 10 mg by mouth daily.  . cholecalciferol (VITAMIN D3) 25 MCG (1000 UNIT) tablet Take 1,000 Units by mouth daily.  . clopidogrel (PLAVIX) 75 MG tablet Take 1 tablet (75 mg total) by mouth daily.  . diphenhydrAMINE HCl (ZZZQUIL) 50 MG/30ML LIQD Take 50 mg by mouth at bedtime.  Marland Kitchen losartan (COZAAR) 50 MG tablet Take 1 tablet (50 mg total) by mouth daily.  . Multiple Vitamin (MULTIVITAMIN WITH MINERALS) TABS tablet Take 1 tablet by mouth daily.  . nicotine (NICODERM CQ - DOSED IN MG/24 HOURS) 21 mg/24hr patch APPLY 1 PATCH(21 MG) TOPICALLY TO THE SKIN DAILY  . ondansetron (ZOFRAN) 4 MG tablet Take 1 tablet (4 mg total) by mouth every 6 (six) hours as needed for nausea.  Marland Kitchen oxyCODONE (OXY IR/ROXICODONE) 5 MG immediate release tablet Take 1 tablet (5 mg total) by mouth every 6 (six) hours as needed for severe pain (headache).  Marland Kitchen PROAIR HFA 108 (90 Base) MCG/ACT  inhaler INHALE 2 PUFFS INTO THE LUNGS EVERY 6 HOURS AS NEEDED FOR WHEEZING OR SHORTNESS OF BREATH (Patient taking differently: Inhale 2 puffs into the lungs every 6 (six) hours as needed for wheezing or shortness of breath.)  . silver sulfADIAZINE (SILVADENE) 1 % cream Apply 1 application topically daily.  Marland Kitchen SPIRIVA HANDIHALER 18 MCG inhalation capsule INHALE CONTENTS OF 1 CAPSULE ONCE DAILY USING HANDIHALER  . SUBOXONE 8-2 MG FILM Take 0.5-1 Film by mouth in the morning and at bedtime.   .  triamcinolone (NASACORT) 55 MCG/ACT AERO nasal inhaler Place 1 spray into the nose daily as needed (allergies).   . [DISCONTINUED] predniSONE (DELTASONE) 10 MG tablet Take 3 tablets daily for 3 days  Then 2 tablets daily for 3 days then one tablet daily till done (Patient not taking: Reported on 07/30/2020)   No facility-administered medications prior to visit.    Review of Systems  Constitutional: Negative for chills, fatigue and fever.  HENT: Positive for congestion, ear discharge, ear pain, postnasal drip, rhinorrhea, sinus pressure, sinus pain and sneezing. Negative for sore throat.   Respiratory: Positive for cough, chest tightness, shortness of breath and wheezing.   Gastrointestinal: Negative for diarrhea, nausea and vomiting.  Neurological: Positive for headaches.      Objective    There were no vitals taken for this visit.   Physical Exam Constitutional:      Appearance: Normal appearance.  Pulmonary:     Effort: Pulmonary effort is normal. No respiratory distress.  Neurological:     Mental Status: She is alert.  Psychiatric:        Mood and Affect: Mood normal.        Behavior: Behavior normal.        Assessment & Plan    1. COPD with acute exacerbation (HCC)  - COVID-19, Flu A+B and RSV - doxycycline (VIBRA-TABS) 100 MG tablet; Take 1 tablet (100 mg total) by mouth 2 (two) times daily for 7 days.  Dispense: 14 tablet; Refill: 0 - predniSONE (DELTASONE) 20 MG tablet; Take 1 tablet (20 mg total) by mouth daily with breakfast.  Dispense: 5 tablet; Refill: 0   Return if symptoms worsen or fail to improve.     I discussed the assessment and treatment plan with the patient. The patient was provided an opportunity to ask questions and all were answered. The patient agreed with the plan and demonstrated an understanding of the instructions.   The patient was advised to call back or seek an in-person evaluation if the symptoms worsen or if the condition fails to  improve as anticipated.   ITrey Sailors, PA-C, have reviewed all documentation for this visit. The documentation on 07/30/20 for the exam, diagnosis, procedures, and orders are all accurate and complete.  The entirety of the information documented in the History of Present Illness, Review of Systems and Physical Exam were personally obtained by me. Portions of this information were initially documented by [facility] and reviewed by me for thoroughness and accuracy.    Maryella Shivers [facility] [phone] (phone) [phone] (fax)  [facility]

## 2020-08-05 NOTE — Progress Notes (Signed)
No show

## 2020-10-03 ENCOUNTER — Other Ambulatory Visit: Payer: Self-pay | Admitting: Physician Assistant

## 2020-10-03 DIAGNOSIS — I10 Essential (primary) hypertension: Secondary | ICD-10-CM

## 2020-10-06 ENCOUNTER — Ambulatory Visit: Payer: Self-pay | Admitting: Physician Assistant

## 2020-10-14 ENCOUNTER — Other Ambulatory Visit: Payer: Self-pay | Admitting: Physician Assistant

## 2020-10-14 DIAGNOSIS — J449 Chronic obstructive pulmonary disease, unspecified: Secondary | ICD-10-CM

## 2020-10-14 MED ORDER — ADVAIR DISKUS 250-50 MCG/DOSE IN AEPB: INHALATION_SPRAY | RESPIRATORY_TRACT | 0 refills | Status: DC

## 2020-10-14 NOTE — Telephone Encounter (Signed)
Medication Refill - Medication: ADVAIR DISKUS 250-50 MCG/DOSE AEPB (completely out) , patient scheduled for 10/28/2020    Has the patient contacted their pharmacy? Yes.    (Agent: If yes, when and what did the pharmacy advise?) Rx was denied and to contact PCP office  Preferred Pharmacy (with phone number or street name):  [id number] Nich[place] Rough, [place] - [address] AT [facility] OF [address] Phone:  [phone]  Fax:  [phone]       Agent: Please be advised that RX refills may take up to 3 business days. We ask that you follow-up with your pharmacy.

## 2020-10-20 ENCOUNTER — Other Ambulatory Visit: Payer: Self-pay | Admitting: Physician Assistant

## 2020-10-20 DIAGNOSIS — J449 Chronic obstructive pulmonary disease, unspecified: Secondary | ICD-10-CM

## 2020-10-20 MED ORDER — SPIRIVA HANDIHALER 18 MCG IN CAPS: ORAL_CAPSULE | RESPIRATORY_TRACT | 0 refills | Status: DC

## 2020-10-20 NOTE — Telephone Encounter (Signed)
   Notes to clinic:  Patient is requesting refill on medication early. Patient states that she is out Review for refill Last filled 10/03/2020   Requested Prescriptions  Pending Prescriptions Disp Refills   SPIRIVA HANDIHALER 18 MCG inhalation capsule 90 capsule 2    Sig: INHALE CONTENTS OF 1 CAPSULE ONCE DAILY USING HANDIHALER      Pulmonology:  Anticholinergic Agents Passed - 10/20/2020  2:17 PM      Passed - Valid encounter within last 12 months    Recent Outpatient Visits           2 months ago COPD with acute exacerbation [facility])   [facility] [place], Lavella Hammock, PA-C   2 months ago No-show for appointment   [facility] [place], Eula Fried, FNP   6 months ago Essential hypertension   [facility] Osvaldo Angst M, PA-C   8 months ago Chronic obstructive pulmonary disease, unspecified COPD type [facility])   [facility] [place], Ricki Rodriguez M, [place]   9 months ago Tachycardia   [facility] [place], Lavella Hammock, [place]       Future Appointments             In 1 week Bacigalupo, Marzella Schlein, MD [facility]

## 2020-10-20 NOTE — Telephone Encounter (Signed)
Medication Refill - Medication:   SPIRIVA HANDIHALER 18 MCG inhalation capsule   Has the patient contacted their pharmacy? Yes.  no refills left, pt is completely out of the medication. Pt also has future appt scheduled.   Preferred Pharmacy (with phone number or street name):   [facility] #[id number] Nicholes Rough, [place] - [address] AT [facility] OF [address]  [address] [place] [address]  Phone: [phone] Fax: [phone]   Agent: Please be advised that RX refills may take up to 3 business days. We ask that you follow-up with your pharmacy.

## 2020-10-28 ENCOUNTER — Ambulatory Visit: Payer: Medicaid Other | Admitting: Family Medicine

## 2020-10-28 ENCOUNTER — Encounter: Payer: Self-pay | Admitting: Pulmonary Disease

## 2020-10-28 NOTE — Progress Notes (Signed)
Patient of Dr. Belia Heman is here for 6-minute walk only.  Patient was able to complete 11 laps with shortness of breath and wheezing.  No desaturations during exercise.  O2 sat baseline 96% on room air nadir 95%.  Heart rate 94% baseline, heart rate 125 at peak exercise.  Dyspnea Borg scale 5.  Calculated distance ambulated 350 m.  Gailen Shelter, MD [facility]

## 2020-11-02 ENCOUNTER — Other Ambulatory Visit: Payer: Self-pay | Admitting: Internal Medicine

## 2020-11-02 DIAGNOSIS — J449 Chronic obstructive pulmonary disease, unspecified: Secondary | ICD-10-CM

## 2020-11-10 ENCOUNTER — Other Ambulatory Visit: Payer: Self-pay | Admitting: Family Medicine

## 2020-11-24 ENCOUNTER — Other Ambulatory Visit: Payer: Self-pay

## 2020-11-24 ENCOUNTER — Encounter: Payer: Self-pay | Admitting: Family Medicine

## 2020-11-24 ENCOUNTER — Ambulatory Visit: Payer: Medicaid Other | Admitting: Family Medicine

## 2020-11-24 VITALS — BP 138/86 | HR 86 | Temp 98.4°F | Resp 20 | Ht 62.0 in | Wt 198.7 lb

## 2020-11-24 DIAGNOSIS — D692 Other nonthrombocytopenic purpura: Secondary | ICD-10-CM | POA: Diagnosis not present

## 2020-11-24 DIAGNOSIS — E782 Mixed hyperlipidemia: Secondary | ICD-10-CM | POA: Diagnosis not present

## 2020-11-24 DIAGNOSIS — I771 Stricture of artery: Secondary | ICD-10-CM

## 2020-11-24 DIAGNOSIS — Z1211 Encounter for screening for malignant neoplasm of colon: Secondary | ICD-10-CM | POA: Diagnosis not present

## 2020-11-24 DIAGNOSIS — J449 Chronic obstructive pulmonary disease, unspecified: Secondary | ICD-10-CM

## 2020-11-24 DIAGNOSIS — I10 Essential (primary) hypertension: Secondary | ICD-10-CM | POA: Diagnosis not present

## 2020-11-24 DIAGNOSIS — Z6833 Body mass index (BMI) 33.0-33.9, adult: Secondary | ICD-10-CM | POA: Insufficient documentation

## 2020-11-24 MED ORDER — ADVAIR DISKUS 250-50 MCG/ACT IN AEPB: INHALATION_SPRAY | RESPIRATORY_TRACT | 1 refills | Status: DC

## 2020-11-24 MED ORDER — SPIRIVA HANDIHALER 18 MCG IN CAPS: ORAL_CAPSULE | RESPIRATORY_TRACT | 1 refills | Status: DC

## 2020-11-24 MED ORDER — ATORVASTATIN CALCIUM 20 MG PO TABS: 20.0000 mg | ORAL_TABLET | Freq: Every day | ORAL | 3 refills | Status: DC

## 2020-11-24 MED ORDER — LOSARTAN POTASSIUM 50 MG PO TABS: ORAL_TABLET | ORAL | 3 refills | Status: DC

## 2020-11-24 NOTE — Progress Notes (Signed)
Established patient visit   Patient: Christy Mason   DOB: 18-Jan-1961   60 y.o. Female  MRN: [id number] Visit Date: 11/24/2020  Today's healthcare provider: Shirlee LatchAngela Devany Aja, MD   Chief Complaint  Patient presents with  . Hypertension  . COPD   Subjective    Hypertension Associated symptoms include shortness of breath. Pertinent negatives include no chest pain, headaches or palpitations.  COPD She complains of shortness of breath. There is no cough or wheezing. Pertinent negatives include no appetite change, chest pain, ear pain, fever, headaches, rhinorrhea or sore throat. Her past medical history is significant for COPD.     Hypertension, follow-up  BP Readings from Last 3 Encounters:  11/24/20 138/86  04/10/20 115/76  02/22/20 (!) 159/105   Wt Readings from Last 3 Encounters:  11/24/20 198 lb 11.2 oz (90.1 kg)  04/10/20 178 lb 9.6 oz (81 kg)  02/22/20 185 lb (83.9 kg)     She was last seen for hypertension 6 months ago.  BP at that visit was 115/76. Management since that visit includes increased losartan to 50 mg daily daily.  She reports excellent compliance with treatment. She is not having side effects.  She is following a Regular diet. She is not exercising. She does not smoke.  Use of agents associated with hypertension: none.   Outside blood pressures are stable. Symptoms: No chest pain No chest pressure  No palpitations No syncope  Yes dyspnea No orthopnea  No paroxysmal nocturnal dyspnea No lower extremity edema   Pertinent labs: Lab Results  Component Value Date   CHOL 167 10/08/2019   HDL 37 (L) 10/08/2019   LDLCALC 101 (H) 10/08/2019   TRIG 124 01/03/2020   CHOLHDL 4.5 (H) 10/08/2019   Lab Results  Component Value Date   NA 138 04/10/2020   K 4.2 04/10/2020   CREATININE 0.78 04/10/2020   GFRNONAA 82 04/10/2020   GFRAA 95 04/10/2020   GLUCOSE 92 04/10/2020     The 10-year ASCVD risk score Denman George(Goff DC Jr., et al., 2013) is: 6.4%    --------------------------------------------------------------------------------------------------- COPD, Follow up  She was last seen for this 6 months ago. Changes made include no changes.   She reports excellent compliance with treatment. She is not having side effects.  she uses rescue inhaler 2 per months. She IS experiencing dyspnea and weight increase. She is NOT experiencing cough, wheezing, chills or fever. she reports breathing is Unchanged.  Pulmonary Functions Testing Results:  TLC  Date Value Ref Range Status  11/20/2019 4.94 L Preliminary  She states that she wishes she could start exercising but when she does she feels as if she cant breath. She states that this is hindering her form losing weight.  -----------------------------------------------------------------------------------------   Patient Active Problem List   Diagnosis Date Noted  . Mixed hyperlipidemia 11/24/2020  . Morbid obesity (HCC) 11/24/2020  . Senile purpura (HCC) 11/24/2020  . Subclavian arterial stenosis (HCC) 01/01/2020  . Thoracic radiculitis 03/12/2019  . 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 COPD (chronic obstructive pulmonary disease) (HCC) 01/08/2019  . Essential hypertension 01/08/2019   Social History   Tobacco Use  . Smoking status: Former Smoker    Packs/day: 1.00    Years: 45.00    Pack years: 45.00    Types: Cigarettes    Start date: 1976  . Smokeless tobacco: Never Used  . Tobacco comment: quit for 16 years started again 07/2014  Vaping Use  . Vaping Use: Former  Substance Use Topics  . Alcohol use: Not Currently  . Drug use: Not Currently   Allergies  Allergen Reactions  . Acetaminophen     Avoid due to fatty liver   . Nsaids Other (See Comments)    irritates stomach severley   . Other     Steroids-muscle weakness and headache  . Sular [Nisoldipine Er] Palpitations        Medications: Outpatient Medications Prior to Visit  Medication Sig  . albuterol (PROVENTIL) (2.5 MG/3ML) 0.083% nebulizer solution USE 1 VIAL VIA NEBULIZER EVERY 4 HOURS AS NEEDED FOR WHEEZING OR SHORTNESS OF BREATH  . aspirin EC 81 MG tablet Take 81 mg by mouth daily.  . cetirizine (ZYRTEC) 10 MG tablet Take 10 mg by mouth daily.  . cholecalciferol (VITAMIN D3) 25 MCG (1000 UNIT) tablet Take 1,000 Units by mouth daily.  . clopidogrel (PLAVIX) 75 MG tablet Take 1 tablet (75 mg total) by mouth daily.  . diphenhydrAMINE HCl (ZZZQUIL) 50 MG/30ML LIQD Take 50 mg by mouth at bedtime.  . Multiple Vitamin (MULTIVITAMIN WITH MINERALS) TABS tablet Take 1 tablet by mouth daily.  . ondansetron (ZOFRAN) 4 MG tablet Take 1 tablet (4 mg total) by mouth every 6 (six) hours as needed for nausea.  Marland Kitchen PROAIR HFA 108 (90 Base) MCG/ACT inhaler INHALE 2 PUFFS INTO THE LUNGS EVERY 6 HOURS AS NEEDED FOR WHEEZING OR SHORTNESS OF BREATH (Patient taking differently: Inhale 2 puffs into the lungs every 6 (six) hours as needed for wheezing or shortness of breath.)  . SUBOXONE 8-2 MG FILM Take 0.5-1 Film by mouth in the morning and at bedtime.   . triamcinolone (NASACORT) 55 MCG/ACT AERO nasal inhaler Place 1 spray into the nose daily as needed (allergies).   . [DISCONTINUED] ADVAIR DISKUS 250-50 MCG/ACT AEPB INHALE 1 PUFF INTO THE LUNGS TWICE DAILY  . [DISCONTINUED] atorvastatin (LIPITOR) 20 MG tablet Take 1 tablet (20 mg total) by mouth daily.  . [DISCONTINUED] losartan (COZAAR) 50 MG tablet TAKE 1 TABLET(50 MG) BY MOUTH DAILY  . [DISCONTINUED] SPIRIVA HANDIHALER 18 MCG inhalation capsule INHALE CONTENTS OF 1 CAPSULE ONCE DAILY USING HANDIHALER  . [DISCONTINUED] nicotine (NICODERM CQ - DOSED IN MG/24 HOURS) 21 mg/24hr patch APPLY 1 PATCH(21 MG) TOPICALLY TO THE SKIN DAILY (Patient not taking: Reported on 11/24/2020)  . [DISCONTINUED] oxyCODONE (OXY IR/ROXICODONE) 5 MG immediate release tablet Take 1 tablet (5 mg total) by  mouth every 6 (six) hours as needed for severe pain (headache). (Patient not taking: Reported on 11/24/2020)  . [DISCONTINUED] predniSONE (DELTASONE) 20 MG tablet Take 1 tablet (20 mg total) by mouth daily with breakfast. (Patient not taking: Reported on 11/24/2020)  . [DISCONTINUED] silver sulfADIAZINE (SILVADENE) 1 % cream Apply 1 application topically daily. (Patient not taking: Reported on 11/24/2020)   No facility-administered medications prior to visit.    Review of Systems  Constitutional: Negative for activity change, appetite change, chills, fatigue and fever.  HENT: Negative for congestion, ear pain, rhinorrhea, sinus pain and sore throat.   Respiratory: Positive for shortness of breath. Negative for cough, chest tightness and wheezing.   Cardiovascular: Negative for chest pain, palpitations and leg swelling.  Gastrointestinal: Negative for abdominal pain, blood in stool, diarrhea, nausea and vomiting.  Genitourinary: Negative for dysuria, flank pain, frequency and urgency.  Neurological: Negative for dizziness and headaches.         Objective    BP 138/86 (BP Location: Left Arm, Patient Position:  Sitting, Cuff Size: Large)   Pulse 86   Temp 98.4 F (36.9 C) (Oral)   Resp 20   Ht 5\' 2"  (1.575 m)   Wt 198 lb 11.2 oz (90.1 kg)   SpO2 97%   BMI 36.34 kg/m  BP Readings from Last 3 Encounters:  11/24/20 138/86  04/10/20 115/76  02/22/20 (!) 159/105   Wt Readings from Last 3 Encounters:  11/24/20 198 lb 11.2 oz (90.1 kg)  04/10/20 178 lb 9.6 oz (81 kg)  02/22/20 185 lb (83.9 kg)      Physical Exam Vitals reviewed.  Constitutional:      General: She is not in acute distress.    Appearance: Normal appearance. She is well-developed. She is not diaphoretic.  HENT:     Head: Normocephalic and atraumatic.  Eyes:     General: No scleral icterus.    Conjunctiva/sclera: Conjunctivae normal.  Neck:     Thyroid: No thyromegaly.  Cardiovascular:     Rate and Rhythm:  Normal rate and regular rhythm.     Pulses: Normal pulses.     Heart sounds: Normal heart sounds. No murmur heard.   Pulmonary:     Effort: Pulmonary effort is normal. No respiratory distress.     Breath sounds: Normal breath sounds. No wheezing, rhonchi or rales.  Musculoskeletal:     Cervical back: Neck supple.     Right lower leg: No edema.     Left lower leg: No edema.  Lymphadenopathy:     Cervical: No cervical adenopathy.  Skin:    General: Skin is warm and dry.     Findings: No rash.  Neurological:     Mental Status: She is alert and oriented to person, place, and time. Mental status is at baseline.  Psychiatric:        Mood and Affect: Mood normal.        Behavior: Behavior normal.      No results found for any visits on 11/24/20.  Assessment & Plan     Problem List Items Addressed This Visit      Cardiovascular and Mediastinum   Essential hypertension - Primary    Well controlled Continue current medications Recheck metabolic panel F/u in 6 months       Relevant Medications   atorvastatin (LIPITOR) 20 MG tablet   losartan (COZAAR) 50 MG tablet   Other Relevant Orders   Comprehensive metabolic panel   Subclavian arterial stenosis (HCC)    F/b VVS      Relevant Medications   atorvastatin (LIPITOR) 20 MG tablet   losartan (COZAAR) 50 MG tablet   Senile purpura (HCC)    Continue to monitor      Relevant Medications   atorvastatin (LIPITOR) 20 MG tablet   losartan (COZAAR) 50 MG tablet     Respiratory   COPD (chronic obstructive pulmonary disease) (HCC)    Chronic and stable at this point No changes to medications today      Relevant Medications   ADVAIR DISKUS 250-50 MCG/ACT AEPB   SPIRIVA HANDIHALER 18 MCG inhalation capsule     Other   Mixed hyperlipidemia    Reviewed last lipid panel Not currently on a statin Recheck FLP and CMP Discussed diet and exercise      Relevant Medications   atorvastatin (LIPITOR) 20 MG tablet   losartan  (COZAAR) 50 MG tablet   Other Relevant Orders   Comprehensive metabolic panel   Lipid panel   Morbid obesity (HCC)  BMI >36 and associated with HTN, HLD, COPD, Subclavian artery stenosis Discussed importance of healthy weight management Discussed diet and exercise        Other Visit Diagnoses    Screen for colon cancer       Relevant Orders   Cologuard       Return in about 3 months (around 02/24/2021) for chronic disease f/u.       Danelle Earthly [place],acting as a Neurosurgeon for Shirlee Latch, MD.,have documented all relevant documentation on the behalf of Shirlee Latch, MD,as directed by  Shirlee Latch, MD while in the presence of Shirlee Latch, MD.  I, Shirlee Latch, MD, have reviewed all documentation for this visit. The documentation on 11/24/20 for the exam, diagnosis, procedures, and orders are all accurate and complete.   Dravon Nott, Marzella Schlein, MD, MPH [facility]

## 2020-11-24 NOTE — Assessment & Plan Note (Signed)
BMI >36 and associated with HTN, HLD, COPD, Subclavian artery stenosis Discussed importance of healthy weight management Discussed diet and exercise

## 2020-11-24 NOTE — Assessment & Plan Note (Signed)
Well controlled Continue current medications Recheck metabolic panel F/u in 6 months  

## 2020-11-24 NOTE — Assessment & Plan Note (Signed)
Chronic and stable at this point No changes to medications today

## 2020-11-24 NOTE — Assessment & Plan Note (Signed)
Reviewed last lipid panel Not currently on a statin Recheck FLP and CMP Discussed diet and exercise  

## 2020-11-24 NOTE — Assessment & Plan Note (Signed)
Continue to monitor

## 2020-11-24 NOTE — Assessment & Plan Note (Signed)
F/b VVS 

## 2020-12-02 DIAGNOSIS — Z1211 Encounter for screening for malignant neoplasm of colon: Secondary | ICD-10-CM | POA: Diagnosis not present

## 2020-12-11 LAB — COLOGUARD: COLOGUARD: POSITIVE — AB

## 2020-12-12 ENCOUNTER — Ambulatory Visit: Payer: Self-pay | Admitting: *Deleted

## 2020-12-12 NOTE — Telephone Encounter (Signed)
Per intitial encounter, "Patient is experiencing cough and congestion discomfort. Patient first began to experience symptoms on Friday 12/05/20.  Patient shares that the cough has progressively worsened. Patient feels both ear and nasal congestion. Please contact to advise when possible. Mild to moderate; taking Mucinex, green post nasal drip."; recommendations made per nurse triage protocol; she verbalized understanding; the pt is seen at [facility]; there is no availability within the time frame per guidelines; the pt was advised to consider being evaluated at Urgent Care; she verbalized understanding and will go there; will route to office for notification of encounter.  Reason for Disposition . SEVERE coughing spells (e.g., whooping sound after coughing, vomiting after coughing)  Answer Assessment - Initial Assessment Questions 1. ONSET: "When did the cough begin?"      12/05/20 2. SEVERITY: "How bad is the cough today?"     Mild to moderate 3. SPUTUM: "Describe the color of your sputum" (none, dry cough; clear, white, yellow, green)     green 4. HEMOPTYSIS: "Are you coughing up any blood?" If so ask: "How much?" (flecks, streaks, tablespoons, etc.)     no 5. DIFFICULTY BREATHING: "Are you having difficulty breathing?" If Yes, ask: "How bad is it?" (e.g., mild, moderate, severe)    - MILD: No SOB at rest, mild SOB with walking, speaks normally in sentences, can lie down, no retractions, pulse < 100.    - MODERATE: SOB at rest, SOB with minimal exertion and prefers to sit, cannot lie down flat, speaks in phrases, mild retractions, audible wheezing, pulse 100-120.    - SEVERE: Very SOB at rest, speaks in single words, struggling to breathe, sitting hunched forward, retractions, pulse > 120     denies 6. FEVER: "Do you have a fever?" If Yes, ask: "What is your temperature, how was it measured, and when did it start?"  no 7. CARDIAC HISTORY: "Do you have any history of heart  disease?" (e.g., heart attack, congestive heart failure)     Stent 8. LUNG HISTORY: "Do you have any history of lung disease?"  (e.g., pulmonary embolus, asthma, emphysema)   COPD 9. PE RISK FACTORS: "Do you have a history of blood clots?" (or: recent major surgery, recent prolonged travel, bedridden)     no 10. OTHER SYMPTOMS: "Do you have any other symptoms?" (e.g., runny nose, wheezing, chest pain)       Bilateral ears stopped up; ear and nasal congestion 11. PREGNANCY: "Is there any chance you are pregnant?" "When was your last menstrual period?"       *No Answer* 12. TRAVEL: "Have you traveled out of the country in the last month?" (e.g., travel history, exposures)      no  Protocols used: COUGH - ACUTE PRODUCTIVE-A-AH

## 2020-12-13 ENCOUNTER — Other Ambulatory Visit: Payer: Self-pay | Admitting: Family Medicine

## 2020-12-13 NOTE — Telephone Encounter (Signed)
Change of pharmacy Requested Prescriptions  Pending Prescriptions Disp Refills  . ADVAIR DISKUS 250-50 MCG/ACT AEPB [Pharmacy Med Name: ADVAIR DISKUS 250/50MCG (YELLOW) 60] 60 each 0    Sig: INHALE 1 PUFF INTO THE LUNGS TWICE DAILY     Pulmonology:  Combination Products Passed - 12/13/2020  9:34 AM      Passed - Valid encounter within last 12 months    Recent Outpatient Visits          2 weeks ago Essential hypertension   [facility] [place], Marzella Schlein, MD   4 months ago COPD with acute exacerbation [facility])   [facility] [place], Lavella Hammock, PA-C   4 months ago No-show for appointment   [facility] [place], Eula Fried, FNP   8 months ago Essential hypertension   [facility] Osvaldo Angst M, PA-C   10 months ago Chronic obstructive pulmonary disease, unspecified COPD type [facility])   [facility] [name], PA-C      Future Appointments            In 2 months [place], Marzella Schlein, MD [facility], [facility]

## 2020-12-15 ENCOUNTER — Telehealth: Payer: Self-pay | Admitting: Family Medicine

## 2020-12-15 DIAGNOSIS — Z1211 Encounter for screening for malignant neoplasm of colon: Secondary | ICD-10-CM

## 2020-12-15 DIAGNOSIS — R195 Other fecal abnormalities: Secondary | ICD-10-CM

## 2020-12-15 LAB — COLOGUARD: Cologuard: POSITIVE — AB

## 2020-12-15 NOTE — Telephone Encounter (Signed)
LMTCB for cologuard result.  

## 2020-12-15 NOTE — Telephone Encounter (Signed)
FYI

## 2020-12-15 NOTE — Telephone Encounter (Signed)
LMTCB

## 2020-12-15 NOTE — Telephone Encounter (Signed)
Christy Mason with [employer] is calling to report pt has positive cologuard  Case number [id number]. The report was faxed

## 2020-12-18 ENCOUNTER — Other Ambulatory Visit: Payer: Self-pay

## 2020-12-18 DIAGNOSIS — R195 Other fecal abnormalities: Secondary | ICD-10-CM

## 2020-12-18 MED ORDER — SUPREP BOWEL PREP KIT 17.5-3.13-1.6 GM/177ML PO SOLN: 1.0000 | ORAL | 0 refills | Status: DC

## 2020-12-23 ENCOUNTER — Encounter: Payer: Self-pay | Admitting: Family Medicine

## 2020-12-23 DIAGNOSIS — R195 Other fecal abnormalities: Secondary | ICD-10-CM | POA: Insufficient documentation

## 2020-12-23 NOTE — Telephone Encounter (Signed)
lmtcb

## 2020-12-25 NOTE — Telephone Encounter (Signed)
Letter sent for patient to contact our office regarding positive Cologuard and need of referral to gastro.

## 2021-01-08 ENCOUNTER — Ambulatory Visit: Admission: RE | Admit: 2021-01-08 | Payer: Medicaid Other | Source: Home / Self Care | Admitting: Gastroenterology

## 2021-01-08 ENCOUNTER — Encounter: Admission: RE | Payer: Self-pay | Source: Home / Self Care

## 2021-01-08 SURGERY — COLONOSCOPY WITH PROPOFOL
Anesthesia: Choice

## 2021-01-09 ENCOUNTER — Other Ambulatory Visit (INDEPENDENT_AMBULATORY_CARE_PROVIDER_SITE_OTHER): Payer: Self-pay | Admitting: Vascular Surgery

## 2021-01-15 ENCOUNTER — Other Ambulatory Visit: Payer: Self-pay | Admitting: Family Medicine

## 2021-01-15 DIAGNOSIS — J449 Chronic obstructive pulmonary disease, unspecified: Secondary | ICD-10-CM

## 2021-02-24 ENCOUNTER — Ambulatory Visit: Payer: Self-pay | Admitting: Family Medicine

## 2021-06-18 ENCOUNTER — Other Ambulatory Visit: Payer: Self-pay | Admitting: Family Medicine

## 2021-06-18 DIAGNOSIS — J449 Chronic obstructive pulmonary disease, unspecified: Secondary | ICD-10-CM

## 2021-07-31 ENCOUNTER — Other Ambulatory Visit: Payer: Self-pay | Admitting: Family Medicine

## 2021-07-31 NOTE — Telephone Encounter (Signed)
Refill too soon Requested Prescriptions  Pending Prescriptions Disp Refills   ADVAIR DISKUS 250-50 MCG/ACT AEPB [Pharmacy Med Name: ADVAIR 250-50 DISKUS] 180 each 0    Sig: INHALE 1 PUFF INTO THE LUNGS TWICE DAILY. PLEASE SCHEDULE OFFICE VISIT BEFORE ANY FUTURE REFILLS.     Pulmonology:  Combination Products Passed - 07/31/2021  1:03 PM      Passed - Valid encounter within last 12 months    Recent Outpatient Visits          8 months ago Essential hypertension   [facility] [place], Marzella Schlein, MD   1 year ago COPD with acute exacerbation [facility])   [facility] [name], PA-C   1 year ago No-show for appointment   [facility] [place], Eula Fried, FNP   1 year ago Essential hypertension   [facility] Osvaldo Angst M, PA-C   1 year ago Chronic obstructive pulmonary disease, unspecified COPD type [facility])   [facility] [place], Lavella Hammock, [place]

## 2021-09-13 ENCOUNTER — Other Ambulatory Visit: Payer: Self-pay | Admitting: Family Medicine

## 2021-09-13 DIAGNOSIS — J449 Chronic obstructive pulmonary disease, unspecified: Secondary | ICD-10-CM

## 2021-09-14 NOTE — Telephone Encounter (Signed)
Requested medication (s) are due for refill today: yes ? ?Requested medication (s) are on the active medication list: yes ? ?Last refill:  06/18/21 #90 with 0 RF ? ?Future visit scheduled: no ? ?Notes to clinic:  Has already had a curtesy refill and there is no upcoming appointment scheduled. ? ? ? ?  ? ?Requested Prescriptions  ?Pending Prescriptions Disp Refills  ? SPIRIVA HANDIHALER 18 MCG inhalation capsule [Pharmacy Med Name: SPIRIVA HANDIHALER 18 MCG CAP] 90 capsule 0  ?  Sig: INHALE CONTENTS OF 1 CAPSULE ONCE DAILY USING HANDIHALER. Please schedule office visit before any future refills.  ?  ? Pulmonology:  Anticholinergic Agents Passed - 09/13/2021  1:14 AM  ?  ?  Passed - Valid encounter within last 12 months  ?  Recent Outpatient Visits   ? ?      ? 9 months ago Essential hypertension  ? [facility] [place], Marzella Schlein, MD  ? 1 year ago COPD with acute exacerbation [facility])  ? [facility] [place], [place], [place]  ? 1 year ago No-show for appointment  ? [facility] Flinch[place], Eula Fried, FNP  ? 1 year ago Essential hypertension  ? [facility] [place], [place], [place]  ? 1 year ago Chronic obstructive pulmonary disease, unspecified COPD type (HCC)  ? [facility] [place], [place], [place]  ? ?  ?  ? ?  ?  ?  ? ? ?

## 2021-09-14 NOTE — Telephone Encounter (Signed)
Pt needs an appt

## 2021-09-18 ENCOUNTER — Other Ambulatory Visit: Payer: Self-pay | Admitting: Family Medicine

## 2021-09-18 NOTE — Telephone Encounter (Signed)
Requested medication (s) are due for refill today: Yes ? ?Requested medication (s) are on the active medication list: Yes ? ?Last refill:  06/18/21 ? ?Future visit scheduled: No ? ?Notes to clinic:  Left message to call and make appointment. ? ? ? ?Requested Prescriptions  ?Pending Prescriptions Disp Refills  ? ADVAIR DISKUS 250-50 MCG/ACT AEPB [Pharmacy Med Name: ADVAIR 250-50 DISKUS] 180 each 0  ?  Sig: INHALE 1 PUFF INTO THE LUNGS TWICE DAILY. PLEASE SCHEDULE OFFICE VISIT BEFORE ANY FUTURE REFILLS.  ?  ? Pulmonology:  Combination Products Passed - 09/18/2021 10:27 AM  ?  ?  Passed - Valid encounter within last 12 months  ?  Recent Outpatient Visits   ? ?      ? 9 months ago Essential hypertension  ? [facility] [place], Marzella Schlein, MD  ? 1 year ago COPD with acute exacerbation [facility])  ? [facility] [place], [place], [place]  ? 1 year ago No-show for appointment  ? [facility] [place], Eula Fried, FNP  ? 1 year ago Essential hypertension  ? [facility] [place], [place], [place]  ? 1 year ago Chronic obstructive pulmonary disease, unspecified COPD type (HCC)  ? [facility] [place], [place], [place]  ? ?  ?  ? ?  ?  ?  ? ?

## 2021-09-29 ENCOUNTER — Telehealth: Payer: Self-pay | Admitting: *Deleted

## 2021-09-29 NOTE — Patient Outreach (Signed)
Care Coordination ? ?09/29/2021 ? ?[name] ?18-Dec-1960 ?[id number] ? ? ?Medicaid Managed Care  ? ?Unsuccessful Outreach Note ? ?09/29/2021 ?Name: Christy Mason MRN: [id number] DOB: 04/13/1961 ? ?An unsuccessful telephone outreach to offer case management/care coordination services was attempted today.  ? ?Plan: A HIPPA compliant phone message was left for the patient providing contact information and requesting a return call.  ? ?Estanislado Emms RN, BSN ?[place]  [facility] ?RN Care Coordinator ? ? ?

## 2021-10-02 ENCOUNTER — Telehealth: Payer: Self-pay | Admitting: *Deleted

## 2021-10-02 NOTE — Patient Outreach (Signed)
Care Coordination ? ?10/02/2021 ? ?[name] ?03-22-1961 ?[id number] ? ? ?Medicaid Managed Care  ? ?Unsuccessful Outreach Note ? ?10/02/2021 ?Name: Christy Mason MRN: [id number] DOB: August 18, 1960 ? ?A second unsuccessful telephone outreach to offer case management/care coordination services was attempted today.  ? ?Plan: A HIPPA compliant phone message was left for the patient providing contact information and requesting a return call.  ? ?Estanislado Emms RN, BSN ?[place]  [facility] ?RN Care Coordinator ? ? ?

## 2021-10-16 ENCOUNTER — Other Ambulatory Visit: Payer: Self-pay | Admitting: Family Medicine

## 2021-10-16 DIAGNOSIS — J449 Chronic obstructive pulmonary disease, unspecified: Secondary | ICD-10-CM

## 2021-11-22 ENCOUNTER — Other Ambulatory Visit: Payer: Self-pay | Admitting: Family Medicine

## 2021-11-22 DIAGNOSIS — I10 Essential (primary) hypertension: Secondary | ICD-10-CM

## 2021-11-26 ENCOUNTER — Encounter: Payer: Self-pay | Admitting: Physician Assistant

## 2021-11-26 ENCOUNTER — Ambulatory Visit (INDEPENDENT_AMBULATORY_CARE_PROVIDER_SITE_OTHER): Payer: Medicaid Other | Admitting: Physician Assistant

## 2021-11-26 VITALS — BP 109/82 | HR 85 | Temp 98.5°F | Ht 62.5 in | Wt 205.8 lb

## 2021-11-26 DIAGNOSIS — Z1211 Encounter for screening for malignant neoplasm of colon: Secondary | ICD-10-CM

## 2021-11-26 DIAGNOSIS — R195 Other fecal abnormalities: Secondary | ICD-10-CM | POA: Diagnosis not present

## 2021-11-26 DIAGNOSIS — E663 Overweight: Secondary | ICD-10-CM | POA: Diagnosis not present

## 2021-11-26 DIAGNOSIS — S25101S Unspecified injury of right innominate or subclavian artery, sequela: Secondary | ICD-10-CM

## 2021-11-26 DIAGNOSIS — I10 Essential (primary) hypertension: Secondary | ICD-10-CM | POA: Diagnosis not present

## 2021-11-26 DIAGNOSIS — J449 Chronic obstructive pulmonary disease, unspecified: Secondary | ICD-10-CM | POA: Diagnosis not present

## 2021-11-26 DIAGNOSIS — E782 Mixed hyperlipidemia: Secondary | ICD-10-CM

## 2021-11-26 DIAGNOSIS — Z6837 Body mass index (BMI) 37.0-37.9, adult: Secondary | ICD-10-CM | POA: Diagnosis not present

## 2021-11-26 DIAGNOSIS — R42 Dizziness and giddiness: Secondary | ICD-10-CM | POA: Diagnosis not present

## 2021-11-26 MED ORDER — PROAIR HFA 108 (90 BASE) MCG/ACT IN AERS: 2.0000 | INHALATION_SPRAY | Freq: Four times a day (QID) | RESPIRATORY_TRACT | 1 refills | Status: DC | PRN

## 2021-11-26 MED ORDER — SPIRIVA HANDIHALER 18 MCG IN CAPS: 18.0000 ug | ORAL_CAPSULE | Freq: Every day | RESPIRATORY_TRACT | 1 refills | Status: DC

## 2021-11-26 MED ORDER — ATORVASTATIN CALCIUM 20 MG PO TABS: 20.0000 mg | ORAL_TABLET | Freq: Every day | ORAL | 3 refills | Status: DC

## 2021-11-26 MED ORDER — FLUTICASONE-SALMETEROL 250-50 MCG/ACT IN AEPB: 1.0000 | INHALATION_SPRAY | Freq: Two times a day (BID) | RESPIRATORY_TRACT | 0 refills | Status: DC

## 2021-11-26 NOTE — Assessment & Plan Note (Signed)
Takes clopidogrel for this and cardiac stents? Recommend she f/u with cardio and vascular.

## 2021-11-26 NOTE — Assessment & Plan Note (Signed)
Uncontrolled. Refilled inhalers Ref back to pulm.

## 2021-11-26 NOTE — Assessment & Plan Note (Signed)
Will check fasting labs. Managed w/ atorvastatin 20 mg

## 2021-11-26 NOTE — Progress Notes (Signed)
I,[name],acting as a Education administrator for [employer], PA-C.,have documented all relevant documentation on the behalf of Christy Kirschner, PA-C,as directed by  Christy Kirschner, PA-C while in the presence of Christy Kirschner, PA-C.   Established patient visit   Patient: Christy Mason   DOB: 02/17/1961   61 y.o. Female  MRN: [id number] Visit Date: 11/26/2021  Today's healthcare provider: [name], PA-C   Cc. Chronic follow up  Subjective    HPI  Dizziness -when laying on left side, ' room spinning dizziness' Denies any associated SOB or chest heaviness. Reports an exacerbation of her chronic cough and went to urgent care, was rx axithromycin and she feels this is improving.  COPD -Uses proair when she is doing her maintenance inhalers; 1-2 times a week.   Hypertension, follow-up  BP Readings from Last 3 Encounters:  11/26/21 109/82  11/24/20 138/86  04/10/20 115/76   Wt Readings from Last 3 Encounters:  11/26/21 205 lb 12.8 oz (93.4 kg)  11/24/20 198 lb 11.2 oz (90.1 kg)  04/10/20 178 lb 9.6 oz (81 kg)     She was last seen for hypertension 1 years ago.  BP at that visit was 138/86. Management since that visit includes continue current medication.  She reports excellent compliance with treatment. She is not having side effects.  She is following a Regular diet. She is not exercising. She does not smoke.  Use of agents associated with hypertension: none.   Outside blood pressures are  Symptoms: No chest pain No chest pressure  No palpitations Yes syncope  No dyspnea No orthopnea  No paroxysmal nocturnal dyspnea No lower extremity edema   Pertinent labs Lab Results  Component Value Date   CHOL 167 10/08/2019   HDL 37 (L) 10/08/2019   LDLCALC 101 (H) 10/08/2019   TRIG 124 01/03/2020   CHOLHDL 4.5 (H) 10/08/2019   Lab Results  Component Value Date   NA 138 04/10/2020   K 4.2 04/10/2020   CREATININE 0.78 04/10/2020   GFRNONAA 82 04/10/2020   GLUCOSE  92 04/10/2020   TSH 0.286 (L) 01/30/2020     The 10-year ASCVD risk score (Arnett DK, et al., 2019) is: 4.4%  ---------------------------------------------------------------------------------------------------   Medications: Outpatient Medications Prior to Visit  Medication Sig   albuterol (PROVENTIL) (2.5 MG/3ML) 0.083% nebulizer solution USE 1 VIAL VIA NEBULIZER EVERY 4 HOURS AS NEEDED FOR WHEEZING OR SHORTNESS OF BREATH   aspirin EC 81 MG tablet Take 81 mg by mouth daily.   azithromycin (ZITHROMAX) 250 MG tablet TAKE 2 TABLETS BY MOUTH TODAY, THEN TAKE 1 TABLET DAILY FOR 4 DAYS   cetirizine (ZYRTEC) 10 MG tablet Take 10 mg by mouth daily.   cholecalciferol (VITAMIN D3) 25 MCG (1000 UNIT) tablet Take 1,000 Units by mouth daily.   clopidogrel (PLAVIX) 75 MG tablet TAKE 1 TABLET(75 MG) BY MOUTH DAILY   losartan (COZAAR) 50 MG tablet TAKE 1 TABLET(50 MG) BY MOUTH DAILY   Multiple Vitamin (MULTIVITAMIN WITH MINERALS) TABS tablet Take 1 tablet by mouth daily.   ondansetron (ZOFRAN) 4 MG tablet Take 1 tablet (4 mg total) by mouth every 6 (six) hours as needed for nausea.   triamcinolone (NASACORT) 55 MCG/ACT AERO nasal inhaler Place 1 spray into the nose daily as needed (allergies).    [DISCONTINUED] atorvastatin (LIPITOR) 20 MG tablet Take 1 tablet (20 mg total) by mouth daily.   [DISCONTINUED] fluticasone-salmeterol (ADVAIR DISKUS) 250-50 MCG/ACT AEPB INHALE 1 PUFF INTO THE LUNGS TWICE DAILY.  Please schedule office visit before any future refills.   [DISCONTINUED] Na Sulfate-K Sulfate-Mg Sulf (SUPREP BOWEL PREP KIT) 17.5-3.13-1.6 GM/177ML SOLN Take 1 kit by mouth as directed.   [DISCONTINUED] PROAIR HFA 108 (90 Base) MCG/ACT inhaler INHALE 2 PUFFS INTO THE LUNGS EVERY 6 HOURS AS NEEDED FOR WHEEZING OR SHORTNESS OF BREATH (Patient taking differently: Inhale 2 puffs into the lungs every 6 (six) hours as needed for wheezing or shortness of breath.)   [DISCONTINUED] SPIRIVA HANDIHALER 18 MCG  inhalation capsule INHALE CONTENTS OF 1 CAPSULE ONCE DAILY USING HANDIHALER. NEED OFFICE VISIT FOR REFILLS   [DISCONTINUED] SUBOXONE 8-2 MG FILM Take 0.5-1 Film by mouth in the morning and at bedtime.    [DISCONTINUED] diphenhydrAMINE HCl (ZZZQUIL) 50 MG/30ML LIQD Take 50 mg by mouth at bedtime. (Patient not taking: Reported on 11/26/2021)   No facility-administered medications prior to visit.    Review of Systems  Constitutional:  Positive for fatigue. Negative for fever.  Respiratory:  Positive for cough, shortness of breath and wheezing.   Cardiovascular:  Negative for chest pain and leg swelling.  Gastrointestinal:  Negative for abdominal pain.  Neurological:  Positive for dizziness. Negative for headaches.      Objective    Blood pressure 109/82, pulse 85, temperature 98.5 F (36.9 C), temperature source Oral, height 5' 2.5" (1.588 m), weight 205 lb 12.8 oz (93.4 kg), SpO2 100 %.   Physical Exam Constitutional:      General: She is awake.     Appearance: She is well-developed.  HENT:     Head: Normocephalic.  Eyes:     Conjunctiva/sclera: Conjunctivae normal.  Cardiovascular:     Rate and Rhythm: Normal rate and regular rhythm.     Heart sounds: Normal heart sounds.  Pulmonary:     Effort: Pulmonary effort is normal.     Breath sounds: Normal breath sounds.  Skin:    General: Skin is warm.  Neurological:     Mental Status: She is alert and oriented to person, place, and time.  Psychiatric:        Attention and Perception: Attention normal.        Mood and Affect: Mood normal.        Speech: Speech normal.        Behavior: Behavior is cooperative.     No results found for any visits on 11/26/21.  Assessment & Plan     Problem List Items Addressed This Visit       Cardiovascular and Mediastinum   Subclavian artery injury    Takes clopidogrel for this and cardiac stents? Recommend she f/u with cardio and vascular.       Relevant Medications   atorvastatin  (LIPITOR) 20 MG tablet   Other Relevant Orders   Ambulatory referral to Vascular Surgery   Essential hypertension    Controlled today continue meds Will check cmp       Relevant Medications   atorvastatin (LIPITOR) 20 MG tablet   Other Relevant Orders   Comprehensive Metabolic Panel (CMET)   CBC w/Diff/Platelet   Ambulatory referral to Cardiology     Respiratory   COPD (chronic obstructive pulmonary disease) ([place]) - Primary    Uncontrolled. Refilled inhalers Ref back to pulm.        Relevant Medications   azithromycin (ZITHROMAX) 250 MG tablet   fluticasone-salmeterol (ADVAIR DISKUS) 250-50 MCG/ACT AEPB   PROAIR HFA 108 (90 Base) MCG/ACT inhaler   SPIRIVA HANDIHALER 18 MCG inhalation capsule   Other  Relevant Orders   Ambulatory referral to Pulmonology     Other   Mixed hyperlipidemia    Will check fasting labs. Managed w/ atorvastatin 20 mg       Relevant Medications   atorvastatin (LIPITOR) 20 MG tablet   Other Relevant Orders   Lipid Profile   Positive colorectal cancer screening using Cologuard test    Had cancelled colonoscopy. reordered       Vertigo    Improving w/ antibiotic course, may have been related to copd exacerbation Will monitor for recurrence       Other Visit Diagnoses     BMI 37.0-37.9, adult       Relevant Orders   HgB A1c   Colon cancer screening       Relevant Orders   Ambulatory referral to Gastroenterology       Return in about 4 months (around 03/29/2022) for chronic conditions.      I, Christy Kirschner, PA-C have reviewed all documentation for this visit. The documentation on  11/26/2021 for the exam, diagnosis, procedures, and orders are all accurate and complete.  Christy Kirschner, PA-C [facility] [address] [place], [place], [address] Office: [phone] Fax: [place]

## 2021-11-26 NOTE — Assessment & Plan Note (Signed)
Controlled today continue meds Will check cmp

## 2021-11-26 NOTE — Assessment & Plan Note (Signed)
Had cancelled colonoscopy. reordered

## 2021-11-26 NOTE — Assessment & Plan Note (Signed)
Improving w/ antibiotic course, may have been related to copd exacerbation Will monitor for recurrence

## 2021-11-27 LAB — COMPREHENSIVE METABOLIC PANEL
ALT: 14 IU/L (ref 0–32)
AST: 20 IU/L (ref 0–40)
Albumin/Globulin Ratio: 1.3 (ref 1.2–2.2)
Albumin: 4.1 g/dL (ref 3.8–4.8)
Alkaline Phosphatase: 140 IU/L — ABNORMAL HIGH (ref 44–121)
BUN/Creatinine Ratio: 11 — ABNORMAL LOW (ref 12–28)
BUN: 11 mg/dL (ref 8–27)
Bilirubin Total: 0.4 mg/dL (ref 0.0–1.2)
CO2: 23 mmol/L (ref 20–29)
Calcium: 9.8 mg/dL (ref 8.7–10.3)
Chloride: 101 mmol/L (ref 96–106)
Creatinine, Ser: 1.02 mg/dL — ABNORMAL HIGH (ref 0.57–1.00)
Globulin, Total: 3.1 g/dL (ref 1.5–4.5)
Glucose: 98 mg/dL (ref 70–99)
Potassium: 4.9 mmol/L (ref 3.5–5.2)
Sodium: 142 mmol/L (ref 134–144)
Total Protein: 7.2 g/dL (ref 6.0–8.5)
eGFR: 62 mL/min/{1.73_m2} (ref >59)

## 2021-11-27 LAB — CBC WITH DIFFERENTIAL/PLATELET
Basophils Absolute: 0.1 10*3/uL (ref 0.0–0.2)
Basos: 0 %
EOS (ABSOLUTE): 0.3 10*3/uL (ref 0.0–0.4)
Eos: 2 %
Hematocrit: 38.9 % (ref 34.0–46.6)
Hemoglobin: 13.2 g/dL (ref 11.1–15.9)
Immature Grans (Abs): 0 10*3/uL (ref 0.0–0.1)
Immature Granulocytes: 0 %
Lymphocytes Absolute: 3.1 10*3/uL (ref 0.7–3.1)
Lymphs: 25 %
MCH: 29 pg (ref 26.6–33.0)
MCHC: 33.9 g/dL (ref 31.5–35.7)
MCV: 86 fL (ref 79–97)
Monocytes Absolute: 1 10*3/uL — ABNORMAL HIGH (ref 0.1–0.9)
Monocytes: 8 %
Neutrophils Absolute: 8 10*3/uL — ABNORMAL HIGH (ref 1.4–7.0)
Neutrophils: 65 %
Platelets: 254 10*3/uL (ref 150–450)
RBC: 4.55 x10E6/uL (ref 3.77–5.28)
RDW: 15.4 % (ref 11.7–15.4)
WBC: 12.4 10*3/uL — ABNORMAL HIGH (ref 3.4–10.8)

## 2021-11-27 LAB — LIPID PANEL
Chol/HDL Ratio: 2.3 ratio (ref 0.0–4.4)
Cholesterol, Total: 127 mg/dL (ref 100–199)
HDL: 55 mg/dL (ref >39)
LDL Chol Calc (NIH): 58 mg/dL (ref 0–99)
Triglycerides: 65 mg/dL (ref 0–149)
VLDL Cholesterol Cal: 14 mg/dL (ref 5–40)

## 2021-11-27 LAB — HEMOGLOBIN A1C
Est. average glucose Bld gHb Est-mCnc: 137 mg/dL
Hgb A1c MFr Bld: 6.4 % — ABNORMAL HIGH (ref 4.8–5.6)

## 2021-12-15 ENCOUNTER — Other Ambulatory Visit: Payer: Self-pay | Admitting: Family Medicine

## 2021-12-15 DIAGNOSIS — E782 Mixed hyperlipidemia: Secondary | ICD-10-CM

## 2022-01-01 ENCOUNTER — Encounter: Payer: Self-pay | Admitting: Adult Health

## 2022-01-01 ENCOUNTER — Other Ambulatory Visit: Payer: Self-pay | Admitting: Adult Health

## 2022-01-01 ENCOUNTER — Ambulatory Visit (INDEPENDENT_AMBULATORY_CARE_PROVIDER_SITE_OTHER): Payer: Medicaid Other | Admitting: Adult Health

## 2022-01-01 VITALS — BP 132/78 | HR 100 | Temp 98.0°F | Ht 62.5 in | Wt 203.4 lb

## 2022-01-01 DIAGNOSIS — Z87891 Personal history of nicotine dependence: Secondary | ICD-10-CM

## 2022-01-01 DIAGNOSIS — J449 Chronic obstructive pulmonary disease, unspecified: Secondary | ICD-10-CM

## 2022-01-01 DIAGNOSIS — J019 Acute sinusitis, unspecified: Secondary | ICD-10-CM

## 2022-01-01 MED ORDER — ALBUTEROL SULFATE (2.5 MG/3ML) 0.083% IN NEBU: INHALATION_SOLUTION | RESPIRATORY_TRACT | 6 refills | Status: DC

## 2022-01-01 MED ORDER — TRELEGY ELLIPTA 200-62.5-25 MCG/ACT IN AEPB: 1.0000 | INHALATION_SPRAY | Freq: Every day | RESPIRATORY_TRACT | 0 refills | Status: DC

## 2022-01-01 MED ORDER — AMOXICILLIN-POT CLAVULANATE 875-125 MG PO TABS: 1.0000 | ORAL_TABLET | Freq: Two times a day (BID) | ORAL | 0 refills | Status: DC

## 2022-01-01 MED ORDER — PROAIR HFA 108 (90 BASE) MCG/ACT IN AERS: 2.0000 | INHALATION_SPRAY | Freq: Four times a day (QID) | RESPIRATORY_TRACT | 2 refills | Status: DC | PRN

## 2022-01-01 MED ORDER — TRELEGY ELLIPTA 200-62.5-25 MCG/ACT IN AEPB: 1.0000 | INHALATION_SPRAY | Freq: Every day | RESPIRATORY_TRACT | 11 refills | Status: DC

## 2022-01-01 NOTE — Telephone Encounter (Signed)
Changes Requested   Name from pharmacy: Dwyane Luo 200-62.5-25       Will file in chart as: TRELEGY ELLIPTA 200-62.5-25 MCG/ACT AEPB    Possible duplicate: Hover to review recent actions on this medication   Sig: INHALE 1 PUFF BY MOUTH EVERY DAY   Disp:  60 each    Refills:  11   Start: 01/01/2022   Class: Normal   Last ordered: Today (01/01/2022) by Julio Sicks, NP   Last refill: 01/01/2022   Rx #: [id number]   Pharmacy comment: Script Clarification:THIS IS NOT COVERED REQUIRING PRIOR AUTHORIZATION.

## 2022-01-05 ENCOUNTER — Telehealth: Payer: Self-pay

## 2022-01-05 ENCOUNTER — Other Ambulatory Visit (HOSPITAL_COMMUNITY): Payer: Self-pay

## 2022-01-05 DIAGNOSIS — J449 Chronic obstructive pulmonary disease, unspecified: Secondary | ICD-10-CM | POA: Diagnosis not present

## 2022-01-05 NOTE — Telephone Encounter (Signed)
Patient Advocate Encounter   Received notification that prior authorization for Trelegy Ellipta 200-62.5-25MCG/ACT aerosol powder is required.   PA submitted on 01/05/2022 Key [id number]) Status is pending

## 2022-01-11 ENCOUNTER — Other Ambulatory Visit (HOSPITAL_COMMUNITY): Payer: Self-pay

## 2022-01-11 NOTE — Telephone Encounter (Signed)
Patient Advocate Encounter  Received notification from [facility] Care/ CoverMyMeds that the request for prior authorization for Trelegy has been denied due to not meeting plan criteria listed below.     Per your health plan's criteria, this drug is covered if you meet the following: One of the following: (i) You have failed one preferred drug as confirmed by claims history or submission of medical records: [facility], Symbicort. (ii) You cannot use one preferred drug (please specify contraindication or intolerance): Dulera, Symbicort.  Can you provide clinical reason the pt can't use symbicort?

## 2022-01-11 NOTE — Telephone Encounter (Signed)
Insurance will not cover Trelegy.  Please advise patient she is to continue on Advair and Spiriva

## 2022-01-11 NOTE — Telephone Encounter (Signed)
ATC patient to let her know of message from pharmacy team and Tammy, per DPR left detailed message letting her know that Trelegy has been denied and that she is to continue Advair and Spiriva. Advised her to call with any questions or concerns. Nothing further needed at this time.

## 2022-01-14 NOTE — Progress Notes (Deleted)
Cardiology Clinic Note   Patient Name: Christy Mason Date of Encounter: 01/14/2022  Primary Care Provider:  [name], PA-C Primary Cardiologist:  Debbe Odea, MD  Patient Profile    61 year old female with history of anxiety, essential pretension, COPD, PAD/right subclavian stenosis status post stent placement in followed by vascular surgery, former smoker x40+ years, COPD, who presents today in follow-up on her shortness of breath.  Past Medical History    Past Medical History:  Diagnosis Date   Allergy    Anxiety    Arthritis    Asthma    Complication of anesthesia    COPD (chronic obstructive pulmonary disease) (HCC)    Emphysema of lung (HCC)    GERD (gastroesophageal reflux disease)    History of hiatal hernia    Hypertension    Hyperthyroidism    h/o   Neuromuscular disorder (HCC)    PONV (postoperative nausea and vomiting)    Thyroid disease    Past Surgical History:  Procedure Laterality Date   ANGIOPLASTY Right 01/01/2020   Procedure: ANGIOPLASTY ans STENT PLACEMENT RIGHT SUBCLAVIAN AND AXILLARY ARTERY;  Surgeon: Renford Dills, MD;  Location: [facility];  Service: Vascular;  Laterality: Right;   CARPAL TUNNEL RELEASE Right    CHOLECYSTECTOMY     FRACTURE SURGERY Right    plates   FUNCTIONAL ENDOSCOPIC SINUS SURGERY     SALPINGOOPHORECTOMY Right    THROMBECTOMY BRACHIAL ARTERY Right 01/01/2020   Procedure: THROMBECTOMY BRACHIAL AXILLARY ARTERY;  Surgeon: Renford Dills, MD;  Location: [facility];  Service: Vascular;  Laterality: Right;   TUBAL LIGATION     UPPER EXTREMITY ANGIOGRAPHY Right 01/23/2019   Procedure: UPPER EXTREMITY ANGIOGRAPHY;  Surgeon: Renford Dills, MD;  Location: [facility];  Service: Cardiovascular;  Laterality: Right;   UPPER EXTREMITY ANGIOGRAPHY Right 02/07/2019   Procedure: UPPER EXTREMITY ANGIOGRAPHY;  Surgeon: Renford Dills, MD;  Location: [facility];  Service: Cardiovascular;   Laterality: Right;   UPPER EXTREMITY ANGIOGRAPHY Right 01/01/2020   Procedure: UPPER EXTREMITY ANGIOGRAPHY;  Surgeon: Renford Dills, MD;  Location: [facility];  Service: Cardiovascular;  Laterality: Right;    Allergies  Allergies  Allergen Reactions   Acetaminophen     Avoid due to fatty liver    Nsaids Other (See Comments)    irritates stomach severley    Other     Steroids-muscle weakness and headache   Sular [Nisoldipine Er] Palpitations    History of Present Illness    61 year old female with previously mentioned complex medical history.  Anxiety, essential hypertension, COPD, PDA/R subclavian stenosis status post stent placement being followed by vascular surgery, former smoker x40+ years, and shortness of breath.  She was hospitalized 01/29/2020 for respiratory distress, diagnosed with COPD exacerbation.  At that time she has stated she had stopped smoking approximately 3 weeks prior.  Symptoms of shortness of breath slowly began improving since discharge.  She denied any chest pain at rest or with ambulation.  She was subsequently discharged home and edema with inhalers.  She is continue to follow with vascular for subclavian stenosis as well as her PCP.  Last time she was seen in clinic was 02/12/2020 for hospital follow-up with reports of dyspnea on exertion and weakness.  At the time of report her breathing had improved and her blood pressure was low normal and she was advised to stop her HCTZ and decrease her losartan to 25 mg daily. She was  to continue with taking ASA, plavix and to start lipitor 20 mg daily with fasting lipid panel in three moths. She is back in clinic today Home Medications    Current Outpatient Medications  Medication Sig Dispense Refill   albuterol (PROVENTIL) (2.5 MG/3ML) 0.083% nebulizer solution USE 1 VIAL VIA NEBULIZER EVERY 4 HOURS AS NEEDED FOR WHEEZING OR SHORTNESS OF BREATH 225 mL 6   amoxicillin-clavulanate (AUGMENTIN) 875-125 MG tablet  Take 1 tablet by mouth 2 (two) times daily. 14 tablet 0   aspirin EC 81 MG tablet Take 81 mg by mouth daily.     atorvastatin (LIPITOR) 20 MG tablet TAKE 1 TABLET BY MOUTH EVERY DAY 90 tablet 3   cetirizine (ZYRTEC) 10 MG tablet Take 10 mg by mouth daily.     cholecalciferol (VITAMIN D3) 25 MCG (1000 UNIT) tablet Take 1,000 Units by mouth daily.     clopidogrel (PLAVIX) 75 MG tablet TAKE 1 TABLET(75 MG) BY MOUTH DAILY 90 tablet 3   Fluticasone-Umeclidin-Vilant (TRELEGY ELLIPTA) 200-62.5-25 MCG/ACT AEPB Inhale 1 puff into the lungs daily. 14 each 0   Fluticasone-Umeclidin-Vilant (TRELEGY ELLIPTA) 200-62.5-25 MCG/ACT AEPB Inhale 1 puff into the lungs daily. 60 each 11   losartan (COZAAR) 50 MG tablet TAKE 1 TABLET(50 MG) BY MOUTH DAILY 90 tablet 3   Multiple Vitamin (MULTIVITAMIN WITH MINERALS) TABS tablet Take 1 tablet by mouth daily.     ondansetron (ZOFRAN) 4 MG tablet Take 1 tablet (4 mg total) by mouth every 6 (six) hours as needed for nausea. 20 tablet 0   PROAIR HFA 108 (90 Base) MCG/ACT inhaler Inhale 2 puffs into the lungs every 6 (six) hours as needed for wheezing or shortness of breath. 18 g 2   SUBOXONE 8-2 MG FILM Take by mouth 2 (two) times daily.     triamcinolone (NASACORT) 55 MCG/ACT AERO nasal inhaler Place 1 spray into the nose daily as needed (allergies).  (Patient not taking: Reported on 01/01/2022)     No current facility-administered medications for this visit.     Family History    Family History  Problem Relation Age of Onset   Depression Mother    Hypertension Mother    COPD Mother    Diabetes Father    Hypertension Father    She indicated that her mother is deceased. She indicated that her father is alive. She indicated that her sister is alive.  Social History    Social History   Socioeconomic History   Marital status: Divorced    Spouse name: Not on file   Number of children: 4   Years of education: Not on file   Highest education level: Not on file   Occupational History   Occupation: [employer]  Tobacco Use   Smoking status: Former    Packs/day: 1.00    Years: 30.00    Total pack years: 30.00    Types: Cigarettes    Quit date: 2021    Years since quitting: 2.5   Smokeless tobacco: Never  Vaping Use   Vaping Use: Former  Substance and Sexual Activity   Alcohol use: Not Currently   Drug use: Not Currently   Sexual activity: Not Currently  Other Topics Concern   Not on file  Social History Narrative   Not on file   Social Determinants of Health   Financial Resource Strain: Not on file  Food Insecurity: No Food Insecurity (02/07/2019)   Hunger Vital Sign    Worried About Running Out of Food in  the Last Year: Never true    Ran Out of Food in the Last Year: Never true  Transportation Needs: No Transportation Needs (02/07/2019)   PRAPARE - Administrator, Civil Service (Medical): No    Lack of Transportation (Non-Medical): No  Physical Activity: Unknown (02/07/2019)   Exercise Vital Sign    Days of Exercise per Week: 0 days    Minutes of Exercise per Session: Not on file  Stress: No Stress Concern Present (02/07/2019)   [employer] of Occupational Health - Occupational Stress Questionnaire    Feeling of Stress : Only a little  Social Connections: Unknown (02/07/2019)   Social Connection and Isolation Panel [NHANES]    Frequency of Communication with Friends and Family: More than three times a week    Frequency of Social Gatherings with Friends and Family: Not on file    Attends Religious Services: Not on file    Active Member of Clubs or Organizations: Not on file    Attends Banker Meetings: Not on file    Marital Status: Not on file  Intimate Partner Violence: Not At Risk (02/07/2019)   Humiliation, Afraid, Rape, and Kick questionnaire    Fear of Current or Ex-Partner: No    Emotionally Abused: No    Physically Abused: No    Sexually Abused: No     Review of Systems    General:  No chills,  fever, night sweats or weight changes.  Cardiovascular:  No chest pain, dyspnea on exertion, edema, orthopnea, palpitations, paroxysmal nocturnal dyspnea. Dermatological: No rash, lesions/masses Respiratory: No cough, dyspnea Urologic: No hematuria, dysuria Abdominal:   No nausea, vomiting, diarrhea, bright red blood per rectum, melena, or hematemesis Neurologic:  No visual changes, wkns, changes in mental status. All other systems reviewed and are otherwise negative except as noted above.     Physical Exam    VS:  There were no vitals taken for this visit. , BMI There is no height or weight on file to calculate BMI.     GEN: Well nourished, well developed, in no acute distress. HEENT: normal. Neck: Supple, no JVD, carotid bruits, or masses. Cardiac: RRR, no murmurs, rubs, or gallops. No clubbing, cyanosis, edema.  Radials/DP/PT 2+ and equal bilaterally.  Respiratory:  Respirations regular and unlabored, clear to auscultation bilaterally. GI: Soft, nontender, nondistended, BS + x 4. MS: no deformity or atrophy. Skin: warm and dry, no rash. Neuro:  Strength and sensation are intact. Psych: Normal affect.  Accessory Clinical Findings    ECG personally reviewed by me today- *** - No acute changes  Lab Results  Component Value Date   WBC 12.4 (H) 11/26/2021   HGB 13.2 11/26/2021   HCT 38.9 11/26/2021   MCV 86 11/26/2021   PLT 254 11/26/2021   Lab Results  Component Value Date   CREATININE 1.02 (H) 11/26/2021   BUN 11 11/26/2021   NA 142 11/26/2021   K 4.9 11/26/2021   CL 101 11/26/2021   CO2 23 11/26/2021   Lab Results  Component Value Date   ALT 14 11/26/2021   AST 20 11/26/2021   ALKPHOS 140 (H) 11/26/2021   BILITOT 0.4 11/26/2021   Lab Results  Component Value Date   CHOL 127 11/26/2021   HDL 55 11/26/2021   LDLCALC 58 11/26/2021   TRIG 65 11/26/2021   CHOLHDL 2.3 11/26/2021    Lab Results  Component Value Date   HGBA1C 6.4 (H) 11/26/2021     Assessment &  Plan   1.  ***  Latera Mclin, NP 01/14/2022, 8:42 PM

## 2022-01-15 ENCOUNTER — Ambulatory Visit: Payer: Medicaid Other | Admitting: Physician Assistant

## 2022-01-22 ENCOUNTER — Other Ambulatory Visit (HOSPITAL_COMMUNITY): Payer: Self-pay

## 2022-02-04 DIAGNOSIS — J449 Chronic obstructive pulmonary disease, unspecified: Secondary | ICD-10-CM | POA: Diagnosis not present

## 2022-02-11 ENCOUNTER — Other Ambulatory Visit (INDEPENDENT_AMBULATORY_CARE_PROVIDER_SITE_OTHER): Payer: Self-pay | Admitting: Nurse Practitioner

## 2022-02-11 ENCOUNTER — Other Ambulatory Visit: Payer: Self-pay | Admitting: Family Medicine

## 2022-02-11 DIAGNOSIS — I10 Essential (primary) hypertension: Secondary | ICD-10-CM

## 2022-02-11 DIAGNOSIS — J449 Chronic obstructive pulmonary disease, unspecified: Secondary | ICD-10-CM

## 2022-02-12 NOTE — Telephone Encounter (Signed)
Requested Prescriptions  Pending Prescriptions Disp Refills  . SPIRIVA HANDIHALER 18 MCG inhalation capsule [Pharmacy Med Name: SPIRIVA HANDIHALER 18 MCG CAP] 30 capsule 0    Sig: INHALE CONTENTS OF 1 CAPSULE ONCE DAILY USING HANDIHALER. NEED OFFICE VISIT FOR REFILLS     Pulmonology:  Anticholinergic Agents Passed - 02/11/2022  1:52 PM      Passed - Valid encounter within last 12 months    Recent Outpatient Visits          2 months ago Chronic obstructive pulmonary disease, unspecified COPD type (HCC)   [facility] [name], PA-C   1 year ago Essential hypertension   [facility] [place], Marzella Schlein, MD   1 year ago COPD with acute exacerbation [facility])   [facility] [name], PA-C   1 year ago No-show for appointment   [facility] Flinchum, Eula Fried, FNP   1 year ago Essential hypertension   [facility] Osvaldo Angst M, [place]      Future Appointments            In 1 month Drubel, Lillia Abed, PA-C [facility]           . losartan (COZAAR) 50 MG tablet [Pharmacy Med Name: LOSARTAN POTASSIUM 50 MG TAB] 90 tablet 0    Sig: TAKE 1 TABLET BY MOUTH EVERY DAY     Cardiovascular:  Angiotensin Receptor Blockers Failed - 02/11/2022  1:52 PM      Failed - Cr in normal range and within 180 days    Creatinine  Date Value Ref Range Status  03/19/2014 0.86 0.60 - 1.30 mg/dL Final   Creatinine, Ser  Date Value Ref Range Status  11/26/2021 1.02 (H) 0.57 - 1.00 mg/dL Final         Passed - K in normal range and within 180 days    Potassium  Date Value Ref Range Status  11/26/2021 4.9 3.5 - 5.2 mmol/L Final  03/19/2014 3.0 (L) 3.5 - 5.1 mmol/L Final         Passed - Patient is not pregnant      Passed - Last BP in normal range    BP Readings from Last 1 Encounters:  01/01/22 132/78         Passed - Valid encounter within last 6 months    Recent Outpatient Visits           2 months ago Chronic obstructive pulmonary disease, unspecified COPD type (HCC)   [facility] [name], PA-C   1 year ago Essential hypertension   [facility], Marzella Schlein, MD   1 year ago COPD with acute exacerbation [facility])   [facility] [name], PA-C   1 year ago No-show for appointment   [facility] Flinchum, Eula Fried, FNP   1 year ago Essential hypertension   [facility] [name], [place]      Future Appointments            In 1 month Ok Edwards, Lillia Abed, PA-C [facility]

## 2022-02-12 NOTE — Telephone Encounter (Signed)
Requested medication (s) are due for refill today: no  Requested medication (s) are on the active medication list:no  Last refill:  11/26/21,discontinued  Future visit scheduled:YES  Notes to clinic:  Unable to refill per protocol, Rx expired.Medication was discontinued 11/26/21 by PCP.     Requested Prescriptions  Pending Prescriptions Disp Refills   SPIRIVA HANDIHALER 18 MCG inhalation capsule [Pharmacy Med Name: SPIRIVA HANDIHALER 18 MCG CAP] 30 capsule 0    Sig: INHALE CONTENTS OF 1 CAPSULE ONCE DAILY USING HANDIHALER. NEED OFFICE VISIT FOR REFILLS     Pulmonology:  Anticholinergic Agents Passed - 02/11/2022  1:52 PM      Passed - Valid encounter within last 12 months    Recent Outpatient Visits           2 months ago Chronic obstructive pulmonary disease, unspecified COPD type (HCC)   [facility] [name], PA-C   1 year ago Essential hypertension   [facility], Marzella Schlein, MD   1 year ago COPD with acute exacerbation [facility])   [facility] [name], PA-C   1 year ago No-show for appointment   [facility] Flinchum, Eula Fried, FNP   1 year ago Essential hypertension   [facility] Osvaldo Angst M, [place]       Future Appointments             In 1 month Drubel, Lillia Abed, PA-C [facility]            Signed Prescriptions Disp Refills   losartan (COZAAR) 50 MG tablet 90 tablet 0    Sig: TAKE 1 TABLET BY MOUTH EVERY DAY     Cardiovascular:  Angiotensin Receptor Blockers Failed - 02/11/2022  1:52 PM      Failed - Cr in normal range and within 180 days    Creatinine  Date Value Ref Range Status  03/19/2014 0.86 0.60 - 1.30 mg/dL Final   Creatinine, Ser  Date Value Ref Range Status  11/26/2021 1.02 (H) 0.57 - 1.00 mg/dL Final         Passed - K in normal range and within 180 days    Potassium  Date Value Ref Range Status  11/26/2021 4.9 3.5 - 5.2  mmol/L Final  03/19/2014 3.0 (L) 3.5 - 5.1 mmol/L Final         Passed - Patient is not pregnant      Passed - Last BP in normal range    BP Readings from Last 1 Encounters:  01/01/22 132/78         Passed - Valid encounter within last 6 months    Recent Outpatient Visits           2 months ago Chronic obstructive pulmonary disease, unspecified COPD type (HCC)   [facility] [name], PA-C   1 year ago Essential hypertension   [facility] [place], Marzella Schlein, MD   1 year ago COPD with acute exacerbation [facility])   [facility] [name], PA-C   1 year ago No-show for appointment   [facility] Flinchum, Eula Fried, FNP   1 year ago Essential hypertension   [facility] [name], [place]       Future Appointments             In 1 month Ok Edwards, Lillia Abed, PA-C [facility]

## 2022-02-17 ENCOUNTER — Other Ambulatory Visit (INDEPENDENT_AMBULATORY_CARE_PROVIDER_SITE_OTHER): Payer: Self-pay | Admitting: Nurse Practitioner

## 2022-02-17 DIAGNOSIS — Z9889 Other specified postprocedural states: Secondary | ICD-10-CM

## 2022-02-17 DIAGNOSIS — I771 Stricture of artery: Secondary | ICD-10-CM

## 2022-02-18 ENCOUNTER — Encounter (INDEPENDENT_AMBULATORY_CARE_PROVIDER_SITE_OTHER): Payer: Self-pay | Admitting: Nurse Practitioner

## 2022-02-18 ENCOUNTER — Ambulatory Visit (INDEPENDENT_AMBULATORY_CARE_PROVIDER_SITE_OTHER): Payer: Medicaid Other

## 2022-02-18 ENCOUNTER — Ambulatory Visit (INDEPENDENT_AMBULATORY_CARE_PROVIDER_SITE_OTHER): Payer: Medicaid Other | Admitting: Nurse Practitioner

## 2022-02-18 VITALS — BP 138/80 | HR 74 | Resp 16 | Wt 202.0 lb

## 2022-02-18 DIAGNOSIS — I739 Peripheral vascular disease, unspecified: Secondary | ICD-10-CM

## 2022-02-18 DIAGNOSIS — I771 Stricture of artery: Secondary | ICD-10-CM

## 2022-02-18 DIAGNOSIS — Z9889 Other specified postprocedural states: Secondary | ICD-10-CM

## 2022-02-18 DIAGNOSIS — E782 Mixed hyperlipidemia: Secondary | ICD-10-CM

## 2022-02-18 DIAGNOSIS — I10 Essential (primary) hypertension: Secondary | ICD-10-CM

## 2022-02-20 ENCOUNTER — Encounter (INDEPENDENT_AMBULATORY_CARE_PROVIDER_SITE_OTHER): Payer: Self-pay | Admitting: Nurse Practitioner

## 2022-02-20 NOTE — Progress Notes (Signed)
Subjective:    Patient ID: Christy Mason, female    DOB: 08/13/1960, 61 y.o.   MRN: [id number] Chief Complaint  Patient presents with   Follow-up    Ref Drubel consult ue art duplex,subclavian artery    Christy Mason is a 61 year old female who presents today as a return for evaluation due to her right subclavian stent placement on 01/23/2019.  The patient was last seen in 2021 and since that time she denies any significant problems or issues with her upper extremity.  She denies any pain.  She denies any numbness and tingling.  She has no dizziness noted.  Overall she is doing well.  Today noninvasive studies show biphasic/triphasic waveforms throughout the right upper extremity no notable significant obstruction.  Patent subclavian stent.    Review of Systems  Neurological:  Negative for dizziness.  All other systems reviewed and are negative.      Objective:   Physical Exam Vitals reviewed.  HENT:     Head: Normocephalic.  Cardiovascular:     Rate and Rhythm: Normal rate.     Pulses: Normal pulses.  Pulmonary:     Effort: Pulmonary effort is normal.  Skin:    General: Skin is warm and dry.     Capillary Refill: Capillary refill takes less than 2 seconds.  Neurological:     Mental Status: She is alert and oriented to person, place, and time.  Psychiatric:        Mood and Affect: Mood normal.        Behavior: Behavior normal.        Thought Content: Thought content normal.        Judgment: Judgment normal.     BP 138/80 (BP Location: Left Arm)   Pulse 74   Resp 16   Wt 202 lb (91.6 kg)   BMI 36.36 kg/m   Past Medical History:  Diagnosis Date   Allergy    Anxiety    Arthritis    Asthma    Complication of anesthesia    COPD (chronic obstructive pulmonary disease) (HCC)    Emphysema of lung (HCC)    GERD (gastroesophageal reflux disease)    History of hiatal hernia    Hypertension    Hyperthyroidism    h/o   Neuromuscular disorder (HCC)    PONV  (postoperative nausea and vomiting)    Thyroid disease     Social History   Socioeconomic History   Marital status: Divorced    Spouse name: Not on file   Number of children: 4   Years of education: Not on file   Highest education level: Not on file  Occupational History   Occupation: SSI  Tobacco Use   Smoking status: Former    Packs/day: 1.00    Years: 30.00    Total pack years: 30.00    Types: Cigarettes    Quit date: 2021    Years since quitting: 2.6   Smokeless tobacco: Never  Vaping Use   Vaping Use: Former  Substance and Sexual Activity   Alcohol use: Not Currently   Drug use: Not Currently   Sexual activity: Not Currently  Other Topics Concern   Not on file  Social History Narrative   Not on file   Social Determinants of Health   Financial Resource Strain: Not on file  Food Insecurity: No Food Insecurity (02/07/2019)   Hunger Vital Sign    Worried About Running Out of Food in the Last Year: Never  true    Ran Out of Food in the Last Year: Never true  Transportation Needs: No Transportation Needs (02/07/2019)   PRAPARE - Administrator, Civil Service (Medical): No    Lack of Transportation (Non-Medical): No  Physical Activity: Unknown (02/07/2019)   Exercise Vital Sign    Days of Exercise per Week: 0 days    Minutes of Exercise per Session: Not on file  Stress: No Stress Concern Present (02/07/2019)   [employer] of Occupational Health - Occupational Stress Questionnaire    Feeling of Stress : Only a little  Social Connections: Unknown (02/07/2019)   Social Connection and Isolation Panel [NHANES]    Frequency of Communication with Friends and Family: More than three times a week    Frequency of Social Gatherings with Friends and Family: Not on file    Attends Religious Services: Not on file    Active Member of Clubs or Organizations: Not on file    Attends Banker Meetings: Not on file    Marital Status: Not on file  Intimate  Partner Violence: Not At Risk (02/07/2019)   Humiliation, Afraid, Rape, and Kick questionnaire    Fear of Current or Ex-Partner: No    Emotionally Abused: No    Physically Abused: No    Sexually Abused: No    Past Surgical History:  Procedure Laterality Date   ANGIOPLASTY Right 01/01/2020   Procedure: ANGIOPLASTY ans STENT PLACEMENT RIGHT SUBCLAVIAN AND AXILLARY ARTERY;  Surgeon: Renford Dills, MD;  Location: [facility] ORS;  Service: Vascular;  Laterality: Right;   CARPAL TUNNEL RELEASE Right    CHOLECYSTECTOMY     FRACTURE SURGERY Right    plates   FUNCTIONAL ENDOSCOPIC SINUS SURGERY     SALPINGOOPHORECTOMY Right    THROMBECTOMY BRACHIAL ARTERY Right 01/01/2020   Procedure: THROMBECTOMY BRACHIAL AXILLARY ARTERY;  Surgeon: Renford Dills, MD;  Location: [facility] OR[facility];  Service: Vascular;  Laterality: Right;   TUBAL LIGATION     UPPER EXTREMITY ANGIOGRAPHY Right 01/23/2019   Procedure: UPPER EXTREMITY ANGIOGRAPHY;  Surgeon: Renford Dills, MD;  Location: [facility];  Service: Cardiovascular;  Laterality: Right;   UPPER EXTREMITY ANGIOGRAPHY Right 02/07/2019   Procedure: UPPER EXTREMITY ANGIOGRAPHY;  Surgeon: Renford Dills, MD;  Location: [facility];  Service: Cardiovascular;  Laterality: Right;   UPPER EXTREMITY ANGIOGRAPHY Right 01/01/2020   Procedure: UPPER EXTREMITY ANGIOGRAPHY;  Surgeon: Renford Dills, MD;  Location: [facility];  Service: Cardiovascular;  Laterality: Right;    Family History  Problem Relation Age of Onset   Depression Mother    Hypertension Mother    COPD Mother    Diabetes Father    Hypertension Father     Allergies  Allergen Reactions   Acetaminophen     Avoid due to fatty liver    Nsaids Other (See Comments)    irritates stomach severley    Other     Steroids-muscle weakness and headache   Sular [Nisoldipine Er] Palpitations       Latest Ref Rng & Units 11/26/2021    2:23 PM 04/10/2020    3:18 PM 02/15/2020     2:31 PM  CBC  WBC 3.4 - 10.8 x10E3/uL 12.4  8.6  7.5   Hemoglobin 11.1 - 15.9 g/dL 71.2  45.8  09.9   Hematocrit 34.0 - 46.6 % 38.9  37.1  32.6   Platelets 150 - 450 x10E3/uL 254  239  235  CMP     Component Value Date/Time   NA 142 11/26/2021 1423   NA 136 03/19/2014 1818   K 4.9 11/26/2021 1423   K 3.0 (L) 03/19/2014 1818   CL 101 11/26/2021 1423   CL 101 03/19/2014 1818   CO2 23 11/26/2021 1423   CO2 24 03/19/2014 1818   GLUCOSE 98 11/26/2021 1423   GLUCOSE 130 (H) 02/08/2020 0434   GLUCOSE 139 (H) 03/19/2014 1818   BUN 11 11/26/2021 1423   BUN 10 03/19/2014 1818   CREATININE 1.02 (H) 11/26/2021 1423   CREATININE 0.86 03/19/2014 1818   CALCIUM 9.8 11/26/2021 1423   CALCIUM 10.4 (H) 03/19/2014 1818   PROT 7.2 11/26/2021 1423   PROT 9.3 (H) 03/19/2014 1818   ALBUMIN 4.1 11/26/2021 1423   ALBUMIN 4.0 03/19/2014 1818   AST 20 11/26/2021 1423   AST 24 03/19/2014 1818   ALT 14 11/26/2021 1423   ALT 16 03/19/2014 1818   ALKPHOS 140 (H) 11/26/2021 1423   ALKPHOS 104 03/19/2014 1818   BILITOT 0.4 11/26/2021 1423   BILITOT 0.6 03/19/2014 1818   GFRNONAA 82 04/10/2020 1518   GFRNONAA >60 03/19/2014 1818   GFRAA 95 04/10/2020 1518   GFRAA >60 03/19/2014 1818     No results found.     Assessment & Plan:   1. Subclavian arterial stenosis (HCC) Today the patient's stent is patent.  She denies any significant problems or symptoms.  The patient request to stop Plavix which is reasonable at this time since her stent has been patent without any significant issues.  She will continue with a daily aspirin.  We will have patient return in 1 year or sooner if issues arise. - VAS [place] UPPER EXTREMITY ARTERIAL DUPLEX  2. Mixed hyperlipidemia Continue statin as ordered and reviewed, no changes at this time   3. Essential hypertension Continue antihypertensive medications as already ordered, these medications have been reviewed and there are no changes at this time.     Current Outpatient Medications on File Prior to Visit  Medication Sig Dispense Refill   albuterol (PROVENTIL) (2.5 MG/3ML) 0.083% nebulizer solution USE 1 VIAL VIA NEBULIZER EVERY 4 HOURS AS NEEDED FOR WHEEZING OR SHORTNESS OF BREATH 225 mL 6   aspirin EC 81 MG tablet Take 81 mg by mouth daily.     atorvastatin (LIPITOR) 20 MG tablet TAKE 1 TABLET BY MOUTH EVERY DAY 90 tablet 3   cetirizine (ZYRTEC) 10 MG tablet Take 10 mg by mouth daily.     cholecalciferol (VITAMIN D3) 25 MCG (1000 UNIT) tablet Take 1,000 Units by mouth daily.     clopidogrel (PLAVIX) 75 MG tablet TAKE 1 TABLET BY MOUTH EVERY DAY 90 tablet 3   fluticasone-salmeterol (ADVAIR) 250-50 MCG/ACT AEPB Inhale 1 puff into the lungs in the morning and at bedtime.     Fluticasone-Umeclidin-Vilant (TRELEGY ELLIPTA) 200-62.5-25 MCG/ACT AEPB Inhale 1 puff into the lungs daily. 14 each 0   losartan (COZAAR) 50 MG tablet TAKE 1 TABLET BY MOUTH EVERY DAY 90 tablet 0   Multiple Vitamin (MULTIVITAMIN WITH MINERALS) TABS tablet Take 1 tablet by mouth daily.     ondansetron (ZOFRAN) 4 MG tablet Take 1 tablet (4 mg total) by mouth every 6 (six) hours as needed for nausea. 20 tablet 0   PROAIR HFA 108 (90 Base) MCG/ACT inhaler Inhale 2 puffs into the lungs every 6 (six) hours as needed for wheezing or shortness of breath. 18 g 2   SUBOXONE 8-2 MG  FILM Take by mouth 2 (two) times daily.     tiotropium (SPIRIVA HANDIHALER) 18 MCG inhalation capsule SPC     amoxicillin-clavulanate (AUGMENTIN) 875-125 MG tablet Take 1 tablet by mouth 2 (two) times daily. (Patient not taking: Reported on 02/18/2022) 14 tablet 0   Fluticasone-Umeclidin-Vilant (TRELEGY ELLIPTA) 200-62.5-25 MCG/ACT AEPB Inhale 1 puff into the lungs daily. (Patient not taking: Reported on 02/18/2022) 60 each 11   triamcinolone (NASACORT) 55 MCG/ACT AERO nasal inhaler Place 1 spray into the nose daily as needed (allergies).  (Patient not taking: Reported on 01/01/2022)     No current  facility-administered medications on file prior to visit.    There are no Patient Instructions on file for this visit. No follow-ups on file.   Georgiana Spinner, NP

## 2022-02-21 ENCOUNTER — Other Ambulatory Visit: Payer: Self-pay | Admitting: Physician Assistant

## 2022-03-02 ENCOUNTER — Telehealth: Payer: Self-pay | Admitting: Physician Assistant

## 2022-03-02 NOTE — Telephone Encounter (Signed)
..   Medicaid Managed Care   Unsuccessful Outreach Note  03/02/2022 Name: Christy Mason MRN: [id number] DOB: [birth year]  Referred by: [name], PA-C Reason for referral : High Risk Managed Medicaid (I called the patient today to get her scheduled with the MM Team. I left my name and number on her VM. This was the third attempt to reach her.)   Third unsuccessful telephone outreach was attempted today. The patient was referred to the case management team for assistance with care management and care coordination. The patient's primary care provider has been notified of our unsuccessful attempts to make or maintain contact with the patient. The care management team is pleased to engage with this patient at any time in the future should he/she be interested in assistance from the care management team.   Follow Up Plan: We have been unable to make contact with the patient for follow up. The care management team is available to follow up with the patient after provider conversation with the patient regarding recommendation for care management engagement and subsequent re-referral to the care management team.     Weston Settle Care Guide, High Risk Medicaid Managed Care Embedded Care Coordination [facility]  [facility]    SIGNATURE

## 2022-03-07 DIAGNOSIS — J449 Chronic obstructive pulmonary disease, unspecified: Secondary | ICD-10-CM | POA: Diagnosis not present

## 2022-03-11 ENCOUNTER — Other Ambulatory Visit: Payer: Self-pay | Admitting: Family Medicine

## 2022-03-18 ENCOUNTER — Other Ambulatory Visit: Payer: Self-pay | Admitting: Physician Assistant

## 2022-03-18 ENCOUNTER — Other Ambulatory Visit: Payer: Self-pay | Admitting: Family Medicine

## 2022-03-18 DIAGNOSIS — I10 Essential (primary) hypertension: Secondary | ICD-10-CM

## 2022-03-23 ENCOUNTER — Other Ambulatory Visit: Payer: Self-pay | Admitting: Physician Assistant

## 2022-03-23 NOTE — Telephone Encounter (Signed)
Medication Refill - Medication: fluticasone-salmeterol (ADVAIR) 250-50 MCG/ACT AEPB/SPIRIVA HANDIHALER 18 MCG inhalation capsule 250/50 She states tried to contact heart Dr about spiriva but they told her they couldn't fill it. Patient is out of these meds  Has the patient contacted their pharmacy? yes (Agent: If no, request that the patient contact the pharmacy for the refill. If patient does not wish to contact the pharmacy document the reason why and proceed with request.) (Agent: If yes, when and what did the pharmacy advise?)contact pcp  Preferred Pharmacy (with phone number or street name): [phone], [place] - [address] [place] [address] Phone: [phone] Fax: [phone] Has the patient been seen for an appointment in the last year OR does the patient have an upcoming appointment? yes  Agent: Please be advised that RX refills may take up to 3 business days. We ask that you follow-up with your pharmacy.

## 2022-03-24 MED ORDER — SPIRIVA HANDIHALER 18 MCG IN CAPS
ORAL_CAPSULE | RESPIRATORY_TRACT | 0 refills | Status: DC
Start: 2022-03-24 — End: 2022-08-03

## 2022-03-24 MED ORDER — FLUTICASONE-SALMETEROL 250-50 MCG/ACT IN AEPB: 1.0000 | INHALATION_SPRAY | Freq: Two times a day (BID) | RESPIRATORY_TRACT | 0 refills | Status: DC

## 2022-03-24 NOTE — Telephone Encounter (Signed)
Requested medication (s) are due for refill today: historical medication   Requested medication (s) are on the active medication list: yes   Last refill:  advair - historical medication, spiriva-03/11/22 #30 0 refills   Future visit scheduled: yes in 6 days  Notes to clinic:  historical medication , do you want to order advair? Do you want to refill spiriva? Patient reports medicaid will not cover trelegy. Patient is completely out of medications. Please advise      Requested Prescriptions  Pending Prescriptions Disp Refills   SPIRIVA HANDIHALER 18 MCG inhalation capsule 30 capsule 0     Pulmonology:  Anticholinergic Agents Passed - 03/24/2022  3:09 PM      Passed - Valid encounter within last 12 months    Recent Outpatient Visits           3 months ago Chronic obstructive pulmonary disease, unspecified COPD type (HCC)   [facility] [name], PA-C   1 year ago Essential hypertension   [facility] [place], Marzella Schlein, MD   1 year ago COPD with acute exacerbation [facility])   [facility] [name], PA-C   1 year ago No-show for appointment   [facility] Flinchum, Eula Fried, FNP   1 year ago Essential hypertension   [facility] Osvaldo Angst M, [place]       Future Appointments             In 6 days Drubel, Lillia Abed, PA-C [facility]             fluticasone-salmeterol (ADVAIR) 250-50 MCG/ACT AEPB      Sig: Inhale 1 puff into the lungs in the morning and at bedtime.     Pulmonology:  Combination Products Passed - 03/24/2022  3:09 PM      Passed - Valid encounter within last 12 months    Recent Outpatient Visits           3 months ago Chronic obstructive pulmonary disease, unspecified COPD type [facility])   [facility] [name], PA-C   1 year ago Essential hypertension   [facility] [place], Marzella Schlein, MD   1 year ago COPD with  acute exacerbation [facility])   [facility] [name], PA-C   1 year ago No-show for appointment   [facility] Flinchum, Eula Fried, FNP   1 year ago Essential hypertension   [facility] [name], [place]       Future Appointments             In 6 days [name], PA-C [facility]

## 2022-03-24 NOTE — Telephone Encounter (Signed)
Pt states medicaid will not cover trelegy  Pt requesting a 90 day supply

## 2022-03-30 ENCOUNTER — Encounter: Payer: Self-pay | Admitting: Physician Assistant

## 2022-03-30 ENCOUNTER — Ambulatory Visit (INDEPENDENT_AMBULATORY_CARE_PROVIDER_SITE_OTHER): Payer: Medicaid Other | Admitting: Physician Assistant

## 2022-03-30 ENCOUNTER — Other Ambulatory Visit: Payer: Self-pay | Admitting: Physician Assistant

## 2022-03-30 VITALS — BP 130/77 | HR 71 | Ht 62.5 in | Wt 198.0 lb

## 2022-03-30 DIAGNOSIS — I10 Essential (primary) hypertension: Secondary | ICD-10-CM | POA: Diagnosis not present

## 2022-03-30 DIAGNOSIS — J449 Chronic obstructive pulmonary disease, unspecified: Secondary | ICD-10-CM

## 2022-03-30 DIAGNOSIS — E0590 Thyrotoxicosis, unspecified without thyrotoxic crisis or storm: Secondary | ICD-10-CM | POA: Diagnosis not present

## 2022-03-30 DIAGNOSIS — Z23 Encounter for immunization: Secondary | ICD-10-CM

## 2022-03-30 DIAGNOSIS — R7303 Prediabetes: Secondary | ICD-10-CM | POA: Diagnosis not present

## 2022-03-30 DIAGNOSIS — R7309 Other abnormal glucose: Secondary | ICD-10-CM | POA: Diagnosis not present

## 2022-03-30 MED ORDER — ALBUTEROL SULFATE HFA 108 (90 BASE) MCG/ACT IN AERS: 2.0000 | INHALATION_SPRAY | Freq: Four times a day (QID) | RESPIRATORY_TRACT | 2 refills | Status: DC | PRN

## 2022-03-30 MED ORDER — PROAIR HFA 108 (90 BASE) MCG/ACT IN AERS: 2.0000 | INHALATION_SPRAY | Freq: Four times a day (QID) | RESPIRATORY_TRACT | 2 refills | Status: DC | PRN

## 2022-03-30 NOTE — Assessment & Plan Note (Signed)
Advised her A1c last visit was 6.4% close to DM threshold Advised diet, exercise, lifestyle changes Repeat A1c today

## 2022-03-30 NOTE — Assessment & Plan Note (Signed)
Well controlled continue current medications  

## 2022-03-30 NOTE — Progress Notes (Signed)
I,[name],acting as a Education administrator for [employer], PA-C.,have documented all relevant documentation on the behalf of [name], PA-C,as directed by  [name], PA-C while in the presence of [name], PA-C.   Established patient visit   Patient: Christy Mason   DOB: 1960/10/10   61 y.o. Female  MRN: [id number] Visit Date: 03/30/2022  Today's healthcare provider: [name], PA-C   Cc. Htn f/u  Subjective    HPI   Hypertension, follow-up  BP Readings from Last 3 Encounters:  03/30/22 130/77  02/18/22 138/80  01/01/22 132/78   Wt Readings from Last 3 Encounters:  03/30/22 198 lb (89.8 kg)  02/18/22 202 lb (91.6 kg)  01/01/22 203 lb 5.8 oz (92.2 kg)     She was last seen for hypertension 4 months ago.  BP at that visit was 108/92. Management since that visit includes continue current meds.  She reports excellent compliance with treatment. She is not having side effects. She is following a Regular diet. She is exercising. She does not smoke.  Use of agents associated with hypertension: none.   Outside blood pressures are checked on occassions Symptoms: No chest pain No chest pressure  Yes palpitations No syncope  No dyspnea No orthopnea  No paroxysmal nocturnal dyspnea No lower extremity edema   Pertinent labs Lab Results  Component Value Date   CHOL 127 11/26/2021   HDL 55 11/26/2021   LDLCALC 58 11/26/2021   TRIG 65 11/26/2021   CHOLHDL 2.3 11/26/2021   Lab Results  Component Value Date   NA 142 11/26/2021   K 4.9 11/26/2021   CREATININE 1.02 (H) 11/26/2021   EGFR 62 11/26/2021   GLUCOSE 98 11/26/2021   TSH 0.286 (L) 01/30/2020     The ASCVD Risk score (Arnett DK, et al., 2019) failed to calculate for the following reasons:   The valid total cholesterol range is 130 to 320 mg/dL  Medications: Outpatient Medications Prior to Visit  Medication Sig   albuterol (PROVENTIL) (2.5 MG/3ML) 0.083% nebulizer solution USE 1 VIAL  VIA NEBULIZER EVERY 4 HOURS AS NEEDED FOR WHEEZING OR SHORTNESS OF BREATH   aspirin EC 81 MG tablet Take 81 mg by mouth daily.   atorvastatin (LIPITOR) 20 MG tablet TAKE 1 TABLET BY MOUTH EVERY DAY   cetirizine (ZYRTEC) 10 MG tablet Take 10 mg by mouth daily.   cholecalciferol (VITAMIN D3) 25 MCG (1000 UNIT) tablet Take 1,000 Units by mouth daily.   fluticasone-salmeterol (ADVAIR) 250-50 MCG/ACT AEPB Inhale 1 puff into the lungs in the morning and at bedtime.   losartan (COZAAR) 50 MG tablet TAKE 1 TABLET BY MOUTH EVERY DAY   Multiple Vitamin (MULTIVITAMIN WITH MINERALS) TABS tablet Take 1 tablet by mouth daily.   ondansetron (ZOFRAN) 4 MG tablet Take 1 tablet (4 mg total) by mouth every 6 (six) hours as needed for nausea.   SPIRIVA HANDIHALER 18 MCG inhalation capsule INHALE CONTENTS OF 1 CAPSULE ONCE DAILY USING HANDIHALER.   SUBOXONE 8-2 MG FILM Take by mouth 2 (two) times daily.   triamcinolone (NASACORT) 55 MCG/ACT AERO nasal inhaler Place 1 spray into the nose daily as needed (allergies).   [DISCONTINUED] PROAIR HFA 108 (90 Base) MCG/ACT inhaler Inhale 2 puffs into the lungs every 6 (six) hours as needed for wheezing or shortness of breath.   [DISCONTINUED] amoxicillin-clavulanate (AUGMENTIN) 875-125 MG tablet Take 1 tablet by mouth 2 (two) times daily. (Patient not taking: Reported on 02/18/2022)   [DISCONTINUED] clopidogrel (  PLAVIX) 75 MG tablet TAKE 1 TABLET BY MOUTH EVERY DAY (Patient not taking: Reported on 03/30/2022)   No facility-administered medications prior to visit.    Review of Systems  Constitutional:  Negative for fatigue and fever.  Respiratory:  Negative for cough and shortness of breath.   Cardiovascular:  Negative for chest pain and leg swelling.  Gastrointestinal:  Negative for abdominal pain.  Neurological:  Negative for dizziness and headaches.      Objective    Blood pressure 130/77, pulse 71, height 5' 2.5" (1.588 m), weight 198 lb (89.8 kg), SpO2 97 %.    Physical Exam Constitutional:      General: She is awake.     Appearance: She is well-developed.  HENT:     Head: Normocephalic.  Eyes:     Conjunctiva/sclera: Conjunctivae normal.  Cardiovascular:     Rate and Rhythm: Normal rate and regular rhythm.     Heart sounds: Normal heart sounds.  Pulmonary:     Effort: Pulmonary effort is normal.     Breath sounds: Normal breath sounds.  Skin:    General: Skin is warm.  Neurological:     Mental Status: She is alert and oriented to person, place, and time.  Psychiatric:        Attention and Perception: Attention normal.        Mood and Affect: Mood normal.        Speech: Speech normal.        Behavior: Behavior is cooperative.      No results found for any visits on 03/30/22.  Assessment & Plan     Problem List Items Addressed This Visit       Cardiovascular and Mediastinum   Essential hypertension - Primary    Well controlled continue current medications      Relevant Orders   Comprehensive metabolic panel   CBC with Differential/Platelet     Respiratory   COPD (chronic obstructive pulmonary disease) (HCC)    Controlled, follows with pulm. Unable to get trelegy 2/2 insurance. Continue with advair and spiriva w/ prn albuterol       Relevant Medications   PROAIR HFA 108 (90 Base) MCG/ACT inhaler   Other Relevant Orders   CBC with Differential/Platelet     Endocrine   Hyperthyroidism    H/o hyperthyroid with thyroid storm currently unmedicated Will get tsh/t4 today advised if abnormal would refer to endo      Relevant Orders   TSH + free T4     Other   Prediabetes    Advised her A1c last visit was 6.4% close to DM threshold Advised diet, exercise, lifestyle changes Repeat A1c today      Relevant Orders   HgB A1c   Need for prophylactic vaccination against Streptococcus pneumoniae (pneumococcus)    Pt is 61 but high risk with COPD      Other Visit Diagnoses     Needs flu shot             Return in about 4 months (around 07/30/2022) for CPE.      I, [name], PA-C have reviewed all documentation for this visit. The documentation on  03/30/2022  for the exam, diagnosis, procedures, and orders are all accurate and complete.  [name], PA-C [facility] [address] [place], [place], [address] Office: [phone] Fax: [place]

## 2022-03-30 NOTE — Assessment & Plan Note (Signed)
Pt is 9 but high risk with COPD

## 2022-03-30 NOTE — Assessment & Plan Note (Signed)
H/o hyperthyroid with thyroid storm currently unmedicated Will get tsh/t4 today advised if abnormal would refer to endo

## 2022-03-30 NOTE — Assessment & Plan Note (Signed)
Controlled, follows with pulm. Unable to get trelegy 2/2 insurance. Continue with advair and spiriva w/ prn albuterol

## 2022-03-31 LAB — COMPREHENSIVE METABOLIC PANEL
ALT: 14 IU/L (ref 0–32)
AST: 24 IU/L (ref 0–40)
Albumin/Globulin Ratio: 1.3 (ref 1.2–2.2)
Albumin: 4 g/dL (ref 3.9–4.9)
Alkaline Phosphatase: 171 IU/L — ABNORMAL HIGH (ref 44–121)
BUN/Creatinine Ratio: 12 (ref 12–28)
BUN: 10 mg/dL (ref 8–27)
Bilirubin Total: 0.2 mg/dL (ref 0.0–1.2)
CO2: 24 mmol/L (ref 20–29)
Calcium: 9.8 mg/dL (ref 8.7–10.3)
Chloride: 101 mmol/L (ref 96–106)
Creatinine, Ser: 0.84 mg/dL (ref 0.57–1.00)
Globulin, Total: 3 g/dL (ref 1.5–4.5)
Glucose: 109 mg/dL — ABNORMAL HIGH (ref 70–99)
Potassium: 5 mmol/L (ref 3.5–5.2)
Sodium: 142 mmol/L (ref 134–144)
Total Protein: 7 g/dL (ref 6.0–8.5)
eGFR: 78 mL/min/{1.73_m2} (ref >59)

## 2022-03-31 LAB — CBC WITH DIFFERENTIAL/PLATELET
Basophils Absolute: 0.1 10*3/uL (ref 0.0–0.2)
Basos: 1 %
EOS (ABSOLUTE): 0.4 10*3/uL (ref 0.0–0.4)
Eos: 4 %
Hematocrit: 38.5 % (ref 34.0–46.6)
Hemoglobin: 12.5 g/dL (ref 11.1–15.9)
Immature Grans (Abs): 0 10*3/uL (ref 0.0–0.1)
Immature Granulocytes: 0 %
Lymphocytes Absolute: 3.4 10*3/uL — ABNORMAL HIGH (ref 0.7–3.1)
Lymphs: 33 %
MCH: 27.6 pg (ref 26.6–33.0)
MCHC: 32.5 g/dL (ref 31.5–35.7)
MCV: 85 fL (ref 79–97)
Monocytes Absolute: 1 10*3/uL — ABNORMAL HIGH (ref 0.1–0.9)
Monocytes: 9 %
Neutrophils Absolute: 5.5 10*3/uL (ref 1.4–7.0)
Neutrophils: 53 %
Platelets: 257 10*3/uL (ref 150–450)
RBC: 4.53 x10E6/uL (ref 3.77–5.28)
RDW: 15.1 % (ref 11.7–15.4)
WBC: 10.3 10*3/uL (ref 3.4–10.8)

## 2022-03-31 LAB — HEMOGLOBIN A1C
Est. average glucose Bld gHb Est-mCnc: 137 mg/dL
Hgb A1c MFr Bld: 6.4 % — ABNORMAL HIGH (ref 4.8–5.6)

## 2022-03-31 LAB — TSH+FREE T4
Free T4: 1.4 ng/dL (ref 0.82–1.77)
TSH: 1.79 u[IU]/mL (ref 0.450–4.500)

## 2022-04-07 DIAGNOSIS — J449 Chronic obstructive pulmonary disease, unspecified: Secondary | ICD-10-CM | POA: Diagnosis not present

## 2022-05-07 DIAGNOSIS — J449 Chronic obstructive pulmonary disease, unspecified: Secondary | ICD-10-CM | POA: Diagnosis not present

## 2022-05-10 ENCOUNTER — Encounter (INDEPENDENT_AMBULATORY_CARE_PROVIDER_SITE_OTHER): Payer: Self-pay

## 2022-06-07 DIAGNOSIS — J449 Chronic obstructive pulmonary disease, unspecified: Secondary | ICD-10-CM | POA: Diagnosis not present

## 2022-06-17 ENCOUNTER — Other Ambulatory Visit: Payer: Self-pay | Admitting: Physician Assistant

## 2022-06-17 NOTE — Telephone Encounter (Signed)
Requested Prescriptions  Pending Prescriptions Disp Refills   ADVAIR DISKUS 250-50 MCG/ACT AEPB [Pharmacy Med Name: ADVAIR 250-50 DISKUS] 180 each 3    Sig: INHALE 1 PUFF INTO THE LUNGS IN THE MORNING AND AT BEDTIME.     Pulmonology:  Combination Products Passed - 06/17/2022  1:22 PM      Passed - Valid encounter within last 12 months    Recent Outpatient Visits           2 months ago Essential hypertension   [facility] [name], PA-C   6 months ago Chronic obstructive pulmonary disease, unspecified COPD type [facility])   [facility] [name], PA-C   1 year ago Essential hypertension   [facility] Geraldine, Marzella Schlein, MD   1 year ago COPD with acute exacerbation [facility])   [facility] [name], PA-C   1 year ago No-show for appointment   [facility] Flinchum, Eula Fried, FNP       Future Appointments             In 1 month Ok Edwards, Lillia Abed, PA-C [facility]

## 2022-07-07 DIAGNOSIS — J449 Chronic obstructive pulmonary disease, unspecified: Secondary | ICD-10-CM | POA: Diagnosis not present

## 2022-08-03 ENCOUNTER — Encounter: Payer: Self-pay | Admitting: Physician Assistant

## 2022-08-03 ENCOUNTER — Other Ambulatory Visit: Payer: Self-pay | Admitting: Physician Assistant

## 2022-08-03 ENCOUNTER — Ambulatory Visit (INDEPENDENT_AMBULATORY_CARE_PROVIDER_SITE_OTHER): Payer: Medicaid Other | Admitting: Physician Assistant

## 2022-08-03 VITALS — BP 108/74 | HR 88 | Ht 62.5 in | Wt 190.0 lb

## 2022-08-03 DIAGNOSIS — J449 Chronic obstructive pulmonary disease, unspecified: Secondary | ICD-10-CM

## 2022-08-03 DIAGNOSIS — J0110 Acute frontal sinusitis, unspecified: Secondary | ICD-10-CM | POA: Diagnosis not present

## 2022-08-03 DIAGNOSIS — K219 Gastro-esophageal reflux disease without esophagitis: Secondary | ICD-10-CM | POA: Insufficient documentation

## 2022-08-03 DIAGNOSIS — I10 Essential (primary) hypertension: Secondary | ICD-10-CM

## 2022-08-03 DIAGNOSIS — Z0000 Encounter for general adult medical examination without abnormal findings: Secondary | ICD-10-CM

## 2022-08-03 DIAGNOSIS — Z1231 Encounter for screening mammogram for malignant neoplasm of breast: Secondary | ICD-10-CM | POA: Diagnosis not present

## 2022-08-03 DIAGNOSIS — G8929 Other chronic pain: Secondary | ICD-10-CM | POA: Diagnosis not present

## 2022-08-03 DIAGNOSIS — M545 Low back pain, unspecified: Secondary | ICD-10-CM | POA: Insufficient documentation

## 2022-08-03 DIAGNOSIS — R7303 Prediabetes: Secondary | ICD-10-CM | POA: Diagnosis not present

## 2022-08-03 DIAGNOSIS — E782 Mixed hyperlipidemia: Secondary | ICD-10-CM

## 2022-08-03 DIAGNOSIS — H9313 Tinnitus, bilateral: Secondary | ICD-10-CM | POA: Diagnosis not present

## 2022-08-03 DIAGNOSIS — R739 Hyperglycemia, unspecified: Secondary | ICD-10-CM | POA: Diagnosis not present

## 2022-08-03 MED ORDER — LIDOCAINE 5 % EX OINT: 1.0000 | TOPICAL_OINTMENT | Freq: Two times a day (BID) | CUTANEOUS | 3 refills | Status: DC | PRN

## 2022-08-03 MED ORDER — TRELEGY ELLIPTA 100-62.5-25 MCG/ACT IN AEPB: 1.0000 | INHALATION_SPRAY | Freq: Every day | RESPIRATORY_TRACT | 3 refills | Status: DC

## 2022-08-03 MED ORDER — OMEPRAZOLE 20 MG PO CPDR: 20.0000 mg | DELAYED_RELEASE_CAPSULE | Freq: Every day | ORAL | 1 refills | Status: DC

## 2022-08-03 MED ORDER — AMOXICILLIN 875 MG PO TABS: 875.0000 mg | ORAL_TABLET | Freq: Two times a day (BID) | ORAL | 0 refills | Status: AC

## 2022-08-03 NOTE — Assessment & Plan Note (Signed)
Strongly encourage patient to stop BC powders and other NSAIDs. Benign abdominal exam today Rx omeprazole 20 mg If no improvement return to office, advised signs of peptic ulcer disease.

## 2022-08-03 NOTE — Assessment & Plan Note (Signed)
Rx amoxicillin 875 mg bid x 7 days

## 2022-08-03 NOTE — Assessment & Plan Note (Signed)
Patient sees a specialist and is managed on Suboxone. Okay to refill topical lidocaine

## 2022-08-03 NOTE — Assessment & Plan Note (Signed)
Patient managed with atorvastatin 20 mg.  LDL goal under 70, achieved reviewed last lipid panel

## 2022-08-03 NOTE — Patient Instructions (Signed)
Please contact [phone] to schedule your mammogram. You will be asked your location preference to have procedure performed. You have two options listed below.  1) [facility] located at [address] , [place] [address] 2) [facility] [place] located at [address] [place], [place] [address]  Upon results being received our office will contact you. As well as all results can be viewed through your MyChart. Please feel free to contact us if you have any further questions or concerns.   

## 2022-08-03 NOTE — Assessment & Plan Note (Signed)
Patient was previously managed with Trelegy but unable to get due to her previous insurance was controlled with Advair and Spiriva with as needed albuterol She does follow with pulmonology, as she is about to run out of her Advair, given a sample of Trelegy to replace the Advair and the Spiriva. I did send in a prescription and if she needs a PA we will attempt. If insurance does not cover recommending following up pulmonology for further recommendations.

## 2022-08-03 NOTE — Assessment & Plan Note (Signed)
A1c has previously been stable at 6.4%.  Ordered A1c today.

## 2022-08-03 NOTE — Assessment & Plan Note (Signed)
Chronic and well-controlled, managed with losartan 50 mg Continue medication as prescribed Ordered CMP Follow-up 6 months

## 2022-08-03 NOTE — Assessment & Plan Note (Signed)
Encourage patient to see audiology/ENT for tinnitus and hearing evaluation.  Referral placed.

## 2022-08-03 NOTE — Progress Notes (Signed)
I,Christy Mason,acting as a Neurosurgeon for [employer], PA-C.,have documented all relevant documentation on the behalf of Christy Ferguson, PA-C,as directed by  Christy Ferguson, PA-C while in the presence of Christy Ferguson, PA-C.   Complete physical exam   Patient: Christy Mason   DOB: 10-04-1960   62 y.o. Female  MRN: [id number] Visit Date: 08/03/2022  Today's healthcare provider: [name], PA-C   Cc. Cpe, other concerns  Subjective    Christy FAIELLA is a 62 y.o. female who presents today for a complete physical exam.  She reports consuming a general diet. The patient does not participate in regular exercise at present. She generally feels fairly well. She reports sleeping fairly well. She does have additional problems to discuss today.   She reports sinus congestion, headaches, sinus pain, fatigue and postnasal drip for the last week.  Using Nasacort inhaler and saline nasal rinses without relief.  Denies fevers.  She reports epigastric pain and increased acid reflux for the past few weeks.  Has been improved before on omeprazole.  She admits to taking BC powders originally twice a day and now once a day as needed.  She deals with chronic back pain and sees a pain management specialist, but uses lidocaine topically as needed.  Would like a refill.  She has been using her Spiriva inhaler and Advair inhaler but reports that this medication has been on backorder as she is about to run out of her prescription.  She reports bilateral tinnitus for years that has been worsening over the last year.  Thinks her hearing is also impaired as people have told her she is speaking loudly and she has turned the volume on the TV up.  Past Medical History:  Diagnosis Date   Allergy    Anxiety    Arthritis    Asthma    Cataract 2016   Complication of anesthesia    COPD (chronic obstructive pulmonary disease) (HCC)    Emphysema of lung (HCC)    GERD (gastroesophageal reflux disease)     History of hiatal hernia    Hypertension    Hyperthyroidism    h/o   Neuromuscular disorder (HCC)    PONV (postoperative nausea and vomiting)    Thyroid disease    Past Surgical History:  Procedure Laterality Date   ANGIOPLASTY Right 01/01/2020   Procedure: ANGIOPLASTY ans STENT PLACEMENT RIGHT SUBCLAVIAN AND AXILLARY ARTERY;  Surgeon: Renford Dills, MD;  Location: [facility] ORS;  Service: Vascular;  Laterality: Right;   CARPAL TUNNEL RELEASE Right    CHOLECYSTECTOMY     FRACTURE SURGERY Right    plates   FUNCTIONAL ENDOSCOPIC SINUS SURGERY     SALPINGOOPHORECTOMY Right    THROMBECTOMY BRACHIAL ARTERY Right 01/01/2020   Procedure: THROMBECTOMY BRACHIAL AXILLARY ARTERY;  Surgeon: Renford Dills, MD;  Location: [facility];  Service: Vascular;  Laterality: Right;   TUBAL LIGATION     UPPER EXTREMITY ANGIOGRAPHY Right 01/23/2019   Procedure: UPPER EXTREMITY ANGIOGRAPHY;  Surgeon: Renford Dills, MD;  Location: [facility];  Service: Cardiovascular;  Laterality: Right;   UPPER EXTREMITY ANGIOGRAPHY Right 02/07/2019   Procedure: UPPER EXTREMITY ANGIOGRAPHY;  Surgeon: Renford Dills, MD;  Location: [facility];  Service: Cardiovascular;  Laterality: Right;   UPPER EXTREMITY ANGIOGRAPHY Right 01/01/2020   Procedure: UPPER EXTREMITY ANGIOGRAPHY;  Surgeon: Renford Dills, MD;  Location: [facility];  Service: Cardiovascular;  Laterality: Right;   Social History  Socioeconomic History   Marital status: Divorced    Spouse name: Not on file   Number of children: 4   Years of education: Not on file   Highest education level: Not on file  Occupational History   Occupation: [employer]SI  Tobacco Use   Smoking status: Former    Packs/day: 1.00    Years: 30.00    Total pack years: 30.00    Types: Cigarettes    Quit date: 07/13/2019    Years since quitting: 3.0   Smokeless tobacco: Never  Vaping Use   Vaping Use: Former  Substance and Sexual Activity   Alcohol  use: Not Currently   Drug use: Not Currently   Sexual activity: Not Currently    Birth control/protection: None  Other Topics Concern   Not on file  Social History Narrative   Not on file   Social Determinants of Health   Financial Resource Strain: Not on file  Food Insecurity: No Food Insecurity (02/07/2019)   Hunger Vital Sign    Worried About Running Out of Food in the Last Year: Never true    [place] in the Last Year: Never true  Transportation Needs: No Transportation Needs (02/07/2019)   PRAPARE - Hydrologist (Medical): No    Lack of Transportation (Non-Medical): No  Physical Activity: Unknown (02/07/2019)   Exercise Vital Sign    Days of Exercise per Week: 0 days    Minutes of Exercise per Session: Not on file  Stress: No Stress Concern Present (02/07/2019)   [place]    Feeling of Stress : Only a little  Social Connections: Unknown (02/07/2019)   Social Connection and Isolation Panel [NHANES]    Frequency of Communication with Friends and Family: More than three times a week    Frequency of Social Gatherings with Friends and Family: Not on file    Attends Religious Services: Not on file    Active Member of Clubs or Organizations: Not on file    Attends Archivist Meetings: Not on file    Marital Status: Not on file  Intimate Partner Violence: Not At Risk (02/07/2019)   Humiliation, Afraid, Rape, and Kick questionnaire    Fear of Current or Ex-Partner: No    Emotionally Abused: No    Physically Abused: No    Sexually Abused: No   Family Status  Relation Name Status   Mother Bea Laura Deceased   Father Carloyn Manner 11/22/22 Alive   Sister  Alive   Family History  Problem Relation Age of Onset   Depression Mother    Hypertension Mother    COPD Mother    Diabetes Father    Hypertension Father    Allergies  Allergen Reactions   Acetaminophen      Avoid due to fatty liver    Nsaids Other (See Comments)    irritates stomach severley    Other     Steroids-muscle weakness and headache   Sular [Nisoldipine Er] Palpitations    Patient Care Team: [name], PA-C as PCP - General (Physician Assistant) Kate Sable, MD as PCP - Cardiology (Cardiology)   Medications: Outpatient Medications Prior to Visit  Medication Sig   albuterol (PROVENTIL) (2.5 MG/3ML) 0.083% nebulizer solution USE 1 VIAL VIA NEBULIZER EVERY 4 HOURS AS NEEDED FOR WHEEZING OR SHORTNESS OF BREATH   albuterol (VENTOLIN HFA) 108 (90 Base) MCG/ACT inhaler Inhale 2 puffs into the  lungs every 6 (six) hours as needed for wheezing or shortness of breath.   aspirin EC 81 MG tablet Take 81 mg by mouth daily.   atorvastatin (LIPITOR) 20 MG tablet TAKE 1 TABLET BY MOUTH EVERY DAY   cetirizine (ZYRTEC) 10 MG tablet Take 10 mg by mouth daily.   cholecalciferol (VITAMIN D3) 25 MCG (1000 UNIT) tablet Take 1,000 Units by mouth daily.   losartan (COZAAR) 50 MG tablet TAKE 1 TABLET BY MOUTH EVERY DAY   Multiple Vitamin (MULTIVITAMIN WITH MINERALS) TABS tablet Take 1 tablet by mouth daily.   ondansetron (ZOFRAN) 4 MG tablet Take 1 tablet (4 mg total) by mouth every 6 (six) hours as needed for nausea.   SUBOXONE 8-2 MG FILM Take by mouth 2 (two) times daily.   triamcinolone (NASACORT) 55 MCG/ACT AERO nasal inhaler Place 1 spray into the nose daily as needed (allergies).   [DISCONTINUED] fluticasone-salmeterol (ADVAIR DISKUS) 250-50 MCG/ACT AEPB INHALE 1 PUFF INTO THE LUNGS IN THE MORNING AND AT BEDTIME.   [DISCONTINUED] SPIRIVA HANDIHALER 18 MCG inhalation capsule INHALE CONTENTS OF 1 CAPSULE ONCE DAILY USING HANDIHALER.   No facility-administered medications prior to visit.    Review of Systems  HENT:  Positive for congestion, postnasal drip, rhinorrhea, sinus pressure and tinnitus.   Respiratory:  Positive for shortness of breath.   Gastrointestinal:  Positive for  abdominal pain and constipation.  Musculoskeletal:  Positive for arthralgias, back pain, myalgias, neck pain and neck stiffness.  Allergic/Immunologic: Positive for environmental allergies.    Objective    Blood pressure 108/74, pulse 88, height 5' 2.5" (1.588 m), weight 190 lb (86.2 kg), SpO2 94 %.    Physical Exam Constitutional:      General: She is awake.     Appearance: She is well-developed. She is not ill-appearing.  HENT:     Head: Normocephalic.     Right Ear: Tympanic membrane normal.     Left Ear: Tympanic membrane normal.     Nose: Nose normal. No congestion or rhinorrhea.     Mouth/Throat:     Pharynx: No oropharyngeal exudate or posterior oropharyngeal erythema.  Eyes:     Conjunctiva/sclera: Conjunctivae normal.     Pupils: Pupils are equal, round, and reactive to light.  Neck:     Thyroid: No thyroid mass or thyromegaly.  Cardiovascular:     Rate and Rhythm: Normal rate and regular rhythm.     Heart sounds: Normal heart sounds.  Pulmonary:     Effort: Pulmonary effort is normal.     Breath sounds: Normal breath sounds.  Abdominal:     Palpations: Abdomen is soft.     Tenderness: There is no abdominal tenderness.  Musculoskeletal:     Right lower leg: No swelling. No edema.     Left lower leg: No swelling. No edema.  Lymphadenopathy:     Cervical: No cervical adenopathy.  Skin:    General: Skin is warm.  Neurological:     Mental Status: She is alert and oriented to person, place, and time.  Psychiatric:        Attention and Perception: Attention normal.        Mood and Affect: Mood normal.        Speech: Speech normal.        Behavior: Behavior normal. Behavior is cooperative.     Last depression screening scores    08/03/2022    9:10 AM 03/30/2022    9:21 AM 11/26/2021    2:19  PM  PHQ 2/9 Scores  PHQ - 2 Score 0 0 0  PHQ- 9 Score 1 2 1    Last fall risk screening    08/03/2022    9:10 AM  Fall Risk   Falls in the past year? 0  Number falls  in past yr: 0  Injury with Fall? 0  Risk for fall due to : No Fall Risks  Follow up Falls evaluation completed   Last Audit-C alcohol use screening    08/03/2022    9:10 AM  Alcohol Use Disorder Test (AUDIT)  1. How often do you have a drink containing alcohol? 0  2. How many drinks containing alcohol do you have on a typical day when you are drinking? 0  3. How often do you have six or more drinks on one occasion? 0  AUDIT-C Score 0   A score of 3 or more in women, and 4 or more in men indicates increased risk for alcohol abuse, EXCEPT if all of the points are from question 1   No results found for any visits on 08/03/22.  Assessment & Plan    Routine Health Maintenance and Physical Exam  Exercise Activities and Dietary recommendations --balanced diet high in fiber and protein, low in sugars, carbs, fats. --physical activity/exercise 30 minutes 3-5 times a week     Immunization History  Administered Date(s) Administered   Influenza Inj Mdck Quad With Preservative 06/15/2018   Influenza,inj,Quad PF,6+ Mos 03/30/2022   Moderna Sars-Covid-2 Vaccination 03/27/2020, 05/12/2020   PNEUMOCOCCAL CONJUGATE-20 03/30/2022    Health Maintenance  Topic Date Due   DTaP/Tdap/Td (1 - Tdap) Never done   Zoster Vaccines- Shingrix (1 of 2) Never done   Lung Cancer Screening  01/28/2021   COVID-19 Vaccine (3 - 2023-24 season) 03/12/2022   MAMMOGRAM  03/11/2023 (Originally 01/21/2009)   Fecal DNA (Cologuard)  12/03/2023   PAP SMEAR-Modifier  01/10/2024   INFLUENZA VACCINE  Completed   Hepatitis C Screening  Completed   HIV Screening  Completed   HPV VACCINES  Aged Out    Discussed health benefits of physical activity, and encouraged her to engage in regular exercise appropriate for her age and condition.  Problem List Items Addressed This Visit       Cardiovascular and Mediastinum   Essential hypertension    Chronic and well-controlled, managed with losartan 50 mg Continue  medication as prescribed Ordered CMP Follow-up 6 months      Relevant Orders   Comprehensive Metabolic Panel (CMET)     Respiratory   COPD (chronic obstructive pulmonary disease) (HCC)    Patient was previously managed with Trelegy but unable to get due to her previous insurance was controlled with Advair and Spiriva with as needed albuterol She does follow with pulmonology, as she is about to run out of her Advair, given a sample of Trelegy to replace the Advair and the Spiriva. I did send in a prescription and if she needs a PA we will attempt. If insurance does not cover recommending following up pulmonology for further recommendations.      Relevant Medications   Fluticasone-Umeclidin-Vilant (TRELEGY ELLIPTA) 100-62.5-25 MCG/ACT AEPB   Acute sinusitis    Rx amoxicillin 875 mg bid x 7 days      Relevant Medications   amoxicillin (AMOXIL) 875 MG tablet     Digestive   Gastroesophageal reflux disease    Strongly encourage patient to stop BC powders and other NSAIDs. Benign abdominal exam today Rx omeprazole 20  mg If no improvement return to office, advised signs of peptic ulcer disease.      Relevant Medications   omeprazole (PRILOSEC) 20 MG capsule     Other   Mixed hyperlipidemia    Patient managed with atorvastatin 20 mg.  LDL goal under 70, achieved reviewed last lipid panel      Relevant Orders   Comprehensive Metabolic Panel (CMET)   Prediabetes    A1c has previously been stable at 6.4%.  Ordered A1c today.      Relevant Orders   HgB A1c   Chronic low back pain    Patient sees a specialist and is managed on Suboxone. Okay to refill topical lidocaine      Relevant Medications   lidocaine (XYLOCAINE) 5 % ointment   Bilateral tinnitus    Encourage patient to see audiology/ENT for tinnitus and hearing evaluation.  Referral placed.      Relevant Orders   Ambulatory referral to ENT   Other Visit Diagnoses     Annual physical exam    -  Primary    Breast cancer screening by mammogram       Relevant Orders   MM 3D SCREEN BREAST BILATERAL       Patient declined shingles and tetanus vaccines Reminded of need for mammogram and lung cancer screening  Return in about 6 months (around 02/01/2023) for chronic conditions.    I, [name], PA-C have reviewed all documentation for this visit. The documentation on  08/03/22  for the exam, diagnosis, procedures, and orders are all accurate and complete.  [name], PA-C [facility] [address] [place], [place], [address] Office: [phone] Fax: [place]

## 2022-08-04 ENCOUNTER — Other Ambulatory Visit: Payer: Self-pay | Admitting: Physician Assistant

## 2022-08-04 DIAGNOSIS — J449 Chronic obstructive pulmonary disease, unspecified: Secondary | ICD-10-CM

## 2022-08-04 DIAGNOSIS — R748 Abnormal levels of other serum enzymes: Secondary | ICD-10-CM

## 2022-08-04 LAB — COMPREHENSIVE METABOLIC PANEL
ALT: 13 IU/L (ref 0–32)
AST: 23 IU/L (ref 0–40)
Albumin/Globulin Ratio: 1.2 (ref 1.2–2.2)
Albumin: 3.9 g/dL (ref 3.9–4.9)
Alkaline Phosphatase: 168 IU/L — ABNORMAL HIGH (ref 44–121)
BUN/Creatinine Ratio: 8 — ABNORMAL LOW (ref 12–28)
BUN: 7 mg/dL — ABNORMAL LOW (ref 8–27)
Bilirubin Total: 0.3 mg/dL (ref 0.0–1.2)
CO2: 22 mmol/L (ref 20–29)
Calcium: 9.6 mg/dL (ref 8.7–10.3)
Chloride: 104 mmol/L (ref 96–106)
Creatinine, Ser: 0.92 mg/dL (ref 0.57–1.00)
Globulin, Total: 3.2 g/dL (ref 1.5–4.5)
Glucose: 106 mg/dL — ABNORMAL HIGH (ref 70–99)
Potassium: 4.3 mmol/L (ref 3.5–5.2)
Sodium: 144 mmol/L (ref 134–144)
Total Protein: 7.1 g/dL (ref 6.0–8.5)
eGFR: 70 mL/min/{1.73_m2} (ref >59)

## 2022-08-04 LAB — HEMOGLOBIN A1C
Est. average glucose Bld gHb Est-mCnc: 131 mg/dL
Hgb A1c MFr Bld: 6.2 % — ABNORMAL HIGH (ref 4.8–5.6)

## 2022-08-04 NOTE — Telephone Encounter (Signed)
Notified pt to call insurance regarding medication coverage and f/u pulmonology. Pt voiced understanding and will call the office back for insurance info/feedback.

## 2022-08-07 DIAGNOSIS — J449 Chronic obstructive pulmonary disease, unspecified: Secondary | ICD-10-CM | POA: Diagnosis not present

## 2022-08-24 ENCOUNTER — Telehealth: Payer: Self-pay | Admitting: Physician Assistant

## 2022-08-24 ENCOUNTER — Other Ambulatory Visit: Payer: Self-pay | Admitting: Physician Assistant

## 2022-08-24 DIAGNOSIS — G8929 Other chronic pain: Secondary | ICD-10-CM

## 2022-08-24 NOTE — Telephone Encounter (Signed)
Patent called in asking for Trelogy 200 mg instead of the 120m and notes state, PA is needed for Trelogy1085m Please call back with status

## 2022-08-24 NOTE — Telephone Encounter (Signed)
Medication Refill - Medication: Fluticasone-Umeclidin-Vilant (TRELEGY ELLIPTA) 100-62.5-25 MCG/ACT AEPB   Has the patient contacted their pharmacy? Yes.    (Agent: If yes, when and what did the pharmacy advise?) Contact provider. Medication needs a Prior Authorization.   Preferred Pharmacy (with phone number or street name[id number] [place]  Has the patient been seen for an appointment in the last year OR does the patient have an upcoming appointment? Yes.    Agent: Please be advised that RX refills may take up to 3 business days. We ask that you follow-up with your pharmacy.

## 2022-08-24 NOTE — Telephone Encounter (Signed)
Medication Refill - Medication: lidocaine (XYLOCAINE) 5 % ointment   Has the patient contacted their pharmacy? No asking for this to go to different pharmacy (Agent: If no, request that the patient contact the pharmacy for the refill. If patient does not wish to contact the pharmacy document the reason why and proceed with request.) (Agent: If yes, when and what did the pharmacy advise?)patient called directly in  Preferred Pharmacy (with phone number or street name): [facility], [place], [place] [id number] Has the patient been seen for an appointment in the last year OR does the patient have an upcoming appointment? yes  Agent: Please be advised that RX refills may take up to 3 business days. We ask that you follow-up with your pharmacy.

## 2022-08-25 ENCOUNTER — Other Ambulatory Visit: Payer: Self-pay | Admitting: Physician Assistant

## 2022-08-25 DIAGNOSIS — J449 Chronic obstructive pulmonary disease, unspecified: Secondary | ICD-10-CM

## 2022-08-25 MED ORDER — TRELEGY ELLIPTA 200-62.5-25 MCG/ACT IN AEPB: 1.0000 | INHALATION_SPRAY | Freq: Every day | RESPIRATORY_TRACT | 3 refills | Status: DC

## 2022-08-25 NOTE — Telephone Encounter (Signed)
Requested Prescriptions  Pending Prescriptions Disp Refills   lidocaine (XYLOCAINE) 5 % ointment 35 g 3    Sig: Apply 1 Application topically 2 (two) times daily as needed.     Analgesics:  Topicals Failed - 08/24/2022  3:42 PM      Failed - Manual Review: Labs are only required if the patient has taken medication for more than 8 weeks.      Passed - PLT in normal range and within 360 days    Platelets  Date Value Ref Range Status  03/30/2022 257 150 - 450 x10E3/uL Final         Passed - HGB in normal range and within 360 days    Hemoglobin  Date Value Ref Range Status  03/30/2022 12.5 11.1 - 15.9 g/dL Final         Passed - HCT in normal range and within 360 days    Hematocrit  Date Value Ref Range Status  03/30/2022 38.5 34.0 - 46.6 % Final         Passed - Cr in normal range and within 360 days    Creatinine  Date Value Ref Range Status  03/19/2014 0.86 0.60 - 1.30 mg/dL Final   Creatinine, Ser  Date Value Ref Range Status  08/03/2022 0.92 0.57 - 1.00 mg/dL Final         Passed - eGFR is 30 or above and within 360 days    EGFR (African American)  Date Value Ref Range Status  03/19/2014 >60  Final   GFR calc Af Amer  Date Value Ref Range Status  04/10/2020 95 >59 mL/min/1.73 Final    Comment:    **[facility] currently reports eGFR in compliance with the current**   recommendations of the [employer]. [facility] will   update reporting as new guidelines are published from the NKF-ASN   Task force.    EGFR (Non-African Amer.)  Date Value Ref Range Status  03/19/2014 >60  Final    Comment:    eGFR values <55m/min/1.73 m2 may be an indication of chronic kidney disease (CKD). Calculated eGFR is useful in patients with stable renal function. The eGFR calculation will not be reliable in acutely ill patients when serum creatinine is changing rapidly. It is not useful in  patients on dialysis. The eGFR calculation may not be applicable to patients at  the low and high extremes of body sizes, pregnant women, and vegetarians.    GFR calc non Af Amer  Date Value Ref Range Status  04/10/2020 82 >59 mL/min/1.73 Final   eGFR  Date Value Ref Range Status  08/03/2022 70 >59 mL/min/1.73 Final         Passed - Patient is not pregnant      Passed - Valid encounter within last 12 months    Recent Outpatient Visits           3 weeks ago Annual physical exam   [place]   4 months ago Essential hypertension   [place]   9 months ago Chronic obstructive pulmonary disease, unspecified COPD type ([facility]   [place]   1 year ago Essential hypertension   [place]   2 years ago COPD with acute exacerbation ([facility]   [place]      Future Appointments  In 5 months Drubel, Delman Cheadle [facility], [facility]

## 2022-08-25 NOTE — Telephone Encounter (Signed)
Ok sent the 200

## 2022-08-25 NOTE — Telephone Encounter (Signed)
Patient reports refill is not needed. She reports PA is needed for this medication.

## 2022-08-26 NOTE — Telephone Encounter (Signed)
PA Started Key: [id number] PA Case ID# [id number]

## 2022-08-30 NOTE — Telephone Encounter (Signed)
Patient has not picked up  PA Case ID #: [id number]  Status sent to Plan today

## 2022-08-31 ENCOUNTER — Telehealth: Payer: Self-pay | Admitting: Physician Assistant

## 2022-08-31 NOTE — Telephone Encounter (Signed)
The patient states her insurance will no longer cover the Trelegy even with a PA. She states she spoke with the insurance and they told her they would cover the Symbicort. Please assist patient further as [facility]  [id number] [place] [place] [phone] Fax: [phone]    Please assist patient further

## 2022-08-31 NOTE — Telephone Encounter (Signed)
Pt is calling back regarding Fluticasone-Umeclidin-Vilant (TRELEGY ELLIPTA) 200-62.5-25 MCG/ACT AEPB   Pt states that she went to the pharmacy to get the medication and is not able to get it due to PA is needed.   Please advise

## 2022-09-01 ENCOUNTER — Other Ambulatory Visit: Payer: Self-pay | Admitting: Physician Assistant

## 2022-09-01 DIAGNOSIS — J449 Chronic obstructive pulmonary disease, unspecified: Secondary | ICD-10-CM

## 2022-09-01 MED ORDER — BUDESONIDE-FORMOTEROL FUMARATE 160-4.5 MCG/ACT IN AERO: 2.0000 | INHALATION_SPRAY | Freq: Two times a day (BID) | RESPIRATORY_TRACT | 1 refills | Status: DC

## 2022-09-01 MED ORDER — TIOTROPIUM BROMIDE MONOHYDRATE 18 MCG IN CAPS: 18.0000 ug | ORAL_CAPSULE | Freq: Every day | RESPIRATORY_TRACT | 1 refills | Status: DC

## 2022-09-01 NOTE — Telephone Encounter (Signed)
Please advise 

## 2022-09-02 NOTE — Telephone Encounter (Signed)
Pt been notified medication sent to the pharmacy.

## 2022-09-07 DIAGNOSIS — J449 Chronic obstructive pulmonary disease, unspecified: Secondary | ICD-10-CM | POA: Diagnosis not present

## 2022-09-23 ENCOUNTER — Other Ambulatory Visit: Payer: Self-pay | Admitting: Physician Assistant

## 2022-09-23 DIAGNOSIS — J449 Chronic obstructive pulmonary disease, unspecified: Secondary | ICD-10-CM

## 2022-09-27 ENCOUNTER — Encounter: Payer: Self-pay | Admitting: Internal Medicine

## 2022-09-27 ENCOUNTER — Ambulatory Visit (INDEPENDENT_AMBULATORY_CARE_PROVIDER_SITE_OTHER): Payer: Medicaid Other | Admitting: Internal Medicine

## 2022-09-27 VITALS — BP 124/80 | HR 107 | Temp 97.8°F | Ht 62.5 in | Wt 188.8 lb

## 2022-09-27 DIAGNOSIS — J449 Chronic obstructive pulmonary disease, unspecified: Secondary | ICD-10-CM

## 2022-09-27 MED ORDER — TRELEGY ELLIPTA 200-62.5-25 MCG/ACT IN AEPB: 1.0000 | INHALATION_SPRAY | Freq: Every day | RESPIRATORY_TRACT | 0 refills | Status: DC

## 2022-09-27 MED ORDER — TRELEGY ELLIPTA 200-62.5-25 MCG/ACT IN AEPB: 1.0000 | INHALATION_SPRAY | Freq: Every day | RESPIRATORY_TRACT | 10 refills | Status: DC

## 2022-09-27 NOTE — Progress Notes (Signed)
Name: TENICA MCHARGUE MRN: [id number] DOB: Sep 28, 1960     SYNOPSIS 62 year old female former smoker seen for initial pulmonary consult October 30, 2019 for COPD Nov 20, 2019 that showed severe COPD with FEV1 at 45%, ratio 50, FVC 69%, no significant bronchodilator response, positive mid flow reversibility and decreased diffusing capacity at 65%.   6-minute walk test last visit showed no significant desaturations with ambulation.  Overnight oximetry test was completed and did not show any significant desaturations.   July 2021 for COPD exacerbation secondary to RSV infection. Is very sedentary  Needs new nebulizer machine . Uses albuterol few times a week.  Has quit smoking.  Has intermittent allergies , on zyrtec daily .   TEST/EVENTS :  PFTs Nov 20, 2019 showed severe COPD with FEV1 at 45%, ratio 50, FVC 69%, no significant bronchodilator response, mid flow reversibility, DLCO 65%  2D echo July 2021 EF 65 to 70%, normal pulmonary artery systolic pressure, RV SF is normal      CHIEF COMPLAINT:  Follow up COPD Follow up SOB     HISTORY OF PRESENT ILLNESS: Patient was diagnosed with COPD emphysema 2016 Chronic shortness of breath Chronic dyspnea on exertion PATIENT USED AND DID NOT TOLERATE SYMBICORT FOR THE PAST 6 MONTHS   PFT's c/w severe obstructive airways disease   Patient is out of work due to disability 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 Patient currently is on Symbicort and Spiriva   No exacerbation at this time No evidence of heart failure at this time No evidence or signs of infection at this time No respiratory distress No fevers, chills, nausea, vomiting, diarrhea No evidence of lower extremity edema No evidence hemoptysis      PAST MEDICAL HISTORY :   has a past medical history of Allergy, Anxiety, Arthritis, Asthma, Cataract ([id number]), Complication of anesthesia, COPD (chronic obstructive pulmonary  disease) ([place]), Emphysema of lung ([place]), GERD (gastroesophageal reflux disease), History of hiatal hernia, Hypertension, Hyperthyroidism, Neuromuscular disorder ([place]), PONV (postoperative nausea and vomiting), and Thyroid disease.  has a past surgical history that includes Tubal ligation; Functional endoscopic sinus surgery; Fracture surgery (Right); Cholecystectomy; Carpal tunnel release (Right); Upper Extremity Angiography (Right, 01/23/2019); Salpingoophorectomy (Right); Upper Extremity Angiography (Right, 02/07/2019); Upper Extremity Angiography (Right, 01/01/2020); Thrombectomy brachial artery (Right, 01/01/2020); and Angioplasty (Right, 01/01/2020). Prior to Admission medications   Medication Sig Start Date End Date Taking? Authorizing Provider  Ascorbic Acid (VITAMIN C) 100 MG tablet Take 100 mg by mouth daily.     [provider]  aspirin EC 81 MG tablet Take 81 mg by mouth daily.    [provider]  cetirizine (ZYRTEC) 10 MG tablet Take 10 mg by mouth daily.    [provider]  cholecalciferol (VITAMIN D3) 25 MCG (1000 UT) tablet Take 1,000 Units by mouth daily.    [provider]  clopidogrel (PLAVIX) 75 MG tablet Take 1 tablet (75 mg total) by mouth daily. 02/08/19   Schnier, Dolores Lory, MD  Fluticasone-Umeclidin-Vilant (TRELEGY ELLIPTA) 200-62.5-25 MCG/INH AEPB Inhale 1 puff into the lungs daily. 10/08/19   Trinna Post, PA-C  losartan-hydrochlorothiazide (HYZAAR) 50-12.5 MG tablet Take 1 tablet by mouth daily. 09/06/19   Trinna Post, PA-C  metoprolol tartrate (LOPRESSOR) 25 MG tablet Take 1 tablet (25 mg total) by mouth 2 (two) times daily. 02/14/19   Stegmayer, Janalyn Harder, PA-C  Multiple Vitamin (MULTIVITAMIN WITH MINERALS) TABS tablet Take 1 tablet by mouth daily.  [provider]  nicotine (NICOTROL) 10 MG inhaler Inhale 1 Cartridge (1 continuous puffing total) into the lungs as needed for smoking cessation. 10/08/19   Trinna Post, PA-C   oxyCODONE (OXY IR/ROXICODONE) 5 MG immediate release tablet One to Two Tabs Every Six Hours As Needed For Pain 02/14/19   Sela Hua, PA-C  PROAIR HFA 108 (90 Base) MCG/ACT inhaler INHALE 2 PUFFS INTO THE LUNGS EVERY 6 HOURS AS NEEDED FOR WHEEZING OR SHORTNESS OF BREATH 07/20/19   Trinna Post, PA-C  sertraline (ZOLOFT) 50 MG tablet Take 1 tablet (50 mg total) by mouth daily. 09/06/19   Trinna Post, PA-C  SPIRIVA HANDIHALER 18 MCG inhalation capsule INHALE CONTENTS OF 1 CAPSULE ONCE DAILY USING HANDIHALER 10/03/19   Carles Collet M, PA-C  SUBOXONE 8-2 MG FILM Take 0.5 Film by mouth See admin instructions. Take 0.5 film by mouth scheduled in the morning, may take other 0.5 film if needed for pain. 12/21/18   [provider]  triamcinolone (NASACORT ALLERGY 24HR) 55 MCG/ACT AERO nasal inhaler Place 1 spray into the nose daily.    [provider]  vitamin E 100 UNIT capsule Take 100 Units by mouth daily.     [provider]   Allergies  Allergen Reactions   Acetaminophen     Avoid due to fatty liver    Nsaids Other (See Comments)    irritates stomach severley    Other     Steroids-muscle weakness and headache   Sular [Nisoldipine Er] Palpitations    FAMILY HISTORY:  family history includes COPD in her mother; Depression in her mother; Diabetes in her father; Hypertension in her father and mother. SOCIAL HISTORY:  reports that she quit smoking about 3 years ago. Her smoking use included cigarettes. She has a 30.00 pack-year smoking history. She has never used smokeless tobacco. She reports that she does not currently use alcohol. She reports that she does not currently use drugs.    BP 124/80 (BP Location: Left Arm, Cuff Size: Normal)   Pulse (!) 107   Temp 97.8 F (36.6 C) (Temporal)   Ht 5' 2.5" (1.588 m)   Wt 188 lb 12.8 oz (85.6 kg)   SpO2 94%   BMI 33.98 kg/m       Review of Systems: Gen:  Denies  fever, sweats, chills weight  loss  HEENT: Denies blurred vision, double vision, ear pain, eye pain, hearing loss, nose bleeds, sore throat Cardiac:  No dizziness, chest pain or heaviness, chest tightness,edema, No JVD Resp:   No cough, -sputum production,  +shortness of breath,+wheezing, -hemoptysis,  Other:  All other systems negative   Physical Examination:   General Appearance: No distress  EYES PERRLA, EOM intact.   NECK Supple, No JVD Pulmonary: normal breath sounds, No wheezing.  CardiovascularNormal S1,S2.  No m/r/g.   Abdomen: Benign, Soft, non-tender. Neurology UE/LE 5/5 strength, no focal deficits Ext pulses intact, cap refill intact ALL OTHER ROS ARE NEGATIVE     MEDICATIONS: I have reviewed all medications and confirmed regimen as documented     ASSESSMENT AND PLAN SYNOPSIS  62 year old pleasant white female with underlying diagnosis of severe COPD FEV1 45% predicted with increased shortness of breath dyspnea on exertion respiratory insufficiency with a previous history of extensive smoking history  COPD end-stage severe FEV1 45% predicted Symbicort has been used in patient is not responding to Symbicort anymore Recommend Trelegy 200 Avoid secondhand smoke Albuterol as needed  Extensive smoking history  Recommend lung cancer screening referral program  Obesity -recommend significant weight loss -recommend changing diet  Deconditioned state -Recommend increased daily activity and exercise Continue incentive spirometry as you are doing     MEDICATION ADJUSTMENTS/LABS AND TESTS ORDERED: PATIENT USED AND DID NOT TOLERATE SYMBICORT FOR THE PAST 6 MONTHS  START TRELEGY 200 USE AS DIRECTED  CONTINUE TO DO BREATHING EXERCISES  REFERRAL TO LUNG CANCER SCREENING PROGRAM  CURRENT MEDICATIONS REVIEWED AT LENGTH WITH PATIENT TODAY   Patient satisfied with Plan of action and management. All questions answered  Total time spent 32 minutes  Follow-up in 1 year   Karriem Muench Patricia Pesa, M.D.  [facility]  Medical Director [place] Director [facility] Cardio-Pulmonary Department

## 2022-09-27 NOTE — Patient Instructions (Addendum)
PATIENT USED AND DID NOT TOLERATE SYMBICORT FOR THE PAST 6 MONTHS  START TRELEGY 200 USE AS DIRECTED  CONTINUE TO DO BREATHING EXERCISES  REFERRAL TO LUNG CANCER SCREENING PROGRAM

## 2022-09-29 ENCOUNTER — Ambulatory Visit
Admission: RE | Admit: 2022-09-29 | Discharge: 2022-09-29 | Disposition: A | Discharge status: Home / Self Care | Payer: Medicaid Other | Source: Ambulatory Visit | Attending: Physician Assistant | Admitting: Physician Assistant

## 2022-09-29 DIAGNOSIS — R945 Abnormal results of liver function studies: Secondary | ICD-10-CM | POA: Diagnosis not present

## 2022-09-29 DIAGNOSIS — Z9049 Acquired absence of other specified parts of digestive tract: Secondary | ICD-10-CM | POA: Diagnosis not present

## 2022-09-29 DIAGNOSIS — R748 Abnormal levels of other serum enzymes: Secondary | ICD-10-CM

## 2022-10-06 DIAGNOSIS — J449 Chronic obstructive pulmonary disease, unspecified: Secondary | ICD-10-CM | POA: Diagnosis not present

## 2022-10-19 ENCOUNTER — Ambulatory Visit
Admission: RE | Admit: 2022-10-19 | Discharge: 2022-10-19 | Disposition: A | Discharge status: Home / Self Care | Payer: Medicaid Other | Source: Ambulatory Visit | Attending: Physician Assistant | Admitting: Physician Assistant

## 2022-10-19 ENCOUNTER — Telehealth: Payer: Self-pay

## 2022-10-19 ENCOUNTER — Other Ambulatory Visit (HOSPITAL_COMMUNITY): Payer: Self-pay

## 2022-10-19 DIAGNOSIS — Z1231 Encounter for screening mammogram for malignant neoplasm of breast: Secondary | ICD-10-CM | POA: Diagnosis not present

## 2022-10-19 MED ORDER — TRELEGY ELLIPTA 200-62.5-25 MCG/ACT IN AEPB: 1.0000 | INHALATION_SPRAY | Freq: Every day | RESPIRATORY_TRACT | 0 refills | Status: DC

## 2022-10-19 NOTE — Telephone Encounter (Signed)
Received approval letter from [employer] for trelegy. Approved through 10/19/2023. Patient is aware and voiced her understanding. Nothing further needed.

## 2022-10-19 NOTE — Telephone Encounter (Signed)
PA has been submitted and will be updated in additional encounter created.  

## 2022-10-19 NOTE — Telephone Encounter (Signed)
PA request received via provider for Trelegy Ellipta 200-62.5-25MCG/ACT aerosol powder  PA has been submitted to [employer] via CMM and is pending determination  Key: [id number]

## 2022-10-19 NOTE — Telephone Encounter (Signed)
Patient in office, requesting sample of trelegy 200 and update on PA. One sample provided.   PA team, please advise on PA. Patient unable to tolerate Symbicort.

## 2022-10-20 ENCOUNTER — Other Ambulatory Visit: Payer: Self-pay | Admitting: *Deleted

## 2022-10-20 ENCOUNTER — Inpatient Hospital Stay
Admission: RE | Admit: 2022-10-20 | Discharge: 2022-10-20 | Disposition: A | Discharge status: Home / Self Care | Payer: Self-pay | Source: Ambulatory Visit | Attending: Physician Assistant | Admitting: Physician Assistant

## 2022-10-20 DIAGNOSIS — Z1231 Encounter for screening mammogram for malignant neoplasm of breast: Secondary | ICD-10-CM

## 2022-10-20 NOTE — Telephone Encounter (Signed)
PA has been APPROVED from 10/19/2022-10/19/2023

## 2022-10-30 ENCOUNTER — Other Ambulatory Visit: Payer: Self-pay | Admitting: Physician Assistant

## 2022-10-30 DIAGNOSIS — I10 Essential (primary) hypertension: Secondary | ICD-10-CM

## 2022-11-01 DIAGNOSIS — H90A22 Sensorineural hearing loss, unilateral, left ear, with restricted hearing on the contralateral side: Secondary | ICD-10-CM | POA: Diagnosis not present

## 2022-11-01 DIAGNOSIS — H9313 Tinnitus, bilateral: Secondary | ICD-10-CM | POA: Diagnosis not present

## 2022-11-12 ENCOUNTER — Telehealth: Payer: Self-pay | Admitting: Physician Assistant

## 2022-11-12 NOTE — Telephone Encounter (Signed)
[facility] pharmacy requesting prior authorization Key: [id number] Name: [facility] Tiotropium Bromide Monohydrate 18 MCG capsules

## 2022-12-18 ENCOUNTER — Other Ambulatory Visit: Payer: Self-pay | Admitting: Physician Assistant

## 2022-12-18 DIAGNOSIS — E782 Mixed hyperlipidemia: Secondary | ICD-10-CM

## 2022-12-20 NOTE — Telephone Encounter (Signed)
Requested Prescriptions  Pending Prescriptions Disp Refills   atorvastatin (LIPITOR) 20 MG tablet [Pharmacy Med Name: ATORVASTATIN 20 MG TABLET] 90 tablet 0    Sig: TAKE 1 TABLET BY MOUTH EVERY DAY     Cardiovascular:  Antilipid - Statins Failed - 12/18/2022  1:11 AM      Failed - Lipid Panel in normal range within the last 12 months    Cholesterol, Total  Date Value Ref Range Status  11/26/2021 127 100 - 199 mg/dL Final   LDL Chol Calc (NIH)  Date Value Ref Range Status  11/26/2021 58 0 - 99 mg/dL Final   HDL  Date Value Ref Range Status  11/26/2021 55 >39 mg/dL Final   Triglycerides  Date Value Ref Range Status  11/26/2021 65 0 - 149 mg/dL Final         Passed - Patient is not pregnant      Passed - Valid encounter within last 12 months    Recent Outpatient Visits           4 months ago Annual physical exam   [facility] [facility] [name], PA-C   8 months ago Essential hypertension   [place] [facility] [name], PA-C   1 year ago Chronic obstructive pulmonary disease, unspecified COPD type [facility])   [facility] [name], PA-C   2 years ago Essential hypertension   [facility] [place], Marzella Schlein, MD   2 years ago COPD with acute exacerbation [facility])   [facility] [name], [place]       Future Appointments             In 1 month Ok Edwards, Lou Cal [facility], [facility]

## 2023-01-03 ENCOUNTER — Telehealth: Payer: Self-pay | Admitting: Physician Assistant

## 2023-01-03 ENCOUNTER — Other Ambulatory Visit: Payer: Self-pay | Admitting: Physician Assistant

## 2023-01-03 DIAGNOSIS — J449 Chronic obstructive pulmonary disease, unspecified: Secondary | ICD-10-CM

## 2023-01-03 DIAGNOSIS — K219 Gastro-esophageal reflux disease without esophagitis: Secondary | ICD-10-CM

## 2023-02-01 ENCOUNTER — Ambulatory Visit: Payer: Medicaid Other | Admitting: Physician Assistant

## 2023-02-11 ENCOUNTER — Other Ambulatory Visit (INDEPENDENT_AMBULATORY_CARE_PROVIDER_SITE_OTHER): Payer: Self-pay | Admitting: Nurse Practitioner

## 2023-02-11 DIAGNOSIS — I771 Stricture of artery: Secondary | ICD-10-CM

## 2023-02-13 NOTE — Progress Notes (Unsigned)
MRN : [id number]  Christy Mason is a 62 y.o. (1961/05/14) female who presents with chief complaint of check circulation.  History of Present Illness:   presents today as a return for evaluation due to her right subclavian stent placement on 01/23/2019.  The patient was last seen in 2021 and since that time she denies any significant problems or issues with her upper extremity.  She denies any pain.  She denies any numbness and tingling.  She has no dizziness noted.  Overall she is doing well.   Today noninvasive studies show biphasic/triphasic waveforms throughout the right upper extremity no notable significant obstruction.  Patent subclavian stent.  No outpatient medications have been marked as taking for the 02/14/23 encounter (Appointment) with Gilda Crease, Latina Craver, MD.    Past Medical History:  Diagnosis Date   Allergy    Anxiety    Arthritis    Asthma    Cataract 2016   Complication of anesthesia    COPD (chronic obstructive pulmonary disease) (HCC)    Emphysema of lung (H[facility])    GERD (gastroesophageal reflux disease)    History of hiatal hernia    Hypertension    Hyperthyroidism    h/o   Neuromuscular disorder (H[facility])    PONV (postoperative nausea and vomiting)    Thyroid disease     Past Surgical History:  Procedure Laterality Date   ANGIOPLASTY Right 01/01/2020   Procedure: ANGIOPLASTY ans STENT PLACEMENT RIGHT SUBCLAVIAN AND AXILLARY ARTERY;  Surgeon: Renford Dills, MD;  Location: [facility] ORS;  Service: Vascular;  Laterality: Right;   CARPAL TUNNEL RELEASE Right    CHOLECYSTECTOMY     FRACTURE SURGERY Right    plates   FUNCTIONAL ENDOSCOPIC SINUS SURGERY     SALPINGOOPHORECTOMY Right    THROMBECTOMY BRACHIAL ARTERY Right 01/01/2020   Procedure: THROMBECTOMY BRACHIAL AXILLARY ARTERY;  Surgeon: Renford Dills, MD;  Location: [facility] ORS;  Service: Vascular;  Laterality: Right;   TUBAL LIGATION      UPPER EXTREMITY ANGIOGRAPHY Right 01/23/2019   Procedure: UPPER EXTREMITY ANGIOGRAPHY;  Surgeon: Renford Dills, MD;  Location: [facility] INVASIVE CV LAB;  Service: Cardiovascular;  Laterality: Right;   UPPER EXTREMITY ANGIOGRAPHY Right 02/07/2019   Procedure: UPPER EXTREMITY ANGIOGRAPHY;  Surgeon: Renford Dills, MD;  Location: [facility] INVASIVE CV LAB;  Service: Cardiovascular;  Laterality: Right;   UPPER EXTREMITY ANGIOGRAPHY Right 01/01/2020   Procedure: UPPER EXTREMITY ANGIOGRAPHY;  Surgeon: Renford Dills, MD;  Location: [facility] INVASIVE CV LAB;  Service: Cardiovascular;  Laterality: Right;    Social History Social History   Tobacco Use   Smoking status: Former    Current packs/day: 0.00    Average packs/day: 1 pack/day for 30.0 years (30.0 ttl pk-yrs)    Types: Cigarettes    Start date: 07/12/1989    Quit date: 07/13/2019    Years since quitting: 3.5   Smokeless tobacco: Never  Vaping Use   Vaping status: Former  Substance Use Topics   Alcohol use: Not Currently   Drug use: Not Currently    Family History Family History  Problem Relation  Age of Onset   Depression Mother    Hypertension Mother    COPD Mother    Diabetes Father    Hypertension Father    Breast cancer Paternal Aunt     Allergies  Allergen Reactions   Acetaminophen     Avoid due to fatty liver    Nsaids Other (See Comments)    irritates stomach severley    Other     Steroids-muscle weakness and headache   Sular [Nisoldipine Er] Palpitations     REVIEW OF SYSTEMS (Negative unless checked)  Constitutional: [] Weight loss  [] Fever  [] Chills Cardiac: [] Chest pain   [] Chest pressure   [] Palpitations   [] Shortness of breath when laying flat   [] Shortness of breath with exertion. Vascular:  [x] Pain in legs with walking   [] Pain in legs at rest  [] History of DVT   [] Phlebitis   [] Swelling in legs   [] Varicose veins   [] Non-healing ulcers Pulmonary:   [] Uses home oxygen   [] Productive cough   [] Hemoptysis    [] Wheeze  [] COPD   [] Asthma Neurologic:  [] Dizziness   [] Seizures   [] History of stroke   [] History of TIA  [] Aphasia   [] Vissual changes   [] Weakness or numbness in arm   [] Weakness or numbness in leg Musculoskeletal:   [] Joint swelling   [] Joint pain   [] Low back pain Hematologic:  [] Easy bruising  [] Easy bleeding   [] Hypercoagulable state   [] Anemic Gastrointestinal:  [] Diarrhea   [] Vomiting  [] Gastroesophageal reflux/heartburn   [] Difficulty swallowing. Genitourinary:  [] Chronic kidney disease   [] Difficult urination  [] Frequent urination   [] Blood in urine Skin:  [] Rashes   [] Ulcers  Psychological:  [] History of anxiety   []  History of major depression.  Physical Examination  There were no vitals filed for this visit. There is no height or weight on file to calculate BMI. Gen: WD/WN, NAD Head: [place]/AT, No temporalis wasting.  Ear/Nose/Throat: Hearing grossly intact, nares w/o erythema or drainage Eyes: PER, EOMI, sclera nonicteric.  Neck: Supple, no masses.  No bruit or JVD.  Pulmonary:  Good air movement, no audible wheezing, no use of accessory muscles.  Cardiac: RRR, normal S1, S2, no Murmurs. Vascular:  mild trophic changes, no open wounds Vessel Right Left  Radial Palpable Palpable  PT Not Palpable Not Palpable  DP Not Palpable Not Palpable  Gastrointestinal: soft, non-distended. No guarding/no peritoneal signs.  Musculoskeletal: M/S 5/5 throughout.  No visible deformity.  Neurologic: CN 2-12 intact. Pain and light touch intact in extremities.  Symmetrical.  Speech is fluent. Motor exam as listed above. Psychiatric: Judgment intact, Mood & affect appropriate for pt's clinical situation. Dermatologic: No rashes or ulcers noted.  No changes consistent with cellulitis.   CBC Lab Results  Component Value Date   WBC 10.3 03/30/2022   HGB 12.5 03/30/2022   HCT 38.5 03/30/2022   MCV 85 03/30/2022   PLT 257 03/30/2022    BMET    Component Value Date/Time   NA 144  08/03/2022 0947   NA 136 03/19/2014 1818   K 4.3 08/03/2022 0947   K 3.0 (L) 03/19/2014 1818   CL 104 08/03/2022 0947   CL 101 03/19/2014 1818   CO2 22 08/03/2022 0947   CO2 24 03/19/2014 1818   GLUCOSE 106 (H) 08/03/2022 0947   GLUCOSE 130 (H) 02/08/2020 0434   GLUCOSE 139 (H) 03/19/2014 1818   BUN 7 (L) 08/03/2022 0947   BUN 10 03/19/2014 1818   CREATININE 0.92 08/03/2022 0947  CREATININE 0.86 03/19/2014 1818   CALCIUM 9.6 08/03/2022 0947   CALCIUM 10.4 (H) 03/19/2014 1818   GFRNONAA 82 04/10/2020 1518   GFRNONAA >60 03/19/2014 1818   GFRAA 95 04/10/2020 1518   GFRAA >60 03/19/2014 1818   CrCl cannot be calculated (Patient's most recent lab result is older than the maximum 21 days allowed.).  COAG Lab Results  Component Value Date   INR 1.1 01/01/2020   INR 1.1 02/12/2019    Radiology No results found.   Assessment/Plan There are no diagnoses linked to this encounter.   Levora Dredge, MD  02/13/2023 2:56 PM

## 2023-02-14 ENCOUNTER — Ambulatory Visit (INDEPENDENT_AMBULATORY_CARE_PROVIDER_SITE_OTHER): Payer: Medicaid Other

## 2023-02-14 ENCOUNTER — Encounter (INDEPENDENT_AMBULATORY_CARE_PROVIDER_SITE_OTHER): Payer: Self-pay | Admitting: Vascular Surgery

## 2023-02-14 ENCOUNTER — Ambulatory Visit (INDEPENDENT_AMBULATORY_CARE_PROVIDER_SITE_OTHER): Payer: Medicaid Other | Admitting: Vascular Surgery

## 2023-02-14 VITALS — BP 133/81 | HR 74 | Resp 16 | Wt 195.4 lb

## 2023-02-14 DIAGNOSIS — E782 Mixed hyperlipidemia: Secondary | ICD-10-CM | POA: Diagnosis not present

## 2023-02-14 DIAGNOSIS — I10 Essential (primary) hypertension: Secondary | ICD-10-CM | POA: Diagnosis not present

## 2023-02-14 DIAGNOSIS — I7776 Dissection of artery of upper extremity: Secondary | ICD-10-CM

## 2023-02-14 DIAGNOSIS — I771 Stricture of artery: Secondary | ICD-10-CM

## 2023-02-14 DIAGNOSIS — J449 Chronic obstructive pulmonary disease, unspecified: Secondary | ICD-10-CM | POA: Diagnosis not present

## 2023-02-15 ENCOUNTER — Encounter (INDEPENDENT_AMBULATORY_CARE_PROVIDER_SITE_OTHER): Payer: Self-pay | Admitting: Vascular Surgery

## 2023-03-24 ENCOUNTER — Other Ambulatory Visit: Payer: Self-pay | Admitting: Physician Assistant

## 2023-03-24 DIAGNOSIS — E782 Mixed hyperlipidemia: Secondary | ICD-10-CM

## 2023-03-24 DIAGNOSIS — J449 Chronic obstructive pulmonary disease, unspecified: Secondary | ICD-10-CM

## 2023-04-05 ENCOUNTER — Encounter: Payer: Self-pay | Admitting: Family Medicine

## 2023-04-05 ENCOUNTER — Other Ambulatory Visit: Payer: Self-pay

## 2023-04-05 ENCOUNTER — Ambulatory Visit (INDEPENDENT_AMBULATORY_CARE_PROVIDER_SITE_OTHER): Payer: Medicaid Other | Admitting: Family Medicine

## 2023-04-05 VITALS — BP 116/77 | HR 85 | Temp 97.9°F | Ht 62.5 in | Wt 194.0 lb

## 2023-04-05 DIAGNOSIS — H00025 Hordeolum internum left lower eyelid: Secondary | ICD-10-CM | POA: Diagnosis not present

## 2023-04-05 DIAGNOSIS — Z23 Encounter for immunization: Secondary | ICD-10-CM

## 2023-04-05 DIAGNOSIS — R21 Rash and other nonspecific skin eruption: Secondary | ICD-10-CM

## 2023-04-05 DIAGNOSIS — F17211 Nicotine dependence, cigarettes, in remission: Secondary | ICD-10-CM | POA: Diagnosis not present

## 2023-04-05 MED ORDER — TRIAMCINOLONE ACETONIDE 0.1 % EX OINT
1.0000 | TOPICAL_OINTMENT | Freq: Two times a day (BID) | CUTANEOUS | 1 refills | Status: AC
Start: 2023-04-05 — End: ?

## 2023-04-05 MED ORDER — AREXVY 120 MCG/0.5ML IM SUSR
0.5000 mL | Freq: Once | INTRAMUSCULAR | 0 refills | Status: AC
  Filled 2023-04-25: qty 0.5, 1d supply, fill #0

## 2023-04-05 NOTE — Progress Notes (Signed)
Established patient visit   Patient: Christy Mason   DOB: 1960/10/29   62 y.o. Female  MRN: [id number] Visit Date: 04/05/2023  Today's healthcare provider: Sherlyn Hay, DO   Chief Complaint  Patient presents with   Follow-up   Subjective    HPI Left eye - swollen Sunday morning sore, then swollen Sunday evening and Monday (improved now).  Lesions to anteriomedial shin, just proximal to ankle.  - has cortisone, cold tar from [employer], and neosporin but has not had any improvement. - has been there six months. Has improved some.  - no known injury  - previously looked really red and scaley looking  Having bad cramps in both legs (L>R) most nights; very painful  - has all but 1-2 nights per week.  - drinks water and tea  - loves salt, but tries not to use as much as she did.     Medications: Outpatient Medications Prior to Visit  Medication Sig   albuterol (PROVENTIL) (2.5 MG/3ML) 0.083% nebulizer solution USE 1 VIAL VIA NEBULIZER EVERY 4 HOURS AS NEEDED FOR WHEEZING OR SHORTNESS OF BREATH   albuterol (VENTOLIN HFA) 108 (90 Base) MCG/ACT inhaler TAKE 2 PUFFS BY MOUTH EVERY 6 HOURS AS NEEDED FOR WHEEZE OR SHORTNESS OF BREATH   aspirin EC 81 MG tablet Take 81 mg by mouth daily.   atorvastatin (LIPITOR) 20 MG tablet TAKE 1 TABLET BY MOUTH EVERY DAY   cetirizine (ZYRTEC) 10 MG tablet Take 10 mg by mouth daily.   cholecalciferol (VITAMIN D3) 25 MCG (1000 UNIT) tablet Take 1,000 Units by mouth daily.   Fluticasone-Umeclidin-Vilant (TRELEGY ELLIPTA) 200-62.5-25 MCG/ACT AEPB Inhale 1 puff into the lungs daily.   Fluticasone-Umeclidin-Vilant (TRELEGY ELLIPTA) 200-62.5-25 MCG/ACT AEPB Inhale 1 Act into the lungs daily.   Fluticasone-Umeclidin-Vilant (TRELEGY ELLIPTA) 200-62.5-25 MCG/ACT AEPB Inhale 1 puff into the lungs daily.   lidocaine (XYLOCAINE) 5 % ointment Apply 1 Application topically 2 (two) times daily as needed.   losartan (COZAAR) 50 MG tablet TAKE 1 TABLET BY  MOUTH EVERY DAY   Multiple Vitamin (MULTIVITAMIN WITH MINERALS) TABS tablet Take 1 tablet by mouth daily.   omeprazole (PRILOSEC) 20 MG capsule TAKE 1 CAPSULE BY MOUTH EVERY DAY   ondansetron (ZOFRAN) 4 MG tablet Take 1 tablet (4 mg total) by mouth every 6 (six) hours as needed for nausea.   SUBOXONE 8-2 MG FILM Take by mouth 2 (two) times daily.   triamcinolone (NASACORT) 55 MCG/ACT AERO nasal inhaler Place 1 spray into the nose daily as needed (allergies).   No facility-administered medications prior to visit.    Review of Systems  Constitutional:  Negative for appetite change, chills, fatigue and fever.  Respiratory:  Negative for chest tightness and shortness of breath.   Cardiovascular:  Negative for chest pain and palpitations.  Gastrointestinal:  Negative for abdominal pain, nausea and vomiting.  Neurological:  Positive for numbness. Negative for dizziness and weakness.        Objective    BP 116/77   Pulse 85   Temp 97.9 F (36.6 C)   Ht 5' 2.5" (1.588 m)   Wt 194 lb (88 kg)   SpO2 100%   BMI 34.92 kg/m     Physical Exam Vitals and nursing note reviewed.  Constitutional:      General: She is not in acute distress.    Appearance: Normal appearance.  HENT:     Head: Normocephalic and atraumatic.  Eyes:  General: No scleral icterus.    Conjunctiva/sclera: Conjunctivae normal.  Cardiovascular:     Rate and Rhythm: Normal rate.  Pulmonary:     Effort: Pulmonary effort is normal.  Skin:    Findings: Rash present.  Neurological:     Mental Status: She is alert and oriented to person, place, and time. Mental status is at baseline.  Psychiatric:        Mood and Affect: Mood normal.        Behavior: Behavior normal.      No results found for any visits on 04/05/23.  Assessment & Plan    Need for influenza vaccination -     Flu vaccine trivalent PF, 6mos and older(Flulaval,Afluria,Fluarix,Fluzone)  Hordeolum internum left lower eyelid Assessment &  Plan: Advised patient to use warm, moist compresses 3-4 times daily and wash hands anytime she touches her eyes and prior to touching them.  Given that her pain is largely focused around the lump of the hordeolum itself and does not appear to be swollen at this time, this is likely not preseptal cellulitis.  However, patient to contact clinic if her symptoms worsen and I will prescribe antibiotic at that time.   Rash -     Triamcinolone Acetonide; Apply 1 Application topically 2 (two) times daily.  Dispense: 30 g; Refill: 1  Nicotine dependence, cigarettes, in remission Assessment & Plan: 30 pack-year smoking history. Quit In 2021  Orders: -     Ambulatory Referral for Lung Cancer Scre   Return in about 3 weeks (around 04/26/2023).     There are other unrelated non-urgent complaints, but due to the busy schedule and the amount of time I've already spent with her, time does not permit me to address these routine issues at today's visit. I've requested another appointment to review these additional issues.  I discussed the assessment and treatment plan with the patient  The patient was provided an opportunity to ask questions and all were answered. The patient agreed with the plan and demonstrated an understanding of the instructions.   The patient was advised to call back or seek an in-person evaluation if the symptoms worsen or if the condition fails to improve as anticipated.    Sherlyn Hay, DO  [facility] [phone] (phone) [phone] (fax)  [facility]

## 2023-04-10 DIAGNOSIS — F17211 Nicotine dependence, cigarettes, in remission: Secondary | ICD-10-CM | POA: Insufficient documentation

## 2023-04-10 DIAGNOSIS — H00025 Hordeolum internum left lower eyelid: Secondary | ICD-10-CM | POA: Insufficient documentation

## 2023-04-10 NOTE — Assessment & Plan Note (Signed)
Advised patient to use warm, moist compresses 3-4 times daily and wash hands anytime she touches her eyes and prior to touching them.  Given that her pain is largely focused around the lump of the hordeolum itself and does not appear to be swollen at this time, this is likely not preseptal cellulitis.  However, patient to contact clinic if her symptoms worsen and I will prescribe antibiotic at that time.

## 2023-04-10 NOTE — Assessment & Plan Note (Signed)
30 pack-year smoking history. Quit In 2021

## 2023-04-11 ENCOUNTER — Other Ambulatory Visit: Payer: Self-pay | Admitting: Physician Assistant

## 2023-04-11 DIAGNOSIS — I10 Essential (primary) hypertension: Secondary | ICD-10-CM

## 2023-04-20 ENCOUNTER — Other Ambulatory Visit: Payer: Self-pay | Admitting: Family Medicine

## 2023-04-20 DIAGNOSIS — E782 Mixed hyperlipidemia: Secondary | ICD-10-CM

## 2023-04-20 NOTE — Telephone Encounter (Signed)
Requested Prescriptions  Pending Prescriptions Disp Refills   atorvastatin (LIPITOR) 20 MG tablet [Pharmacy Med Name: ATORVASTATIN 20 MG TABLET] 90 tablet 0    Sig: TAKE 1 TABLET BY MOUTH EVERY DAY     Cardiovascular:  Antilipid - Statins Failed - 04/20/2023  2:34 PM      Failed - Lipid Panel in normal range within the last 12 months    Cholesterol, Total  Date Value Ref Range Status  11/26/2021 127 100 - 199 mg/dL Final   LDL Chol Calc (NIH)  Date Value Ref Range Status  11/26/2021 58 0 - 99 mg/dL Final   HDL  Date Value Ref Range Status  11/26/2021 55 >39 mg/dL Final   Triglycerides  Date Value Ref Range Status  11/26/2021 65 0 - 149 mg/dL Final         Passed - Patient is not pregnant      Passed - Valid encounter within last 12 months    Recent Outpatient Visits           2 weeks ago Hordeolum internum left lower eyelid   [facility], Monico Blitz, DO   8 months ago Annual physical exam   [facility] [name], PA-C   1 year ago Essential hypertension   [facility] [name], PA-C   1 year ago Chronic obstructive pulmonary disease, unspecified COPD type [facility])   [facility] [name], PA-C   2 years ago Essential hypertension   [facility], Marzella Schlein, MD       Future Appointments             In 5 days [facility], Monico Blitz, DO [facility], [facility]

## 2023-04-25 ENCOUNTER — Ambulatory Visit (INDEPENDENT_AMBULATORY_CARE_PROVIDER_SITE_OTHER): Payer: Medicaid Other | Admitting: Family Medicine

## 2023-04-25 ENCOUNTER — Other Ambulatory Visit: Payer: Self-pay

## 2023-04-25 ENCOUNTER — Encounter: Payer: Self-pay | Admitting: Family Medicine

## 2023-04-25 VITALS — BP 119/79 | HR 66 | Temp 98.6°F | Resp 16 | Ht 62.0 in | Wt 192.4 lb

## 2023-04-25 DIAGNOSIS — R252 Cramp and spasm: Secondary | ICD-10-CM

## 2023-04-25 DIAGNOSIS — R21 Rash and other nonspecific skin eruption: Secondary | ICD-10-CM

## 2023-04-25 DIAGNOSIS — E0590 Thyrotoxicosis, unspecified without thyrotoxic crisis or storm: Secondary | ICD-10-CM | POA: Diagnosis not present

## 2023-04-25 DIAGNOSIS — E782 Mixed hyperlipidemia: Secondary | ICD-10-CM | POA: Diagnosis not present

## 2023-04-25 DIAGNOSIS — F17211 Nicotine dependence, cigarettes, in remission: Secondary | ICD-10-CM

## 2023-04-25 DIAGNOSIS — R7303 Prediabetes: Secondary | ICD-10-CM

## 2023-04-25 DIAGNOSIS — R195 Other fecal abnormalities: Secondary | ICD-10-CM

## 2023-04-25 MED ORDER — KETOCONAZOLE 2 % EX CREA: 1.0000 | TOPICAL_CREAM | Freq: Every day | CUTANEOUS | 0 refills | Status: DC

## 2023-04-25 NOTE — Assessment & Plan Note (Signed)
Discussed importance of following up on positive Cologuard test and that it is unlikely to have occurred in relation to constipation.  Patient was agreeable to referral for colonoscopy; sent as noted below.

## 2023-04-25 NOTE — Assessment & Plan Note (Signed)
Will evaluate for metabolic causes as noted below. Discussed increasing hydration and intake of electrolytes, for which the patient prefers to drink Body Armor beverages. May consider transitioning patient from atorvastatin to rosuvastatin if not improving any metabolic sources found.

## 2023-04-25 NOTE — Assessment & Plan Note (Signed)
Will check lipid panel today.  No acute concerns.

## 2023-04-25 NOTE — Assessment & Plan Note (Signed)
Will recheck TSH today. No acute concerns.

## 2023-04-25 NOTE — Assessment & Plan Note (Addendum)
Patient's rash has persisted and appears darker than previously, after trialing triamcinolone.  Will go ahead and try an antifungal.  Patient to notify clinic if this does not start improving we will consider a higher potency steroid cream at that point. Also recommended dermatology for possible biopsy.

## 2023-04-25 NOTE — Assessment & Plan Note (Signed)
Will recheck A1c today. No acute concerns.

## 2023-04-25 NOTE — Progress Notes (Signed)
Established patient visit   Patient: Christy Mason   DOB: 07/29/1960   62 y.o. Female  MRN: [id number] Visit Date: 04/25/2023  Today's healthcare provider: Sherlyn Hay, DO   Chief Complaint  Patient presents with   Follow-up    [place], recheck eye and right leg bruising   Subjective    HPI Cramping:  - causes inversion of feet (left worse the right)  - Drinks water and sweet tea, does not drink soda Trying to get more exercise (walking around more); goes twice per week.    Medications: Outpatient Medications Prior to Visit  Medication Sig   albuterol (PROVENTIL) (2.5 MG/3ML) 0.083% nebulizer solution USE 1 VIAL VIA NEBULIZER EVERY 4 HOURS AS NEEDED FOR WHEEZING OR SHORTNESS OF BREATH   albuterol (VENTOLIN HFA) 108 (90 Base) MCG/ACT inhaler TAKE 2 PUFFS BY MOUTH EVERY 6 HOURS AS NEEDED FOR WHEEZE OR SHORTNESS OF BREATH   aspirin EC 81 MG tablet Take 81 mg by mouth daily.   atorvastatin (LIPITOR) 20 MG tablet TAKE 1 TABLET BY MOUTH EVERY DAY   cetirizine (ZYRTEC) 10 MG tablet Take 10 mg by mouth daily.   cholecalciferol (VITAMIN D3) 25 MCG (1000 UNIT) tablet Take 1,000 Units by mouth daily.   Fluticasone-Umeclidin-Vilant (TRELEGY ELLIPTA) 200-62.5-25 MCG/ACT AEPB Inhale 1 puff into the lungs daily.   Fluticasone-Umeclidin-Vilant (TRELEGY ELLIPTA) 200-62.5-25 MCG/ACT AEPB Inhale 1 Act into the lungs daily.   Fluticasone-Umeclidin-Vilant (TRELEGY ELLIPTA) 200-62.5-25 MCG/ACT AEPB Inhale 1 puff into the lungs daily.   lidocaine (XYLOCAINE) 5 % ointment Apply 1 Application topically 2 (two) times daily as needed.   losartan (COZAAR) 50 MG tablet TAKE 1 TABLET BY MOUTH EVERY DAY   Multiple Vitamin (MULTIVITAMIN WITH MINERALS) TABS tablet Take 1 tablet by mouth daily.   omeprazole (PRILOSEC) 20 MG capsule TAKE 1 CAPSULE BY MOUTH EVERY DAY   ondansetron (ZOFRAN) 4 MG tablet Take 1 tablet (4 mg total) by mouth every 6 (six) hours as needed for nausea.   RSV vaccine recomb  adjuvanted (AREXVY) 120 MCG/0.5ML injection Inject 0.5 mLs into the muscle once for 1 dose.   SUBOXONE 8-2 MG FILM Take by mouth 2 (two) times daily.   triamcinolone (NASACORT) 55 MCG/ACT AERO nasal inhaler Place 1 spray into the nose daily as needed (allergies).   triamcinolone ointment (KENALOG) 0.1 % Apply 1 Application topically 2 (two) times daily.   No facility-administered medications prior to visit.    Review of Systems  Constitutional:  Negative for appetite change, chills, fatigue and fever.  Respiratory:  Negative for chest tightness and shortness of breath.   Cardiovascular:  Negative for chest pain and palpitations.  Gastrointestinal:  Negative for abdominal pain, nausea and vomiting.  Musculoskeletal:        +cramping of feet at night  Neurological:  Negative for dizziness and weakness.         Objective    BP 119/79 (BP Location: Right Arm, Patient Position: Sitting, Cuff Size: Normal)   Pulse 66   Temp 98.6 F (37 C)   Resp 16   Ht 5\' 2"  (1.575 m)   Wt 192 lb 6.4 oz (87.3 kg)   SpO2 94%   BMI 35.19 kg/m     Physical Exam Vitals and nursing note reviewed.  Constitutional:      General: She is not in acute distress.    Appearance: Normal appearance.  HENT:     Head: Normocephalic and atraumatic.  Eyes:  General: No scleral icterus.    Conjunctiva/sclera: Conjunctivae normal.  Cardiovascular:     Rate and Rhythm: Normal rate.  Pulmonary:     Effort: Pulmonary effort is normal.  Neurological:     Mental Status: She is alert and oriented to person, place, and time. Mental status is at baseline.  Psychiatric:        Mood and Affect: Mood normal.        Behavior: Behavior normal.       No results found for any visits on 04/25/23.  Assessment & Plan    Cramping of feet Assessment & Plan: Will evaluate for metabolic causes as noted below. Discussed increasing hydration and intake of electrolytes, for which the patient prefers to drink Body  Armor beverages. May consider transitioning patient from atorvastatin to rosuvastatin if not improving any metabolic sources found.  Orders: -     Comprehensive metabolic panel -     Magnesium -     Vitamin B12 -     VITAMIN D 25 Hydroxy (Vit-D Deficiency, Fractures) -     Vitamin B1  Rash Assessment & Plan: Patient's rash has persisted and appears darker than previously, after trialing triamcinolone.  Will go ahead and try an antifungal.  Patient to notify clinic if this does not start improving we will consider a higher potency steroid cream at that point. Also recommended dermatology for possible biopsy.  Orders: -     Ketoconazole; Apply 1 Application topically daily.  Dispense: 15 g; Refill: 0 -     Ambulatory referral to Dermatology  Hyperthyroidism Assessment & Plan: Will recheck TSH today. No acute concerns.    Orders: -     TSH Rfx on Abnormal to Free T4  Mixed hyperlipidemia Assessment & Plan: Will check lipid panel today.  No acute concerns.  Orders: -     Lipid panel  Prediabetes Assessment & Plan: Will recheck A1c today. No acute concerns.  Orders: -     Hemoglobin A1c  Positive colorectal cancer screening using Cologuard test Assessment & Plan: Discussed importance of following up on positive Cologuard test and that it is unlikely to have occurred in relation to constipation.  Patient was agreeable to referral for colonoscopy; sent as noted below.  Orders: -     Ambulatory referral to Gastroenterology  Nicotine dependence, cigarettes, in remission -     Ambulatory Referral for Lung Cancer Scre    Return in about 6 months (around 10/24/2023).      I discussed the assessment and treatment plan with the patient  The patient was provided an opportunity to ask questions and all were answered. The patient agreed with the plan and demonstrated an understanding of the instructions.   The patient was advised to call back or seek an in-person evaluation  if the symptoms worsen or if the condition fails to improve as anticipated.    Sherlyn Hay, DO  [facility] [phone] (phone) [phone] (fax)  [facility]

## 2023-04-28 LAB — LIPID PANEL
Chol/HDL Ratio: 2.4 {ratio} (ref 0.0–4.4)
Cholesterol, Total: 136 mg/dL (ref 100–199)
HDL: 57 mg/dL (ref >39)
LDL Chol Calc (NIH): 65 mg/dL (ref 0–99)
Triglycerides: 66 mg/dL (ref 0–149)
VLDL Cholesterol Cal: 14 mg/dL (ref 5–40)

## 2023-04-28 LAB — COMPREHENSIVE METABOLIC PANEL
ALT: 21 [IU]/L (ref 0–32)
AST: 29 [IU]/L (ref 0–40)
Albumin: 4 g/dL (ref 3.9–4.9)
Alkaline Phosphatase: 147 [IU]/L — ABNORMAL HIGH (ref 44–121)
BUN/Creatinine Ratio: 12 (ref 12–28)
BUN: 13 mg/dL (ref 8–27)
Bilirubin Total: 0.4 mg/dL (ref 0.0–1.2)
CO2: 23 mmol/L (ref 20–29)
Calcium: 10.3 mg/dL (ref 8.7–10.3)
Chloride: 103 mmol/L (ref 96–106)
Creatinine, Ser: 1.07 mg/dL — ABNORMAL HIGH (ref 0.57–1.00)
Globulin, Total: 3.3 g/dL (ref 1.5–4.5)
Glucose: 104 mg/dL — ABNORMAL HIGH (ref 70–99)
Potassium: 5.1 mmol/L (ref 3.5–5.2)
Sodium: 143 mmol/L (ref 134–144)
Total Protein: 7.3 g/dL (ref 6.0–8.5)
eGFR: 58 mL/min/{1.73_m2} — ABNORMAL LOW (ref >59)

## 2023-04-28 LAB — VITAMIN B12: Vitamin B-12: 600 pg/mL (ref 232–1245)

## 2023-04-28 LAB — HEMOGLOBIN A1C
Est. average glucose Bld gHb Est-mCnc: 134 mg/dL
Hgb A1c MFr Bld: 6.3 % — ABNORMAL HIGH (ref 4.8–5.6)

## 2023-04-28 LAB — VITAMIN B1: Thiamine: 141.9 nmol/L (ref 66.5–200.0)

## 2023-04-28 LAB — TSH RFX ON ABNORMAL TO FREE T4: TSH: 2.18 u[IU]/mL (ref 0.450–4.500)

## 2023-04-28 LAB — MAGNESIUM: Magnesium: 1.9 mg/dL (ref 1.6–2.3)

## 2023-04-28 LAB — VITAMIN D 25 HYDROXY (VIT D DEFICIENCY, FRACTURES): Vit D, 25-Hydroxy: 50.8 ng/mL (ref 30.0–100.0)

## 2023-05-02 ENCOUNTER — Encounter: Payer: Self-pay | Admitting: *Deleted

## 2023-05-19 ENCOUNTER — Other Ambulatory Visit: Payer: Self-pay | Admitting: Family Medicine

## 2023-05-19 DIAGNOSIS — I10 Essential (primary) hypertension: Secondary | ICD-10-CM

## 2023-05-20 NOTE — Telephone Encounter (Signed)
Requested Prescriptions  Pending Prescriptions Disp Refills   losartan (COZAAR) 50 MG tablet [Pharmacy Med Name: LOSARTAN POTASSIUM 50 MG TAB] 90 tablet 1    Sig: TAKE 1 TABLET BY MOUTH EVERY DAY     Cardiovascular:  Angiotensin Receptor Blockers Failed - 05/19/2023  9:31 AM      Failed - Cr in normal range and within 180 days    Creatinine  Date Value Ref Range Status  03/19/2014 0.86 0.60 - 1.30 mg/dL Final   Creatinine, Ser  Date Value Ref Range Status  04/25/2023 1.07 (H) 0.57 - 1.00 mg/dL Final         Passed - K in normal range and within 180 days    Potassium  Date Value Ref Range Status  04/25/2023 5.1 3.5 - 5.2 mmol/L Final  03/19/2014 3.0 (L) 3.5 - 5.1 mmol/L Final         Passed - Patient is not pregnant      Passed - Last BP in normal range    BP Readings from Last 1 Encounters:  04/25/23 119/79         Passed - Valid encounter within last 6 months    Recent Outpatient Visits           3 weeks ago Cramping of feet   [facility] [place], Monico Blitz, DO   1 month ago Hordeolum internum left lower eyelid   [facility] Sherlyn Hay, DO   9 months ago Annual physical exam   [facility] [name], PA-C   1 year ago Essential hypertension   [facility] [name], PA-C   1 year ago Chronic obstructive pulmonary disease, unspecified COPD type [facility])   [facility] [name], PA-C       Future Appointments             In 1 month Elie Goody, MD [facility]   In 5 months [facility], Monico Blitz, DO [facility], [facility]

## 2023-06-06 ENCOUNTER — Ambulatory Visit: Payer: Self-pay | Admitting: *Deleted

## 2023-06-06 NOTE — Telephone Encounter (Signed)
  Chief Complaint: coughing up green mucus, blowing yellow-bloody mucus from nose.  Has COPD Symptoms: above and body aches and sweating, SOB with exertion more than her usual.   Frequency: For over a week almost 2 weeks now Pertinent Negatives: Patient denies N/A Disposition: [] ED /[] Urgent Care (no appt availability in office) / [x] Appointment(In office/virtual)/ []  [facility]/ [] Home Care/ [] Refused Recommended Disposition /[] [facility]/ []  Follow-up with PCP Additional Notes: No appts at [facility] with any of the providers so made her an appt with float provider at [facility] with Daryel November, PA-C for 06/07/2023 at 2:20.    She verbalized understanding to go to [facility].   Location given.  Pt. Agreeable to this plan.
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
## 2023-06-07 ENCOUNTER — Encounter: Payer: Self-pay | Admitting: Physician Assistant

## 2023-06-07 ENCOUNTER — Ambulatory Visit (INDEPENDENT_AMBULATORY_CARE_PROVIDER_SITE_OTHER): Payer: Medicaid Other | Admitting: Physician Assistant

## 2023-06-07 VITALS — BP 118/76 | HR 90 | Temp 98.0°F | Resp 18 | Ht 62.0 in | Wt 196.6 lb

## 2023-06-07 DIAGNOSIS — J441 Chronic obstructive pulmonary disease with (acute) exacerbation: Secondary | ICD-10-CM | POA: Diagnosis not present

## 2023-06-07 MED ORDER — PREDNISONE 20 MG PO TABS: ORAL_TABLET | ORAL | 0 refills | Status: DC

## 2023-06-07 MED ORDER — AMOXICILLIN-POT CLAVULANATE 875-125 MG PO TABS
1.0000 | ORAL_TABLET | Freq: Two times a day (BID) | ORAL | 0 refills | Status: DC
Start: 2023-06-07 — End: 2023-10-24

## 2023-06-07 NOTE — Patient Instructions (Addendum)
You can try Lysine to help with prevention of cold sores - an over the counter supplement once per day should be sufficient to help with prevention   I have sent in a script for Prednisone taper to be taken in the morning with breakfast per the instructions on the container Remember that steroids can cause sleeplessness, irritability, increased hunger and elevated glucose levels so be mindful of these side effects. They should lessen as you progress to the lower doses of the taper.  Please continue to take the Mucinex and Nyquil/ Dayquil to assist with managing your symptoms while the antibiotic is kicking in

## 2023-06-07 NOTE — Progress Notes (Unsigned)
Acute Office Visit   Patient: Christy Mason   DOB: 1961-02-08   62 y.o. Female  MRN: [id number] Visit Date: 06/07/2023  Today's healthcare provider: Oswaldo Conroy Ayush Boulet, PA-C  Introduced myself to the patient as a Secondary school teacher and provided education on APPs in clinical practice.    Chief Complaint  Patient presents with   COPD   Cough    X2 week   Subjective    HPI HPI     Cough    Additional comments: X2 week      Last edited by Benay Pike, CMA on 06/07/2023  2:11 PM.      She reports she has had productive cough with yellow-green mucus, sinus congestion and rhinorrhea with blood and yellow mucus  She reports sore throat when she first gets up  She also has concerns for potential cold sores on her lips and mouth   Interventions: She has taken a BC and Nyquil, mucinex  Recent sick contacts: her sister had similar symptoms and was dx with "walking pneumonia"  She has not tested for COVID at home   She has been using her rescue inhaler 1-2 times per day since she started having symptoms She reports this is an increase from her normal use  She is using Trelegy every day  Medications: Outpatient Medications Prior to Visit  Medication Sig   albuterol (PROVENTIL) (2.5 MG/3ML) 0.083% nebulizer solution USE 1 VIAL VIA NEBULIZER EVERY 4 HOURS AS NEEDED FOR WHEEZING OR SHORTNESS OF BREATH   albuterol (VENTOLIN HFA) 108 (90 Base) MCG/ACT inhaler TAKE 2 PUFFS BY MOUTH EVERY 6 HOURS AS NEEDED FOR WHEEZE OR SHORTNESS OF BREATH   aspirin EC 81 MG tablet Take 81 mg by mouth daily.   atorvastatin (LIPITOR) 20 MG tablet TAKE 1 TABLET BY MOUTH EVERY DAY   cetirizine (ZYRTEC) 10 MG tablet Take 10 mg by mouth daily.   cholecalciferol (VITAMIN D3) 25 MCG (1000 UNIT) tablet Take 1,000 Units by mouth daily.   Fluticasone-Umeclidin-Vilant (TRELEGY ELLIPTA) 200-62.5-25 MCG/ACT AEPB Inhale 1 puff into the lungs daily.   Fluticasone-Umeclidin-Vilant (TRELEGY ELLIPTA) 200-62.5-25 MCG/ACT AEPB  Inhale 1 Act into the lungs daily.   Fluticasone-Umeclidin-Vilant (TRELEGY ELLIPTA) 200-62.5-25 MCG/ACT AEPB Inhale 1 puff into the lungs daily.   ketoconazole (NIZORAL) 2 % cream Apply 1 Application topically daily.   lidocaine (XYLOCAINE) 5 % ointment Apply 1 Application topically 2 (two) times daily as needed.   losartan (COZAAR) 50 MG tablet TAKE 1 TABLET BY MOUTH EVERY DAY   Multiple Vitamin (MULTIVITAMIN WITH MINERALS) TABS tablet Take 1 tablet by mouth daily.   omeprazole (PRILOSEC) 20 MG capsule TAKE 1 CAPSULE BY MOUTH EVERY DAY   triamcinolone ointment (KENALOG) 0.1 % Apply 1 Application topically 2 (two) times daily.   ondansetron (ZOFRAN) 4 MG tablet Take 1 tablet (4 mg total) by mouth every 6 (six) hours as needed for nausea. (Patient not taking: Reported on 06/07/2023)   SUBOXONE 8-2 MG FILM Take by mouth 2 (two) times daily. (Patient not taking: Reported on 06/07/2023)   triamcinolone (NASACORT) 55 MCG/ACT AERO nasal inhaler Place 1 spray into the nose daily as needed (allergies). (Patient not taking: Reported on 06/07/2023)   No facility-administered medications prior to visit.    Review of Systems  Constitutional:  Positive for chills, diaphoresis and fatigue. Negative for fever.  HENT:  Positive for congestion, postnasal drip, rhinorrhea, sinus pressure, sinus pain and sore throat. Negative for ear  pain.   Respiratory:  Positive for cough and shortness of breath (on exertion).   Gastrointestinal:  Negative for diarrhea, nausea and vomiting.  Musculoskeletal:  Positive for myalgias.  Neurological:  Negative for dizziness, light-headedness and headaches.    {Insert previous labs (optional):23779} {See past labs  Heme  Chem  Endocrine  Serology  Results Review (optional):1}   Objective    BP 118/76 (BP Location: Right Arm, Patient Position: Sitting, Cuff Size: Large)   Pulse 90   Temp 98 F (36.7 C) (Oral)   Resp 18   Ht 5\' 2"  (1.575 m) Comment: per patient  Wt  196 lb 9.6 oz (89.2 kg)   SpO2 95%   BMI 35.96 kg/m  {Insert last BP/Wt (optional):23777}{See vitals history (optional):1}   Physical Exam Vitals reviewed.  Constitutional:      General: She is awake.     Appearance: Normal appearance. She is well-developed and well-groomed.  HENT:     Head: Normocephalic and atraumatic.     Right Ear: Hearing, tympanic membrane and ear canal normal.     Left Ear: Hearing, tympanic membrane and ear canal normal.     Mouth/Throat:     Lips: Pink.     Mouth: Mucous membranes are moist.     Pharynx: Uvula midline. Posterior oropharyngeal erythema and postnasal drip present. No oropharyngeal exudate or uvula swelling.  Eyes:     General: Lids are normal. Gaze aligned appropriately.     Extraocular Movements: Extraocular movements intact.     Conjunctiva/sclera: Conjunctivae normal.  Cardiovascular:     Rate and Rhythm: Normal rate and regular rhythm.     Pulses: Normal pulses.          Radial pulses are 2+ on the right side and 2+ on the left side.     Heart sounds: Normal heart sounds. No murmur heard.    No friction rub. No gallop.  Pulmonary:     Effort: Pulmonary effort is normal.     Breath sounds: Decreased air movement present. Decreased breath sounds present. No wheezing, rhonchi or rales.  Lymphadenopathy:     Head:     Right side of head: No submental, submandibular or preauricular adenopathy.     Left side of head: No submental, submandibular or preauricular adenopathy.     Cervical:     Right cervical: No superficial cervical adenopathy.    Left cervical: No superficial cervical adenopathy.     Upper Body:     Right upper body: No supraclavicular adenopathy.     Left upper body: No supraclavicular adenopathy.     Comments: Mass along right supraclavicular area - patient reports stent in that area   Neurological:     Mental Status: She is alert.  Psychiatric:        Attention and Perception: Attention normal.        Mood and  Affect: Mood normal.        Speech: Speech normal.        Behavior: Behavior normal. Behavior is cooperative.       No results found for any visits on 06/07/23.  Assessment & Plan      No follow-ups on file.      Problem List Items Addressed This Visit       Respiratory   COPD (chronic obstructive pulmonary disease) (HCC) - Primary   Relevant Medications   amoxicillin-clavulanate (AUGMENTIN) 875-125 MG tablet   predniSONE (DELTASONE) 20 MG tablet  No follow-ups on file.   I, Tylek Boney E Romanda Turrubiates, PA-C, have reviewed all documentation for this visit. The documentation on 06/07/23 for the exam, diagnosis, procedures, and orders are all accurate and complete.   Jacquelin Hawking, MHS, PA-C [facility]

## 2023-06-08 NOTE — Assessment & Plan Note (Addendum)
Chronic, historic condition Appears exacerbated today -likely secondary to URI  She reports ongoing symptoms for about 2 weeks that have not improved with home measures  Will start Augmentin 875-125 mg PO BID x 10 days, Prednisone taper x7 days to assist with current symptoms Reviewed ED and return precautions Continue inhalers as directed Recommend she continues with home measures for added symptomatic control  Follow up as needed for persistent or progressing symptoms

## 2023-06-15 ENCOUNTER — Other Ambulatory Visit: Payer: Self-pay | Admitting: Family Medicine

## 2023-06-15 DIAGNOSIS — J449 Chronic obstructive pulmonary disease, unspecified: Secondary | ICD-10-CM

## 2023-06-23 ENCOUNTER — Ambulatory Visit: Payer: Medicaid Other | Admitting: Dermatology

## 2023-07-19 ENCOUNTER — Other Ambulatory Visit: Payer: Self-pay | Admitting: Family Medicine

## 2023-07-19 DIAGNOSIS — E782 Mixed hyperlipidemia: Secondary | ICD-10-CM

## 2023-07-19 DIAGNOSIS — K219 Gastro-esophageal reflux disease without esophagitis: Secondary | ICD-10-CM

## 2023-07-27 DIAGNOSIS — G43109 Migraine with aura, not intractable, without status migrainosus: Secondary | ICD-10-CM | POA: Diagnosis not present

## 2023-09-20 ENCOUNTER — Other Ambulatory Visit (HOSPITAL_COMMUNITY): Payer: Self-pay

## 2023-09-20 ENCOUNTER — Telehealth: Payer: Self-pay

## 2023-09-20 NOTE — Telephone Encounter (Signed)
*  Pulm  Pharmacy Patient Advocate Encounter   Received notification from CoverMyMeds that prior authorization for Trelegy Ellipta 200-62.5-25MCG/ACT aerosol powder  is required/requested.   Insurance verification completed.   The patient is insured through [employer] .   Per test claim: PA required; PA submitted to above mentioned insurance via CoverMyMeds Key/confirmation #/EOC [id number] Status is pending   *this is a renewal request

## 2023-09-21 ENCOUNTER — Other Ambulatory Visit (HOSPITAL_COMMUNITY): Payer: Self-pay

## 2023-09-21 NOTE — Telephone Encounter (Signed)
 Pharmacy Patient Advocate Encounter  Received notification from [facility] that Prior Authorization for Trelegy Ellipta 200-62.5-25MCG/ACT aerosol powder  has been APPROVED from 09/20/2023 to 09/19/2024. Unable to obtain price due to refill too soon rejection, last fill date 09/08/2023 next available fill date03/22/2025

## 2023-09-26 ENCOUNTER — Other Ambulatory Visit: Payer: Self-pay

## 2023-09-26 ENCOUNTER — Telehealth: Payer: Self-pay | Admitting: Family Medicine

## 2023-09-26 DIAGNOSIS — J449 Chronic obstructive pulmonary disease, unspecified: Secondary | ICD-10-CM

## 2023-09-26 NOTE — Telephone Encounter (Signed)
 [facility] pharmacy is requesting refill VENTOLIN HFA 108 (90 Base) MCG/ACT inhaler  Please advise

## 2023-09-26 NOTE — Telephone Encounter (Signed)
 This has not been prescribed since 2023

## 2023-09-27 MED ORDER — ALBUTEROL SULFATE (2.5 MG/3ML) 0.083% IN NEBU: INHALATION_SOLUTION | RESPIRATORY_TRACT | 6 refills | Status: AC

## 2023-09-28 ENCOUNTER — Other Ambulatory Visit: Payer: Self-pay | Admitting: Family Medicine

## 2023-09-28 ENCOUNTER — Telehealth: Payer: Self-pay | Admitting: Family Medicine

## 2023-09-28 DIAGNOSIS — J449 Chronic obstructive pulmonary disease, unspecified: Secondary | ICD-10-CM

## 2023-09-28 MED ORDER — ALBUTEROL SULFATE HFA 108 (90 BASE) MCG/ACT IN AERS: 2.0000 | INHALATION_SPRAY | Freq: Four times a day (QID) | RESPIRATORY_TRACT | 1 refills | Status: DC | PRN

## 2023-09-28 NOTE — Telephone Encounter (Signed)
 [facility] pharmacy is requesting refill VENTOLIN HFA 108 (90 Base) MCG/ACT inhaler  Please advise

## 2023-10-04 ENCOUNTER — Telehealth: Payer: Self-pay | Admitting: *Deleted

## 2023-10-04 ENCOUNTER — Other Ambulatory Visit: Payer: Self-pay | Admitting: *Deleted

## 2023-10-04 DIAGNOSIS — Z122 Encounter for screening for malignant neoplasm of respiratory organs: Secondary | ICD-10-CM

## 2023-10-04 DIAGNOSIS — Z87891 Personal history of nicotine dependence: Secondary | ICD-10-CM

## 2023-10-04 NOTE — Telephone Encounter (Signed)
 Lung Cancer Screening Narrative/Criteria Questionnaire (Cigarette Smokers Only- No Cigars/Pipes/vapes)   Christy Mason   SDMV:11/01/23 12:30- [name]                                           1960-09-08              LDCT: 11/02/23 1:30- [facility]    62 y.o.   Phone: [phone]  Lung Screening Narrative (confirm age 51-77 yrs Medicare / 50-80 yrs Private pay insurance)   Insurance information:[employer] MCD   Referring Provider:Payne   This screening involves an initial phone call with a team member from our program. It is called a shared decision making visit. The initial meeting is required by insurance and Medicare to make sure you understand the program. This appointment takes about 15-20 minutes to complete. The CT scan will completed at a separate date/time. This scan takes about 5-10 minutes to complete and you may eat and drink before and after the scan.  Criteria questions for Lung Cancer Screening:   Are you a current or former smoker? Former Age began smoking: 14   If you are a former smoker, what year did you quit smoking? 2021 (within 15 yrs)   To calculate your smoking history, I need an accurate estimate of how many packs of cigarettes you smoked per day and for how many years. (Not just the number of PPD you are now smoking)   Years smoking 44 x Packs per day 1.5 = Pack years 64   (at least 20 pack yrs)   (Make sure they understand that we need to know how much they have smoked in the past, not just the number of PPD they are smoking now)  Do you have a personal history of cancer?  No    Do you have a family history of cancer? Yes  (cancer type and and relative) aunt (Breast)  Are you coughing up blood?  No  Have you had unexplained weight loss of 15 lbs or more in the last 6 months? No  It looks like you meet all criteria.     Additional information: N/A

## 2023-10-07 ENCOUNTER — Other Ambulatory Visit: Payer: Self-pay | Admitting: Internal Medicine

## 2023-10-07 DIAGNOSIS — J449 Chronic obstructive pulmonary disease, unspecified: Secondary | ICD-10-CM

## 2023-10-10 ENCOUNTER — Ambulatory Visit: Payer: Medicaid Other | Admitting: Internal Medicine

## 2023-10-15 ENCOUNTER — Other Ambulatory Visit: Payer: Self-pay | Admitting: Family Medicine

## 2023-10-15 DIAGNOSIS — E782 Mixed hyperlipidemia: Secondary | ICD-10-CM

## 2023-10-17 NOTE — Telephone Encounter (Signed)
 Requested Prescriptions  Pending Prescriptions Disp Refills   atorvastatin (LIPITOR) 20 MG tablet [Pharmacy Med Name: ATORVASTATIN 20 MG TABLET] 90 tablet 0    Sig: TAKE 1 TABLET BY MOUTH EVERY DAY     Cardiovascular:  Antilipid - Statins Failed - 10/17/2023 11:26 AM      Failed - Valid encounter within last 12 months    Recent Outpatient Visits   None     Future Appointments             In 1 week Pardue, Monico Blitz, DO [facility]            Failed - Lipid Panel in normal range within the last 12 months    Cholesterol, Total  Date Value Ref Range Status  04/25/2023 136 100 - 199 mg/dL Final   LDL Chol Calc (NIH)  Date Value Ref Range Status  04/25/2023 65 0 - 99 mg/dL Final   HDL  Date Value Ref Range Status  04/25/2023 57 >39 mg/dL Final   Triglycerides  Date Value Ref Range Status  04/25/2023 66 0 - 149 mg/dL Final         Passed - Patient is not pregnant

## 2023-10-24 ENCOUNTER — Ambulatory Visit: Payer: Self-pay | Admitting: Family Medicine

## 2023-10-24 ENCOUNTER — Telehealth: Payer: Self-pay

## 2023-10-24 ENCOUNTER — Other Ambulatory Visit (HOSPITAL_COMMUNITY): Payer: Self-pay

## 2023-10-24 ENCOUNTER — Encounter: Payer: Self-pay | Admitting: Family Medicine

## 2023-10-24 VITALS — BP 135/69 | HR 73 | Temp 99.1°F | Resp 16 | Ht 63.0 in | Wt 200.6 lb

## 2023-10-24 DIAGNOSIS — R21 Rash and other nonspecific skin eruption: Secondary | ICD-10-CM

## 2023-10-24 DIAGNOSIS — I771 Stricture of artery: Secondary | ICD-10-CM

## 2023-10-24 DIAGNOSIS — Z6835 Body mass index (BMI) 35.0-35.9, adult: Secondary | ICD-10-CM | POA: Diagnosis not present

## 2023-10-24 DIAGNOSIS — J302 Other seasonal allergic rhinitis: Secondary | ICD-10-CM

## 2023-10-24 DIAGNOSIS — D692 Other nonthrombocytopenic purpura: Secondary | ICD-10-CM

## 2023-10-24 DIAGNOSIS — N182 Chronic kidney disease, stage 2 (mild): Secondary | ICD-10-CM | POA: Diagnosis not present

## 2023-10-24 DIAGNOSIS — I10 Essential (primary) hypertension: Secondary | ICD-10-CM | POA: Diagnosis not present

## 2023-10-24 DIAGNOSIS — E782 Mixed hyperlipidemia: Secondary | ICD-10-CM | POA: Diagnosis not present

## 2023-10-24 DIAGNOSIS — R7303 Prediabetes: Secondary | ICD-10-CM | POA: Diagnosis not present

## 2023-10-24 DIAGNOSIS — J3089 Other allergic rhinitis: Secondary | ICD-10-CM

## 2023-10-24 DIAGNOSIS — J441 Chronic obstructive pulmonary disease with (acute) exacerbation: Secondary | ICD-10-CM | POA: Diagnosis not present

## 2023-10-24 DIAGNOSIS — R748 Abnormal levels of other serum enzymes: Secondary | ICD-10-CM

## 2023-10-24 DIAGNOSIS — Z09 Encounter for follow-up examination after completed treatment for conditions other than malignant neoplasm: Secondary | ICD-10-CM

## 2023-10-24 DIAGNOSIS — Z713 Dietary counseling and surveillance: Secondary | ICD-10-CM | POA: Diagnosis not present

## 2023-10-24 DIAGNOSIS — I739 Peripheral vascular disease, unspecified: Secondary | ICD-10-CM

## 2023-10-24 MED ORDER — ATORVASTATIN CALCIUM 20 MG PO TABS: 20.0000 mg | ORAL_TABLET | Freq: Every day | ORAL | 0 refills | Status: DC

## 2023-10-24 MED ORDER — MOMETASONE FUROATE 50 MCG/ACT NA SUSP: 2.0000 | Freq: Every day | NASAL | 12 refills | Status: AC

## 2023-10-24 MED ORDER — AZITHROMYCIN 250 MG PO TABS: ORAL_TABLET | ORAL | 0 refills | Status: AC

## 2023-10-24 MED ORDER — SEMAGLUTIDE-WEIGHT MANAGEMENT 0.25 MG/0.5ML ~~LOC~~ SOAJ: 0.2500 mg | SUBCUTANEOUS | 0 refills | Status: AC

## 2023-10-24 MED ORDER — SEMAGLUTIDE-WEIGHT MANAGEMENT 0.5 MG/0.5ML ~~LOC~~ SOAJ
0.5000 mg | SUBCUTANEOUS | 0 refills | Status: AC
Start: 2023-11-22 — End: 2023-12-20

## 2023-10-24 MED ORDER — LOSARTAN POTASSIUM 50 MG PO TABS: ORAL_TABLET | ORAL | 1 refills | Status: DC

## 2023-10-24 MED ORDER — DESONIDE 0.05 % EX CREA: TOPICAL_CREAM | Freq: Two times a day (BID) | CUTANEOUS | 0 refills | Status: AC

## 2023-10-24 MED ORDER — TRIAMCINOLONE ACETONIDE 55 MCG/ACT NA AERO: 1.0000 | INHALATION_SPRAY | Freq: Every day | NASAL | 5 refills | Status: AC | PRN

## 2023-10-24 MED ORDER — KETOCONAZOLE 2 % EX CREA
1.0000 | TOPICAL_CREAM | Freq: Every day | CUTANEOUS | 0 refills | Status: AC
Start: 2023-10-24 — End: ?

## 2023-10-24 NOTE — Telephone Encounter (Signed)
 Pharmacy Patient Advocate Encounter   Received notification from Onbase that prior authorization for [facility] 0.25MG /0.5ML auto-injectors is required/requested.   Insurance verification completed.   The patient is insured through [facility] MEDICAID .   Per test claim: PA required; PA submitted to above mentioned insurance via CoverMyMeds Key/confirmation #/EOC  [facility] Status is pending

## 2023-10-24 NOTE — Progress Notes (Addendum)
 Established patient visit   Patient: Christy Mason   DOB: 1961/07/10   63 y.o. Female  MRN: [id number] Visit Date: 10/24/2023  Today's healthcare provider: LAURAINE LOISE BUOY, DO   Chief Complaint  Patient presents with   Follow-up    6 mth f/u.. coughing with Phlegm since Weds morning    Subjective    HPI Christy Mason is a 63 year old female with COPD who presents with persistent cough and nasal congestion.  She has been experiencing a persistent cough and nasal congestion since Wednesday, accompanied by green nasal discharge and sputum. She uses Mucinex during the day and Delsym  at night but continues to wake up coughing. There has been no improvement in symptoms since onset.  She has a history of COPD and uses Trelegy daily. Since becoming ill, she has increased her use of the albuterol  inhaler to once or twice a day, with two puffs each time. She occasionally uses a nebulizer, especially when sick, and alternates between Nasacort  and Nasonex  for nasal symptoms. She avoids Flonase  due to epistaxis.  She experiences chronic nasal congestion, likely due to allergies, and alternates between Nasacort  and Nasonex . Cost is a concern for her medications.  She experiences constipation and has been using mineral oil. She consumes canned mandarin oranges, fresh strawberries, blueberries, lettuce, green beans, and turnip greens. She has tried Metamucil and Miralax  in the past without success.  She has a history of borderline blood sugar levels and finds it difficult to lose weight due to her COPD. She believes weight loss might improve her breathing.  She has been trying to lose weight by increasing her activity.  She does chair exercises and leg lifts.  She also carries her 22-year-old grandson as much as she can tolerate.  She is unable to do higher intensity exercises due to her COPD.  She has been avoiding sweets and is trying to eat more fruits and vegetables, in particular she has  increased her intake of asparagus and broccoli.  She notes that she loves vegetables and would happily eat a vegetable plate over a plate containing a veggie and a meat.  She is due for a colonoscopy due to her recent positive cologuard test and is due for breast cancer screening. She has a history of constipation. She has scheduled a lung cancer screening (11/02/2023) and received pneumonia, flu, and RSV vaccinations.      Medications: Outpatient Medications Prior to Visit  Medication Sig   albuterol  (PROVENTIL ) (2.5 MG/3ML) 0.083% nebulizer solution USE 1 VIAL VIA NEBULIZER EVERY 4 HOURS AS NEEDED FOR WHEEZING OR SHORTNESS OF BREATH   albuterol  (VENTOLIN  HFA) 108 (90 Base) MCG/ACT inhaler Inhale 2 puffs into the lungs every 6 (six) hours as needed for wheezing or shortness of breath.   aspirin  EC 81 MG tablet Take 81 mg by mouth daily.   cetirizine (ZYRTEC) 10 MG tablet Take 10 mg by mouth daily.   cholecalciferol  (VITAMIN D3) 25 MCG (1000 UNIT) tablet Take 1,000 Units by mouth daily.   lidocaine  (XYLOCAINE ) 5 % ointment Apply 1 Application topically 2 (two) times daily as needed.   Multiple Vitamin (MULTIVITAMIN WITH MINERALS) TABS tablet Take 1 tablet by mouth daily.   ondansetron  (ZOFRAN ) 4 MG tablet Take 1 tablet (4 mg total) by mouth every 6 (six) hours as needed for nausea.   SUBOXONE  8-2 MG FILM Take by mouth 2 (two) times daily.   TRELEGY ELLIPTA  200-62.5-25 MCG/ACT AEPB INHALE 1 PUFF  INTO THE LUNGS EVERY DAY   triamcinolone  ointment (KENALOG ) 0.1 % Apply 1 Application topically 2 (two) times daily.   [DISCONTINUED] amoxicillin -clavulanate (AUGMENTIN ) 875-125 MG tablet Take 1 tablet by mouth 2 (two) times daily.   [DISCONTINUED] atorvastatin  (LIPITOR) 20 MG tablet TAKE 1 TABLET BY MOUTH EVERY DAY   [DISCONTINUED] ketoconazole  (NIZORAL ) 2 % cream Apply 1 Application topically daily.   [DISCONTINUED] losartan  (COZAAR ) 50 MG tablet TAKE 1 TABLET BY MOUTH EVERY DAY   [DISCONTINUED]  omeprazole  (PRILOSEC) 20 MG capsule TAKE 1 CAPSULE BY MOUTH EVERY DAY   [DISCONTINUED] predniSONE  (DELTASONE ) 20 MG tablet Take 60mg  PO daily x 2 days, then40mg  PO daily x 2 days, then 20mg  PO daily x 3 days   [DISCONTINUED] triamcinolone  (NASACORT ) 55 MCG/ACT AERO nasal inhaler Place 1 spray into the nose daily as needed (allergies).   No facility-administered medications prior to visit.        Objective    BP 135/69 (BP Location: Left Arm, Patient Position: Sitting)   Pulse 73   Temp 99.1 F (37.3 C) (Oral)   Resp 16   Ht 5' 3 (1.6 m)   Wt 200 lb 9.6 oz (91 kg)   SpO2 97%   BMI 35.53 kg/m     Physical Exam Vitals and nursing note reviewed.  Constitutional:      General: She is not in acute distress.    Appearance: Normal appearance.  HENT:     Head: Normocephalic and atraumatic.  Eyes:     General: No scleral icterus.    Conjunctiva/sclera: Conjunctivae normal.  Cardiovascular:     Rate and Rhythm: Normal rate.  Pulmonary:     Effort: Pulmonary effort is normal.  Neurological:     Mental Status: She is alert and oriented to person, place, and time. Mental status is at baseline.  Psychiatric:        Mood and Affect: Mood normal.        Behavior: Behavior normal.     Results for orders placed or performed in visit on 10/24/23  Microalbumin / creatinine urine ratio  Result Value Ref Range   Creatinine, Urine 306.4 Not Estab. mg/dL   Microalbumin, Urine 76.9 Not Estab. ug/mL   Microalb/Creat Ratio 8 0 - 29 mg/g creat  Comprehensive metabolic panel with GFR  Result Value Ref Range   Glucose 93 70 - 99 mg/dL   BUN 13 8 - 27 mg/dL   Creatinine, Ser 8.95 (H) 0.57 - 1.00 mg/dL   eGFR 60 >40 fO/fpw/8.26   BUN/Creatinine Ratio 13 12 - 28   Sodium 142 134 - 144 mmol/L   Potassium 5.1 3.5 - 5.2 mmol/L   Chloride 104 96 - 106 mmol/L   CO2 24 20 - 29 mmol/L   Calcium  9.7 8.7 - 10.3 mg/dL   Total Protein 6.8 6.0 - 8.5 g/dL   Albumin 3.8 (L) 3.9 - 4.9 g/dL    Globulin, Total 3.0 1.5 - 4.5 g/dL   Bilirubin Total 0.3 0.0 - 1.2 mg/dL   Alkaline Phosphatase 151 (H) 44 - 121 IU/L   AST 29 0 - 40 IU/L   ALT 19 0 - 32 IU/L  Hemoglobin A1c  Result Value Ref Range   Hgb A1c MFr Bld 6.5 (H) 4.8 - 5.6 %   Est. average glucose Bld gHb Est-mCnc 140 mg/dL  Lipid panel  Result Value Ref Range   Cholesterol, Total 147 100 - 199 mg/dL   Triglycerides 68 0 - 149 mg/dL  HDL 60 >39 mg/dL   VLDL Cholesterol Cal 14 5 - 40 mg/dL   LDL Chol Calc (NIH) 73 0 - 99 mg/dL   Chol/HDL Ratio 2.5 0.0 - 4.4 ratio  Gamma GT  Result Value Ref Range   GGT 14 0 - 60 IU/L    Assessment & Plan    Follow-up exam, 3-6 months since previous exam  Essential hypertension -     Losartan  Potassium; TAKE 1 TABLET BY MOUTH EVERY DAY  Dispense: 90 tablet; Refill: 1  Mixed hyperlipidemia -     Atorvastatin  Calcium ; Take 1 tablet (20 mg total) by mouth daily.  Dispense: 90 tablet; Refill: 0 -     Lipid panel  Perennial allergic rhinitis with seasonal variation -     Triamcinolone  Acetonide; Place 1 spray into the nose daily as needed (allergies).  Dispense: 1 each; Refill: 5 -     Mometasone  Furoate; Place 2 sprays into the nose daily.  Dispense: 1 each; Refill: 12  Chronic kidney disease, stage 2, mildly decreased GFR -     Microalbumin / creatinine urine ratio -     Comprehensive metabolic panel with GFR  Prediabetes -     Hemoglobin A1c  Rash -     Ketoconazole ; Apply 1 Application topically daily.  Dispense: 15 g; Refill: 0 -     Desonide ; Apply topically 2 (two) times daily.  Dispense: 30 g; Refill: 0  Severe obesity (BMI 35.0-35.9 with comorbidity) (HCC) -     Semaglutide -Weight Management; Inject 0.25 mg into the skin once a week for 28 days.  Dispense: 2 mL; Refill: 0 -     Semaglutide -Weight Management; Inject 0.5 mg into the skin once a week for 28 days.  Dispense: 2 mL; Refill: 0  Weight loss counseling, encounter for -     Semaglutide -Weight Management;  Inject 0.25 mg into the skin once a week for 28 days.  Dispense: 2 mL; Refill: 0 -     Semaglutide -Weight Management; Inject 0.5 mg into the skin once a week for 28 days.  Dispense: 2 mL; Refill: 0  Chronic obstructive pulmonary disease with acute exacerbation (HCC) -     Azithromycin ; Take 2 tablets on day 1, then 1 tablet daily on days 2 through 5  Dispense: 6 tablet; Refill: 0  Subclavian arterial stenosis (HCC)  Senile purpura (HCC)  Elevated alkaline phosphatase level -     Gamma GT     Essential hypertension Chronic, stable.  Continue losartan .  Hyperlipidemia Chronic, stable.  Continue atorvastatin .  Recheck lipid panel today.  COPD with acute exacberation Persistent cough and nasal congestion with green sputum. Slight wheeze noted.  Increased albuterol  use since illness onset. Nebulizer use encouraged to ensure adequate air movement. - Encourage nebulizer use during illness. - Prescribe Z-Pak (azithromycin ). - Continue Trelegy daily.  Peripheral arterial disease with history of revascularization; subclavian arterial stenosis; weight loss counseling; severe obesity (BMI 35.0-35.9 with comorbidity); prediabetes Patient is morbidly obese with comorbidities of peripheral arterial disease with history of revascularization, subclavian arterial stenosis, hypertension, hyperlipidemia, COPD and CKD 2.  Prediabetes also present with A1c of 6.3. Discussed Wegovy  for reduction of progression of peripheral arterial disease, weight loss and blood sugar control. She has been working on increasing her activity over the past 6 months by doing chair exercises and likely as well as caring her grandson.  Her activity is limited by her COPD.  She also has focused on avoiding sweets and increasing her intake of  fruits and vegetables to reduce caloric intake over the past 6 months. - Prescribe Wegovy  for weight loss and blood sugar control. - Continue increasing activity reducing caloric intake, with  goal of 1400 cal/day  Chronic kidney disease stage II Noted. Continue to optimize blood pressure, cholesterol and blood sugar.  Perennial allergic rhinitis with seasonal variation Alternates between Nasacort  and Nasonex . Cost concerns for medications. - Check Medicaid coverage for Nasacort  and Nasonex .  Recurrent Skin Rash Rash responded to triamcinolone  and ketoconazole . Desonide  cream may be covered by Medicaid. - Prescribe desonide  cream. - Check Medicaid coverage for desonide  cream.  Constipation Chronic constipation. Recommended Miralax  or Colace. Encouraged increased dietary fiber intake and hydration. - Discontinue mineral oil. - Recommend Miralax  or Colace. - Encourage increased dietary fiber intake.  Subclavian arterial stenosis Continues to follow with vascular surgery; defer to specialist management  Senile purpura Noted.  No acute concerns.  General Health Maintenance Due for colonoscopy and breast cancer screening. History of positive colon test and chronic constipation. - Encourage scheduling of colonoscopy. - Encourage scheduling of breast cancer screening.   Follow-up Lung cancer screening scheduled for next Wednesday. - Attend lung cancer screening next Wednesday. - Follow up with lung doctor before screening.  Return in about 6 months (around 04/24/2024) for chronic f/u.      I discussed the assessment and treatment plan with the patient  The patient was provided an opportunity to ask questions and all were answered. The patient agreed with the plan and demonstrated an understanding of the instructions.   The patient was advised to call back or seek an in-person evaluation if the symptoms worsen or if the condition fails to improve as anticipated.    LAURAINE LOISE BUOY, DO  [facility] [phone] (phone) [phone] (fax)  [facility]

## 2023-10-24 NOTE — Patient Instructions (Addendum)
 [facility] [address], [place], [place] [address] [phone]

## 2023-10-25 ENCOUNTER — Telehealth: Payer: Self-pay

## 2023-10-25 LAB — COMPREHENSIVE METABOLIC PANEL WITH GFR
ALT: 19 IU/L (ref 0–32)
AST: 29 IU/L (ref 0–40)
Albumin: 3.8 g/dL — ABNORMAL LOW (ref 3.9–4.9)
Alkaline Phosphatase: 151 IU/L — ABNORMAL HIGH (ref 44–121)
BUN/Creatinine Ratio: 13 (ref 12–28)
BUN: 13 mg/dL (ref 8–27)
Bilirubin Total: 0.3 mg/dL (ref 0.0–1.2)
CO2: 24 mmol/L (ref 20–29)
Calcium: 9.7 mg/dL (ref 8.7–10.3)
Chloride: 104 mmol/L (ref 96–106)
Creatinine, Ser: 1.04 mg/dL — ABNORMAL HIGH (ref 0.57–1.00)
Globulin, Total: 3 g/dL (ref 1.5–4.5)
Glucose: 93 mg/dL (ref 70–99)
Potassium: 5.1 mmol/L (ref 3.5–5.2)
Sodium: 142 mmol/L (ref 134–144)
Total Protein: 6.8 g/dL (ref 6.0–8.5)
eGFR: 60 mL/min/1.73 (ref >59)

## 2023-10-25 LAB — HEMOGLOBIN A1C
Est. average glucose Bld gHb Est-mCnc: 140 mg/dL
Hgb A1c MFr Bld: 6.5 % — ABNORMAL HIGH (ref 4.8–5.6)

## 2023-10-25 LAB — MICROALBUMIN / CREATININE URINE RATIO
Creatinine, Urine: 306.4 mg/dL
Microalb/Creat Ratio: 8 mg/g{creat} (ref 0–29)
Microalbumin, Urine: 23 ug/mL

## 2023-10-25 LAB — LIPID PANEL
Chol/HDL Ratio: 2.5 ratio (ref 0.0–4.4)
Cholesterol, Total: 147 mg/dL (ref 100–199)
HDL: 60 mg/dL (ref >39)
LDL Chol Calc (NIH): 73 mg/dL (ref 0–99)
Triglycerides: 68 mg/dL (ref 0–149)
VLDL Cholesterol Cal: 14 mg/dL (ref 5–40)

## 2023-10-25 NOTE — Telephone Encounter (Signed)
 Copied from CRM [id number]. Topic: General - Other >> Oct 24, 2023  4:48 PM Ja-Kwan M wrote: Reason for CRM: Patient reports that the Rx for [facility] needs prior authorization.

## 2023-10-25 NOTE — Telephone Encounter (Signed)
 Pharmacy Patient Advocate Encounter  Received notification from [facility] MEDICAID that Prior Authorization for [facility] 0.25MG /0.5ML auto-injectors has been DENIED.  See denial reason below. No denial letter attached in CMM. Will attach denial letter to Media tab once received.   PA #/Case ID/Reference #: [id number]

## 2023-10-29 ENCOUNTER — Encounter: Payer: Self-pay | Admitting: Family Medicine

## 2023-10-31 ENCOUNTER — Other Ambulatory Visit: Payer: Self-pay | Admitting: Family Medicine

## 2023-10-31 DIAGNOSIS — J302 Other seasonal allergic rhinitis: Secondary | ICD-10-CM

## 2023-11-01 ENCOUNTER — Ambulatory Visit (INDEPENDENT_AMBULATORY_CARE_PROVIDER_SITE_OTHER): Admitting: Internal Medicine

## 2023-11-01 ENCOUNTER — Encounter: Payer: Self-pay | Admitting: Internal Medicine

## 2023-11-01 ENCOUNTER — Other Ambulatory Visit: Payer: Self-pay | Admitting: Family Medicine

## 2023-11-01 ENCOUNTER — Ambulatory Visit (INDEPENDENT_AMBULATORY_CARE_PROVIDER_SITE_OTHER): Admitting: Acute Care

## 2023-11-01 VITALS — BP 120/80 | HR 95 | Temp 98.6°F | Ht 63.0 in | Wt 196.6 lb

## 2023-11-01 DIAGNOSIS — J449 Chronic obstructive pulmonary disease, unspecified: Secondary | ICD-10-CM

## 2023-11-01 DIAGNOSIS — R748 Abnormal levels of other serum enzymes: Secondary | ICD-10-CM | POA: Diagnosis not present

## 2023-11-01 DIAGNOSIS — K219 Gastro-esophageal reflux disease without esophagitis: Secondary | ICD-10-CM

## 2023-11-01 DIAGNOSIS — Z87891 Personal history of nicotine dependence: Secondary | ICD-10-CM | POA: Diagnosis not present

## 2023-11-01 NOTE — Patient Instructions (Addendum)
 Continue Trelegy as prescribed Rinse mouth after every use  Albuterol  as needed  Follow up CT lung cancer screening this week  Avoid Allergens and Irritants Avoid secondhand smoke Avoid SICK contacts Recommend  Masking  when appropriate Recommend Keep up-to-date with vaccinations

## 2023-11-01 NOTE — Patient Instructions (Signed)

## 2023-11-01 NOTE — Progress Notes (Unsigned)
 Name: TOMMA EHINGER MRN: [id number] DOB: 1961/01/07     SYNOPSIS 63 year old female former smoker seen for initial pulmonary consult October 30, 2019 for COPD Nov 20, 2019 that showed severe COPD with FEV1 at 45%, ratio 50, FVC 69%, no significant bronchodilator response, positive mid flow reversibility and decreased diffusing capacity at 65%.   6-minute walk test last visit showed no significant desaturations with ambulation.  Overnight oximetry test was completed and did not show any significant desaturations.   July 2021 for COPD exacerbation secondary to RSV infection. Is very sedentary  Needs new nebulizer machine . Uses albuterol  few times a week.  Has quit smoking.  Has intermittent allergies , on zyrtec daily .   TEST/EVENTS :  PFTs Nov 20, 2019 showed severe COPD with FEV1 at 45%, ratio 50, FVC 69%, no significant bronchodilator response, mid flow reversibility, DLCO 65%  2D echo July 2021 EF 65 to 70%, normal pulmonary artery systolic pressure, RV SF is normal      CHIEF COMPLAINT:  Follow-up assessment for COPD  HISTORY OF PRESENT ILLNESS: Patient was diagnosed with COPD emphysema 2016 Chronic shortness of breath Chronic dyspnea on exertion Patient has been on Trelegy over the last 6 months Significant improvement in her breathing  Patient with COPD exacerbation last week Was given Z-Pak Symptoms have resolved  PFT's c/w severe obstructive airways disease   Patient is out of work due to disability 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   No exacerbation at this time No evidence of heart failure at this time No evidence or signs of infection at this time No respiratory distress No fevers, chills, nausea, vomiting, diarrhea No evidence of lower extremity edema No evidence hemoptysis    PAST MEDICAL HISTORY :   has a past medical history of Allergy, Anxiety, Arthritis, Asthma, Cataract (2016),  Complication of anesthesia, COPD (chronic obstructive pulmonary disease) (HCC), Emphysema of lung (HCC), GERD (gastroesophageal reflux disease), History of hiatal hernia, Hypertension, Hyperthyroidism, Neuromuscular disorder (HCC), PONV (postoperative nausea and vomiting), and Thyroid  disease.  has a past surgical history that includes Tubal ligation; Functional endoscopic sinus surgery; Fracture surgery (Right); Cholecystectomy; Carpal tunnel release (Right); Upper Extremity Angiography (Right, 01/23/2019); Salpingoophorectomy (Right); Upper Extremity Angiography (Right, 02/07/2019); Upper Extremity Angiography (Right, 01/01/2020); Thrombectomy brachial artery (Right, 01/01/2020); and Angioplasty (Right, 01/01/2020). Prior to Admission medications   Medication Sig Start Date End Date Taking? Authorizing Provider  Ascorbic Acid  (VITAMIN C ) 100 MG tablet Take 100 mg by mouth daily.     [provider]  aspirin  EC 81 MG tablet Take 81 mg by mouth daily.    [provider]  cetirizine (ZYRTEC) 10 MG tablet Take 10 mg by mouth daily.    [provider]  cholecalciferol  (VITAMIN D3) 25 MCG (1000 UT) tablet Take 1,000 Units by mouth daily.    [provider]  clopidogrel  (PLAVIX ) 75 MG tablet Take 1 tablet (75 mg total) by mouth daily. 02/08/19   Schnier, Ninette Basque, MD  Fluticasone -Umeclidin-Vilant (TRELEGY ELLIPTA ) 200-62.5-25 MCG/INH AEPB Inhale 1 puff into the lungs daily. 10/08/19   Gordon Latus, PA-C  losartan -hydrochlorothiazide  (HYZAAR) 50-12.5 MG tablet Take 1 tablet by mouth daily. 09/06/19   Gordon Latus, PA-C  metoprolol  tartrate (LOPRESSOR ) 25 MG tablet Take 1 tablet (25 mg total) by mouth 2 (two) times daily. 02/14/19   Stegmayer, Irven Manson, PA-C  Multiple Vitamin (MULTIVITAMIN WITH MINERALS) TABS tablet Take 1 tablet by mouth daily.  [provider]  nicotine  (NICOTROL ) 10 MG inhaler Inhale 1 Cartridge (1 continuous puffing total) into the lungs as  needed for smoking cessation. 10/08/19   Gordon Latus, PA-C  oxyCODONE  (OXY IR/ROXICODONE ) 5 MG immediate release tablet One to Two Tabs Every Six Hours As Needed For Pain 02/14/19   Stegmayer, Kimberly A, PA-C  PROAIR  HFA 108 (90 Base) MCG/ACT inhaler INHALE 2 PUFFS INTO THE LUNGS EVERY 6 HOURS AS NEEDED FOR WHEEZING OR SHORTNESS OF BREATH 07/20/19   Gordon Latus, PA-C  sertraline  (ZOLOFT ) 50 MG tablet Take 1 tablet (50 mg total) by mouth daily. 09/06/19   Gordon Latus, PA-C  SPIRIVA  HANDIHALER 18 MCG inhalation capsule INHALE CONTENTS OF 1 CAPSULE ONCE DAILY USING HANDIHALER 10/03/19   Gordon Latus, PA-C  SUBOXONE  8-2 MG FILM Take 0.5 Film by mouth See admin instructions. Take 0.5 film by mouth scheduled in the morning, may take other 0.5 film if needed for pain. 12/21/18   [provider]  triamcinolone  (NASACORT  ALLERGY 24HR) 55 MCG/ACT AERO nasal inhaler Place 1 spray into the nose daily.    [provider]  vitamin E  100 UNIT capsule Take 100 Units by mouth daily.     [provider]   Allergies  Allergen Reactions   Acetaminophen      Avoid due to fatty liver    Nsaids Other (See Comments)    irritates stomach severley    Other     Steroids-muscle weakness and headache   Sular [Nisoldipine Er] Palpitations    FAMILY HISTORY:  family history includes Breast cancer in her paternal aunt; COPD in her mother; Depression in her mother; Diabetes in her father; Hypertension in her father and mother. SOCIAL HISTORY:  reports that she quit smoking about 4 years ago. Her smoking use included cigarettes. She started smoking about 48 years ago. She has a 66 pack-year smoking history. She has never used smokeless tobacco. She reports that she does not currently use alcohol. She reports that she does not currently use drugs.  BP 120/80 (BP Location: Right Arm, Patient Position: Sitting, Cuff Size: Normal)   Pulse 95   Temp 98.6 F (37 C) (Oral)   Ht 5\' 3"   (1.6 m)   Wt 196 lb 9.6 oz (89.2 kg)   SpO2 98%   BMI 34.83 kg/m       Review of Systems: Gen:  Denies  fever, sweats, chills weight loss  HEENT: Denies blurred vision, double vision, ear pain, eye pain, hearing loss, nose bleeds, sore throat Cardiac:  No dizziness, chest pain or heaviness, chest tightness,edema, No JVD Resp:   No cough, -sputum production, -shortness of breath,-wheezing, -hemoptysis,  Other:  All other systems negative   Physical Examination:   General Appearance: No distress  EYES PERRLA, EOM intact.   NECK Supple, No JVD Pulmonary: normal breath sounds, No wheezing.  CardiovascularNormal S1,S2.  No m/r/g.   Abdomen: Benign, Soft, non-tender. Neurology UE/LE 5/5 strength, no focal deficits Ext pulses intact, cap refill intact ALL OTHER ROS ARE NEGATIVE     ASSESSMENT AND PLAN SYNOPSIS  63 year old pleasant white female with underlying diagnosis of severe COPD FEV1 45% predicted with increased shortness of breath dyspnea on exertion respiratory insufficiency with a previous history of extensive smoking history  COPD end-stage severe FEV1 45% predicted Continue Trelegy 200 Avoid Allergens and Irritants Avoid secondhand smoke Avoid SICK contacts Recommend  Masking  when appropriate Recommend Keep up-to-date with vaccinations Albuterol  as needed  Extensive smoking history lung cancer screening referral program Patient to obtain low-dose CT in the next 24 hours   Obesity -recommend significant weight loss -recommend changing diet  Deconditioned state -Recommend increased daily activity and exercise     MEDICATION ADJUSTMENTS/LABS AND TESTS ORDERED: TRELEGY 200 USE AS DIRECTED CONTINUE TO DO BREATHING EXERCISES Follow up CT  LUNG CANCER SCREENING PROGRAM   Patient satisfied with Plan of action and management. All questions answered  Total time spent 42 minutes  Follow-up in 6 months   Bronnie Vasseur Nestora Baptise, M.D.  [facility]  Medical Director [facility] Medical Director [facility] Cardio-Pulmonary Department

## 2023-11-01 NOTE — Progress Notes (Addendum)
 Provider Attestation I agree with the documentation of the Shared Decision Making visit,  smoking cessation counseling if appropriate, and verification or eligibility for lung cancer screening as documented by the RN Nurse Navigator.   Raejean Bullock, MSN, AGACNP-BC [facility] See Amion for personal pager PCCM on call pager [phone]      Virtual Visit via Telephone Note  I connected with Christy Mason on 11/01/23 at 12:30 PM EDT by telephone and verified that I am speaking with the correct person using two identifiers.  Location: Patient: Christy Mason Provider: Alyse Bach, RN   I discussed the limitations, risks, security and privacy concerns of performing an evaluation and management service by telephone and the availability of in person appointments. I also discussed with the patient that there may be a patient responsible charge related to this service. The patient expressed understanding and agreed to proceed.    Shared Decision Making Visit Lung Cancer Screening Program [phone])   Eligibility: Age 63 y.o. Pack Years Smoking History Calculation 54  (# packs/per year x # years smoked) Recent History of coughing up blood  no Unexplained weight loss? no ( >Than 15 pounds within the last 6 months ) Prior History Lung / other cancer no (Diagnosis within the last 5 years already requiring surveillance chest CT Scans). Smoking Status Former Smoker Former Smokers: Years since quit: 4 years  Quit Date: 2021  Visit Components: Discussion included one or more decision making aids. yes Discussion included risk/benefits of screening. yes Discussion included potential follow up diagnostic testing for abnormal scans. yes Discussion included meaning and risk of over diagnosis. yes Discussion included meaning and risk of False Positives. yes Discussion included meaning of total radiation exposure. yes  Counseling Included: Importance of  adherence to annual lung cancer LDCT screening. yes Impact of comorbidities on ability to participate in the program. yes Ability and willingness to under diagnostic treatment. yes  Smoking Cessation Counseling: Current Smokers:  Discussed importance of smoking cessation. yes Information about tobacco cessation classes and interventions provided to patient. yes Patient provided with "ticket" for LDCT Scan. no Symptomatic Patient. no  Counseling(Intermediate counseling: > three minutes) 99406 Diagnosis Code: Tobacco Use Z72.0 Asymptomatic Patient yes  Counseling (Intermediate counseling: > three minutes counseling) Z3664 Former Smokers:  Discussed the importance of maintaining cigarette abstinence. yes Diagnosis Code: Personal History of Nicotine  Dependence. Q03.474 Information about tobacco cessation classes and interventions provided to patient. Yes Patient provided with "ticket" for LDCT Scan. no Written Order for Lung Cancer Screening with LDCT placed in Epic. Yes (CT Chest Lung Cancer Screening Low Dose W/O CM) [id number] Z12.2-Screening of respiratory organs Z87.891-Personal history of nicotine  dependence   Alyse Bach, RN

## 2023-11-02 ENCOUNTER — Ambulatory Visit
Admission: RE | Admit: 2023-11-02 | Discharge: 2023-11-02 | Disposition: A | Discharge status: Home / Self Care | Source: Ambulatory Visit | Attending: Acute Care | Admitting: Acute Care

## 2023-11-02 DIAGNOSIS — Z87891 Personal history of nicotine dependence: Secondary | ICD-10-CM | POA: Insufficient documentation

## 2023-11-02 DIAGNOSIS — Z122 Encounter for screening for malignant neoplasm of respiratory organs: Secondary | ICD-10-CM | POA: Insufficient documentation

## 2023-11-02 LAB — GAMMA GT: GGT: 14 IU/L (ref 0–60)

## 2023-11-04 ENCOUNTER — Other Ambulatory Visit (HOSPITAL_COMMUNITY): Payer: Self-pay

## 2023-11-04 ENCOUNTER — Other Ambulatory Visit: Payer: Self-pay | Admitting: Family Medicine

## 2023-11-04 ENCOUNTER — Telehealth: Payer: Self-pay

## 2023-11-04 ENCOUNTER — Encounter: Payer: Self-pay | Admitting: Family Medicine

## 2023-11-04 DIAGNOSIS — R748 Abnormal levels of other serum enzymes: Secondary | ICD-10-CM

## 2023-11-04 DIAGNOSIS — Z13820 Encounter for screening for osteoporosis: Secondary | ICD-10-CM

## 2023-11-04 DIAGNOSIS — E0590 Thyrotoxicosis, unspecified without thyrotoxic crisis or storm: Secondary | ICD-10-CM

## 2023-11-04 NOTE — Telephone Encounter (Signed)
 Pharmacy Patient Advocate Encounter   Received notification from Pt Calls Messages that prior authorization for Mometasone  Furoate 50MCG/ACT suspension is required/requested.   Insurance verification completed.   The patient is insured through [facility] MEDICAID .   Per test claim: PA required; PA submitted to above mentioned insurance via CoverMyMeds Key/confirmation #/EOC [id number] Status is pending

## 2023-11-04 NOTE — Telephone Encounter (Signed)
 Pharmacy Patient Advocate Encounter  Received notification from [facility] MEDICAID that Prior Authorization for Mometasone  Furoate 50MCG/ACT suspension has been DENIED.  See denial reason below. No denial letter attached in CMM. Will attach denial letter to Media tab once received.   PA #/Case ID/Reference #: [id number]

## 2023-11-09 NOTE — Telephone Encounter (Unsigned)
 Copied from CRM [id number]. Topic: General - Other >> Nov 09, 2023  4:36 PM Dyane Glance wrote: Reason for CRM: Pt would like someone to call her to let her know if her insurance has approved her prescription for Wegovy 

## 2023-11-10 ENCOUNTER — Telehealth: Payer: Self-pay | Admitting: Pharmacist

## 2023-11-10 ENCOUNTER — Other Ambulatory Visit (HOSPITAL_COMMUNITY): Payer: Self-pay

## 2023-11-10 NOTE — Telephone Encounter (Signed)
 Wegovy  has been approved by the insurance through 05/12/2024    Thank you, Dene Fines, PharmD Clinical Pharmacist  [place]  Direct Dial: [phone]

## 2023-11-26 ENCOUNTER — Other Ambulatory Visit: Payer: Self-pay | Admitting: Family Medicine

## 2023-11-26 DIAGNOSIS — J449 Chronic obstructive pulmonary disease, unspecified: Secondary | ICD-10-CM

## 2023-11-28 ENCOUNTER — Other Ambulatory Visit: Payer: Self-pay | Admitting: Acute Care

## 2023-11-28 DIAGNOSIS — Z122 Encounter for screening for malignant neoplasm of respiratory organs: Secondary | ICD-10-CM

## 2023-11-28 DIAGNOSIS — Z87891 Personal history of nicotine dependence: Secondary | ICD-10-CM

## 2023-11-29 ENCOUNTER — Encounter (INDEPENDENT_AMBULATORY_CARE_PROVIDER_SITE_OTHER): Payer: Self-pay

## 2023-12-12 ENCOUNTER — Encounter: Payer: Self-pay | Admitting: Acute Care

## 2023-12-21 ENCOUNTER — Encounter: Payer: Self-pay | Admitting: Family Medicine

## 2023-12-22 MED ORDER — ONDANSETRON 4 MG PO TBDP: 4.0000 mg | ORAL_TABLET | Freq: Three times a day (TID) | ORAL | 0 refills | Status: DC | PRN

## 2023-12-22 MED ORDER — WEGOVY 0.5 MG/0.5ML ~~LOC~~ SOAJ: 0.5000 mg | SUBCUTANEOUS | 2 refills | Status: DC

## 2023-12-27 ENCOUNTER — Ambulatory Visit
Admission: RE | Admit: 2023-12-27 | Discharge: 2023-12-27 | Disposition: A | Discharge status: Home / Self Care | Source: Ambulatory Visit | Attending: Family Medicine | Admitting: Family Medicine

## 2023-12-27 DIAGNOSIS — E0590 Thyrotoxicosis, unspecified without thyrotoxic crisis or storm: Secondary | ICD-10-CM | POA: Insufficient documentation

## 2023-12-27 DIAGNOSIS — R748 Abnormal levels of other serum enzymes: Secondary | ICD-10-CM | POA: Diagnosis present

## 2023-12-27 DIAGNOSIS — Z13820 Encounter for screening for osteoporosis: Secondary | ICD-10-CM | POA: Diagnosis present

## 2023-12-27 DIAGNOSIS — Z780 Asymptomatic menopausal state: Secondary | ICD-10-CM | POA: Diagnosis present

## 2024-01-04 ENCOUNTER — Ambulatory Visit: Payer: Self-pay | Admitting: Family Medicine

## 2024-01-24 ENCOUNTER — Encounter: Payer: Self-pay | Admitting: Family Medicine

## 2024-01-24 ENCOUNTER — Other Ambulatory Visit: Payer: Self-pay | Admitting: Family Medicine

## 2024-01-24 DIAGNOSIS — J449 Chronic obstructive pulmonary disease, unspecified: Secondary | ICD-10-CM

## 2024-01-24 DIAGNOSIS — Z713 Dietary counseling and surveillance: Secondary | ICD-10-CM

## 2024-01-27 ENCOUNTER — Other Ambulatory Visit: Payer: Self-pay | Admitting: Family Medicine

## 2024-01-27 DIAGNOSIS — Z79899 Other long term (current) drug therapy: Secondary | ICD-10-CM | POA: Diagnosis not present

## 2024-01-27 MED ORDER — SEMAGLUTIDE-WEIGHT MANAGEMENT 1 MG/0.5ML ~~LOC~~ SOAJ
1.0000 mg | SUBCUTANEOUS | 2 refills | Status: DC
Start: 2024-01-27 — End: 2024-02-09

## 2024-01-28 ENCOUNTER — Other Ambulatory Visit: Payer: Self-pay | Admitting: Family Medicine

## 2024-01-28 DIAGNOSIS — Z713 Dietary counseling and surveillance: Secondary | ICD-10-CM

## 2024-01-30 ENCOUNTER — Encounter: Payer: Self-pay | Admitting: Family Medicine

## 2024-01-30 DIAGNOSIS — Z9889 Other specified postprocedural states: Secondary | ICD-10-CM

## 2024-01-30 DIAGNOSIS — I739 Peripheral vascular disease, unspecified: Secondary | ICD-10-CM

## 2024-01-30 DIAGNOSIS — Z713 Dietary counseling and surveillance: Secondary | ICD-10-CM

## 2024-01-30 DIAGNOSIS — I771 Stricture of artery: Secondary | ICD-10-CM

## 2024-01-31 ENCOUNTER — Telehealth: Payer: Self-pay

## 2024-01-31 ENCOUNTER — Other Ambulatory Visit (HOSPITAL_COMMUNITY): Payer: Self-pay

## 2024-01-31 NOTE — Telephone Encounter (Signed)
 PA request has been Received. New Encounter has been or will be created for follow up. For additional info see Pharmacy Prior Auth telephone encounter from 07/22.

## 2024-01-31 NOTE — Telephone Encounter (Signed)
*  [employer] Patient Advocate Encounter   Received notification from CoverMyMeds that prior authorization for Wegovy  1MG /0.5ML auto-injectors  is required/requested.   Insurance verification completed.   The patient is insured through [facility] .   Per test claim: PA required; PA submitted to above mentioned insurance via CoverMyMeds Key/confirmation #/EOC [id number] Status is pending

## 2024-01-31 NOTE — Telephone Encounter (Signed)
 Please sign off on request-this dose has been changed and will not allow us  to complete request.

## 2024-01-31 NOTE — Telephone Encounter (Signed)
 Copied from CRM [id number]. Topic: Clinical - Prescription Issue >> Jan 31, 2024  9:20 AM Vena H wrote: Reason for CRM: [facility] Semaglutide -Weight Management 1 MG/0.5ML SOAJ. Please reach out to pt

## 2024-01-31 NOTE — Telephone Encounter (Signed)
 An updated prescription was sent in for patient 01/27/24. Looks like a PA has been started. Called patient to clarify on what is needed and states she has been receiving text messages a prescription is needed. Advised patient to call the pharmacy and informed her as above.

## 2024-02-06 ENCOUNTER — Other Ambulatory Visit (HOSPITAL_COMMUNITY): Payer: Self-pay

## 2024-02-06 NOTE — Telephone Encounter (Signed)
 Pharmacy Patient Advocate Encounter  Received notification from [employer] that Prior Authorization for Wegovy  has been DENIED.  Full denial letter will be uploaded to the media tab. See denial reason below.    PA #/Case ID/Reference #: [id number]

## 2024-02-06 NOTE — Telephone Encounter (Signed)
 PA has been denied as weight loss meds are not covered by medicare part D. Wegovy  can be covered under medicare part D if pt has evidence of any of the following: Myocardial infarction (MI), (2) Prior ischemic or hemorrhagic stroke, (3) Symptomatic peripheral arterial disease (PAD) as evidence by one of the following (a) Intermittent claudication with ankle-brachial index (ABI) less than 0.85 (at rest); (b) Peripheral arterial revascularization procedure; (c) Amputation due to atherosclerotic disease

## 2024-02-09 ENCOUNTER — Telehealth: Payer: Self-pay | Admitting: Pharmacist

## 2024-02-09 MED ORDER — SEMAGLUTIDE-WEIGHT MANAGEMENT 1 MG/0.5ML ~~LOC~~ SOAJ
1.0000 mg | SUBCUTANEOUS | 2 refills | Status: DC
Start: 2024-02-09 — End: 2024-02-18

## 2024-02-09 NOTE — Telephone Encounter (Signed)
 Pt advised via mychart. Appeal was requested to be completed by provider earlier today (please see patient msg from 01/30/24 ([facility]y ))

## 2024-02-09 NOTE — Telephone Encounter (Signed)
 Appeal has been submitted for Wegovy . Will advise when response is received or follow up in 1 week. Please be advised that most companies may take 30 days to make a decision. Appeal letter and supporting documentation have been faxed to [phone] on 02/09/2024 at 4:29 pm.  Thank you, Devere Pandy, PharmD Clinical Pharmacist  [place]  Direct Dial: [phone]

## 2024-02-14 ENCOUNTER — Encounter (INDEPENDENT_AMBULATORY_CARE_PROVIDER_SITE_OTHER): Payer: Self-pay | Admitting: Nurse Practitioner

## 2024-02-14 ENCOUNTER — Ambulatory Visit (INDEPENDENT_AMBULATORY_CARE_PROVIDER_SITE_OTHER): Payer: Medicaid Other | Admitting: Nurse Practitioner

## 2024-02-14 ENCOUNTER — Ambulatory Visit (INDEPENDENT_AMBULATORY_CARE_PROVIDER_SITE_OTHER): Payer: Medicaid Other

## 2024-02-14 VITALS — BP 144/74 | HR 64 | Resp 18 | Ht 61.0 in | Wt 188.0 lb

## 2024-02-14 DIAGNOSIS — I2510 Atherosclerotic heart disease of native coronary artery without angina pectoris: Secondary | ICD-10-CM

## 2024-02-14 DIAGNOSIS — I10 Essential (primary) hypertension: Secondary | ICD-10-CM | POA: Diagnosis not present

## 2024-02-14 DIAGNOSIS — E782 Mixed hyperlipidemia: Secondary | ICD-10-CM | POA: Diagnosis not present

## 2024-02-14 DIAGNOSIS — I771 Stricture of artery: Secondary | ICD-10-CM

## 2024-02-14 DIAGNOSIS — I7776 Dissection of artery of upper extremity: Secondary | ICD-10-CM | POA: Diagnosis not present

## 2024-02-14 NOTE — Progress Notes (Signed)
 Subjective:    Patient ID: Christy Mason, female    DOB: [birth year], 63 y.o.   MRN: [id number] Chief Complaint  Patient presents with   Follow-up    1 year follow up R upper extremity stents     History of Present Illness:    The patient is a 63 year old female that presents today as a return for evaluation due to her right subclavian stent placement on 01/23/2019.  The patient was last seen in 2021 and since that time she denies any significant problems or issues with her upper extremity.  She denies any pain.  She denies any numbness and tingling.  She has no dizziness noted.  Overall she is doing well.   Today noninvasive studies show biphasic/triphasic waveforms throughout the right upper extremity no notable significant obstruction.  Patent subclavian stent with normal velocities throughout.  It is noted that her right upper extremity her ulnar has dominant flow to her hand but her right is more diminutive.  The patient also notes that she recently had lung screening CT scan and there was some concern because it was noted that she had mild atherosclerosis of her LAD and right coronary artery.  She denies any chest pain or palpitations    Review of Systems  Cardiovascular:  Negative for chest pain and palpitations.  All other systems reviewed and are negative.      Objective:   Physical Exam Vitals reviewed.  HENT:     Head: Normocephalic.  Cardiovascular:     Rate and Rhythm: Normal rate.     Pulses:          Radial pulses are 0 on the right side and 2+ on the left side.  Pulmonary:     Effort: Pulmonary effort is normal.  Skin:    General: Skin is warm and dry.  Neurological:     Mental Status: She is alert and oriented to person, place, and time.  Psychiatric:        Mood and Affect: Mood normal.        Behavior: Behavior normal.        Thought Content: Thought content normal.        Judgment: Judgment normal.     BP (!) 144/74 (BP Location: Left Arm, Patient  Position: Sitting, Cuff Size: Normal)   Pulse 64   Resp 18   Ht 5' 1 (1.549 m)   Wt 188 lb (85.3 kg)   BMI 35.52 kg/m   Past Medical History:  Diagnosis Date   Allergy    Anxiety    Arthritis    Asthma    Cataract 2016   Complication of anesthesia    COPD (chronic obstructive pulmonary disease) (HCC)    Emphysema of lung (HCC)    GERD (gastroesophageal reflux disease)    History of hiatal hernia    Hypertension    Hyperthyroidism    h/o   Neuromuscular disorder (HCC)    PONV (postoperative nausea and vomiting)    Thyroid  disease     Social History   Socioeconomic History   Marital status: Divorced    Spouse name: Not on file   Number of children: 4   Years of education: Not on file   Highest education level: Associate degree: occupational, Scientist, product/process development, or vocational program  Occupational History   Occupation: [employer]  Tobacco Use   Smoking status: Former    Current packs/day: 0.00    Average packs/day: 1.5 packs/day for 44.0 years (66.0  ttl pk-yrs)    Types: Cigarettes    Start date: 72    Quit date: 07/13/2019    Years since quitting: 4.5   Smokeless tobacco: Never  Vaping Use   Vaping status: Former  Substance and Sexual Activity   Alcohol use: Not Currently   Drug use: Not Currently   Sexual activity: Not Currently    Birth control/protection: None  Other Topics Concern   Not on file  Social History Narrative   Not on file   Social Drivers of Health   Financial Resource Strain: Medium Risk (10/20/2023)   Overall Financial Resource Strain (CARDIA)    Difficulty of Paying Living Expenses: Somewhat hard  Food Insecurity: Food Insecurity Present (10/20/2023)   Hunger Vital Sign    Worried About Running Out of Food in the Last Year: Sometimes true    Ran Out of Food in the Last Year: Sometimes true  Transportation Needs: No Transportation Needs (10/20/2023)   PRAPARE - Administrator, Civil Service (Medical): No    Lack of Transportation  (Non-Medical): No  Physical Activity: Unknown (04/24/2023)   Exercise Vital Sign    Days of Exercise per Week: 0 days    Minutes of Exercise per Session: Not on file  Stress: No Stress Concern Present (10/20/2023)   [employer] of Occupational Health - Occupational Stress Questionnaire    Feeling of Stress : Not at all  Social Connections: Moderately Isolated (10/20/2023)   Social Connection and Isolation Panel    Frequency of Communication with Friends and Family: More than three times a week    Frequency of Social Gatherings with Friends and Family: More than three times a week    Attends Religious Services: More than 4 times per year    Active Member of [employer] or Organizations: No    Attends Banker Meetings: Not on file    Marital Status: Divorced  Intimate Partner Violence: Not At Risk (02/07/2019)   Humiliation, Afraid, Rape, and Kick questionnaire    Fear of Current or Ex-Partner: No    Emotionally Abused: No    Physically Abused: No    Sexually Abused: No    Past Surgical History:  Procedure Laterality Date   ANGIOPLASTY Right 01/01/2020   Procedure: ANGIOPLASTY ans STENT PLACEMENT RIGHT SUBCLAVIAN AND AXILLARY ARTERY;  Surgeon: Jama Cordella MATSU, MD;  Location: [facility] OR[facility];  Service: Vascular;  Laterality: Right;   CARPAL TUNNEL RELEASE Right    CHOLECYSTECTOMY     FRACTURE SURGERY Right    plates   FUNCTIONAL ENDOSCOPIC SINUS SURGERY     SALPINGOOPHORECTOMY Right    THROMBECTOMY BRACHIAL ARTERY Right 01/01/2020   Procedure: THROMBECTOMY BRACHIAL AXILLARY ARTERY;  Surgeon: Jama Cordella MATSU, MD;  Location: [facility];  Service: Vascular;  Laterality: Right;   TUBAL LIGATION     UPPER EXTREMITY ANGIOGRAPHY Right 01/23/2019   Procedure: UPPER EXTREMITY ANGIOGRAPHY;  Surgeon: Jama Cordella MATSU, MD;  Location: [facility];  Service: Cardiovascular;  Laterality: Right;   UPPER EXTREMITY ANGIOGRAPHY Right 02/07/2019   Procedure: UPPER EXTREMITY  ANGIOGRAPHY;  Surgeon: Jama Cordella MATSU, MD;  Location: [facility];  Service: Cardiovascular;  Laterality: Right;   UPPER EXTREMITY ANGIOGRAPHY Right 01/01/2020   Procedure: UPPER EXTREMITY ANGIOGRAPHY;  Surgeon: Jama Cordella MATSU, MD;  Location: [facility];  Service: Cardiovascular;  Laterality: Right;    Family History  Problem Relation Age of Onset   Depression Mother    Hypertension Mother  COPD Mother    Diabetes Father    Hypertension Father    Breast cancer Paternal Aunt     Allergies  Allergen Reactions   Acetaminophen      Avoid due to fatty liver    Nsaids Other (See Comments)    irritates stomach severley    Other     Steroids-muscle weakness and headache   Sular [Nisoldipine Er] Palpitations       Latest Ref Rng & Units 03/30/2022   10:16 AM 11/26/2021    2:23 PM 04/10/2020    3:18 PM  CBC  WBC 3.4 - 10.8 x10E3/uL 10.3  12.4  8.6   Hemoglobin 11.1 - 15.9 g/dL 87.4  86.7  87.4   Hematocrit 34.0 - 46.6 % 38.5  38.9  37.1   Platelets 150 - 450 x10E3/uL 257  254  239       CMP     Component Value Date/Time   NA 142 10/24/2023 1004   NA 136 03/19/2014 1818   K 5.1 10/24/2023 1004   K 3.0 (L) 03/19/2014 1818   CL 104 10/24/2023 1004   CL 101 03/19/2014 1818   CO2 24 10/24/2023 1004   CO2 24 03/19/2014 1818   GLUCOSE 93 10/24/2023 1004   GLUCOSE 130 (H) 02/08/2020 0434   GLUCOSE 139 (H) 03/19/2014 1818   BUN 13 10/24/2023 1004   BUN 10 03/19/2014 1818   CREATININE 1.04 (H) 10/24/2023 1004   CREATININE 0.86 03/19/2014 1818   CALCIUM  9.7 10/24/2023 1004   CALCIUM  10.4 (H) 03/19/2014 1818   PROT 6.8 10/24/2023 1004   PROT 9.3 (H) 03/19/2014 1818   ALBUMIN 3.8 (L) 10/24/2023 1004   ALBUMIN 4.0 03/19/2014 1818   AST 29 10/24/2023 1004   AST 24 03/19/2014 1818   ALT 19 10/24/2023 1004   ALT 16 03/19/2014 1818   ALKPHOS 151 (H) 10/24/2023 1004   ALKPHOS 104 03/19/2014 1818   BILITOT 0.3 10/24/2023 1004   BILITOT 0.6 03/19/2014  1818   EGFR 60 10/24/2023 1004   GFRNONAA 82 04/10/2020 1518   GFRNONAA >60 03/19/2014 1818     No results found.     Assessment & Plan:   1. Subclavian arterial stenosis (HCC) (Primary) Recommend:   The patient is status post successful angiogram with intervention, right subclavian stent placement on 01/23/2019.  The patient reports that the arm claudication symptoms and  pain is resolved.   The patient denies lifestyle limiting changes at this point in time.   No further invasive studies, angiography or surgery at this time.   The patient should continue walking and begin a more formal exercise program.    The patient should continue antiplatelet therapy and aggressive treatment of the lipid abnormalities   Continued surveillance is indicated as atherosclerosis is likely to progress with time.     Patient should undergo noninvasive studies as ordered. The patient will follow up with me to review the studies.   It is also noted today that she has a small radial artery in her right upper extremity.  However she has biphasic flow.  It is noted that her ulnar artery is dominant in that hand.  No role for intervention currently.   2. Essential hypertension Continue antihypertensive medications as already ordered, these medications have been reviewed and there are no changes at this time.  3. Mixed hyperlipidemia Continue statin as ordered and reviewed, no changes at this time  4. Atherosclerosis of native coronary artery of native heart  without angina pectoris The patient did have evidence of mild atherosclerosis on her CT scan.  Given her history is a previous smoker with hypertension hyperlipidemia, I feel it may be a good idea to refer her to cardiology for further evaluation and workup if required. - Ambulatory referral to Cardiology   Current Outpatient Medications on File Prior to Visit  Medication Sig Dispense Refill   albuterol  (PROVENTIL ) (2.5 MG/3ML) 0.083% nebulizer  solution USE 1 VIAL VIA NEBULIZER EVERY 4 HOURS AS NEEDED FOR WHEEZING OR SHORTNESS OF BREATH 225 mL 6   aspirin  EC 81 MG tablet Take 81 mg by mouth daily.     atorvastatin  (LIPITOR) 20 MG tablet Take 1 tablet (20 mg total) by mouth daily. 90 tablet 0   cetirizine (ZYRTEC) 10 MG tablet Take 10 mg by mouth daily.     cholecalciferol  (VITAMIN D3) 25 MCG (1000 UNIT) tablet Take 1,000 Units by mouth daily.     desonide  (DESOWEN ) 0.05 % cream Apply topically 2 (two) times daily. 30 g 0   ketoconazole  (NIZORAL ) 2 % cream Apply 1 Application topically daily. 15 g 0   lidocaine  (XYLOCAINE ) 5 % ointment Apply 1 Application topically 2 (two) times daily as needed. 35 g 3   losartan  (COZAAR ) 50 MG tablet TAKE 1 TABLET BY MOUTH EVERY DAY 90 tablet 1   mometasone  (NASONEX ) 50 MCG/ACT nasal spray Place 2 sprays into the nose daily. 1 each 12   Multiple Vitamin (MULTIVITAMIN WITH MINERALS) TABS tablet Take 1 tablet by mouth daily.     omeprazole  (PRILOSEC) 20 MG capsule TAKE 1 CAPSULE BY MOUTH EVERY DAY 90 capsule 1   ondansetron  (ZOFRAN -ODT) 4 MG disintegrating tablet Take 1 tablet (4 mg total) by mouth every 8 (eight) hours as needed for nausea or vomiting. 20 tablet 0   Semaglutide -Weight Management 1 MG/0.5ML SOAJ Inject 1 mg into the skin once a week. 2 mL 2   SUBOXONE  8-2 MG FILM Take by mouth 2 (two) times daily.     TRELEGY ELLIPTA  200-62.5-25 MCG/ACT AEPB INHALE 1 PUFF INTO THE LUNGS EVERY DAY 60 each 10   triamcinolone  (NASACORT ) 55 MCG/ACT AERO nasal inhaler Place 1 spray into the nose daily as needed (allergies). 1 each 5   triamcinolone  ointment (KENALOG ) 0.1 % Apply 1 Application topically 2 (two) times daily. 30 g 1   VENTOLIN  HFA 108 (90 Base) MCG/ACT inhaler TAKE 2 PUFFS BY MOUTH EVERY 6 HOURS AS NEEDED FOR WHEEZE OR SHORTNESS OF BREATH 18 each 1   ZOVIRAX 5 % Apply topically 3 (three) times daily.     No current facility-administered medications on file prior to visit.    There are no  Patient Instructions on file for this visit. No follow-ups on file.   Jabaree Mercado E Lateesha Bezold, NP

## 2024-02-15 ENCOUNTER — Encounter: Payer: Self-pay | Admitting: Family Medicine

## 2024-02-15 DIAGNOSIS — Z9889 Other specified postprocedural states: Secondary | ICD-10-CM

## 2024-02-15 DIAGNOSIS — I771 Stricture of artery: Secondary | ICD-10-CM

## 2024-02-18 MED ORDER — WEGOVY 0.25 MG/0.5ML ~~LOC~~ SOAJ: 0.2500 mg | SUBCUTANEOUS | 0 refills | Status: DC

## 2024-02-21 ENCOUNTER — Other Ambulatory Visit: Payer: Self-pay | Admitting: Family Medicine

## 2024-02-21 DIAGNOSIS — G8929 Other chronic pain: Secondary | ICD-10-CM

## 2024-02-21 NOTE — Telephone Encounter (Signed)
 [facility] ref[facility] request for the following medications:   lidocaine  (XYLOCAINE ) 5 % ointment    Please advise.

## 2024-02-21 NOTE — Telephone Encounter (Signed)
 LOV: 10/24/2023 NOV: 04/24/2024 LRF: 08/03/2022 18d supply 3rf

## 2024-02-22 MED ORDER — LIDOCAINE 5 % EX OINT
1.0000 | TOPICAL_OINTMENT | Freq: Two times a day (BID) | CUTANEOUS | 3 refills | Status: AC | PRN
Start: 2024-02-22 — End: ?

## 2024-02-24 ENCOUNTER — Other Ambulatory Visit (HOSPITAL_COMMUNITY): Payer: Self-pay

## 2024-03-05 ENCOUNTER — Other Ambulatory Visit (HOSPITAL_COMMUNITY): Payer: Self-pay

## 2024-03-05 NOTE — Telephone Encounter (Signed)
 Insurance approved the appeal for Wegovy  through December 2025

## 2024-03-07 DIAGNOSIS — R21 Rash and other nonspecific skin eruption: Secondary | ICD-10-CM | POA: Diagnosis not present

## 2024-03-07 DIAGNOSIS — Z79899 Other long term (current) drug therapy: Secondary | ICD-10-CM | POA: Diagnosis not present
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
## 2024-03-20 ENCOUNTER — Ambulatory Visit

## 2024-03-20 VITALS — BP 130/78 | HR 79 | Ht 61.5 in | Wt 184.8 lb

## 2024-03-20 DIAGNOSIS — I700 Atherosclerosis of aorta: Secondary | ICD-10-CM | POA: Diagnosis not present

## 2024-03-20 DIAGNOSIS — I739 Peripheral vascular disease, unspecified: Secondary | ICD-10-CM | POA: Diagnosis not present

## 2024-03-20 DIAGNOSIS — E782 Mixed hyperlipidemia: Secondary | ICD-10-CM | POA: Diagnosis not present

## 2024-03-20 DIAGNOSIS — I2510 Atherosclerotic heart disease of native coronary artery without angina pectoris: Secondary | ICD-10-CM | POA: Diagnosis not present

## 2024-03-20 MED ORDER — ATORVASTATIN CALCIUM 40 MG PO TABS: 40.0000 mg | ORAL_TABLET | Freq: Every day | ORAL | 3 refills | Status: AC

## 2024-03-20 NOTE — Patient Instructions (Signed)
 Medication Instructions:  Your physician recommends the following medication changes.  INCREASE: Atorvastatin  (LIPITOR) from 20 mg to 40 mg once daily by mouth  Continue all other medications as prescribed.   *If you need a refill on your cardiac medications before your next appointment, please call your pharmacy*  Lab Work:  No labs ordered today   If you have labs (blood work) drawn today and your tests are completely normal, you will receive your results only by: MyChart Message (if you have MyChart) OR A paper copy in the mail If you have any lab test that is abnormal or we need to change your treatment, we will call you to review the results.  Testing/Procedures:  No test ordered today   Follow-Up: At [facility], you and your health needs are our priority.  As part of our continuing mission to provide you with exceptional heart care, our providers are all part of one team.  This team includes your primary Cardiologist (physician) and Advanced Practice Providers or APPs (Physician Assistants and Nurse Practitioners) who all work together to provide you with the care you need, when you need it.  Your next appointment:   12 month(s)  Provider:   Caron Poser, MD   We recommend signing up for the patient portal called MyChart.  Sign up information is provided on this After Visit Summary.  MyChart is used to connect with patients for Virtual Visits (Telemedicine).  Patients are able to view lab/test results, encounter notes, upcoming appointments, etc.  Non-urgent messages can be sent to your provider as well.   To learn more about what you can do with MyChart, go to [email or web address].

## 2024-03-31 ENCOUNTER — Other Ambulatory Visit: Payer: Self-pay | Admitting: Family Medicine

## 2024-03-31 DIAGNOSIS — J449 Chronic obstructive pulmonary disease, unspecified: Secondary | ICD-10-CM

## 2024-04-02 NOTE — Telephone Encounter (Signed)
 Requested Prescriptions  Pending Prescriptions Disp Refills   albuterol  (VENTOLIN  HFA) 108 (90 Base) MCG/ACT inhaler [Pharmacy Med Name: ALBUTEROL  HFA (VENTOLIN ) INH] 18 each 1    Sig: TAKE 2 PUFFS BY MOUTH EVERY 6 HOURS AS NEEDED FOR WHEEZE OR SHORTNESS OF BREATH     Pulmonology:  Beta Agonists 2 Passed - 04/02/2024  1:22 PM      Passed - Last BP in normal range    BP Readings from Last 1 Encounters:  03/20/24 130/78         Passed - Last Heart Rate in normal range    Pulse Readings from Last 1 Encounters:  03/20/24 79         Passed - Valid encounter within last 12 months    Recent Outpatient Visits           5 months ago Follow-up exam, 3-6 months since previous exam   [facility] [place], Lauraine SAILOR, DO

## 2024-04-04 DIAGNOSIS — Z79899 Other long term (current) drug therapy: Secondary | ICD-10-CM | POA: Diagnosis not present

## 2024-04-12 ENCOUNTER — Other Ambulatory Visit: Payer: Self-pay | Admitting: Family Medicine

## 2024-04-24 ENCOUNTER — Encounter: Payer: Self-pay | Admitting: Family Medicine

## 2024-04-24 ENCOUNTER — Ambulatory Visit: Admitting: Family Medicine

## 2024-04-24 ENCOUNTER — Other Ambulatory Visit (HOSPITAL_COMMUNITY): Payer: Self-pay

## 2024-04-24 VITALS — BP 121/88 | HR 85 | Temp 98.1°F | Ht 61.5 in | Wt 184.5 lb

## 2024-04-24 DIAGNOSIS — Z23 Encounter for immunization: Secondary | ICD-10-CM

## 2024-04-24 DIAGNOSIS — Z9889 Other specified postprocedural states: Secondary | ICD-10-CM

## 2024-04-24 DIAGNOSIS — R7303 Prediabetes: Secondary | ICD-10-CM

## 2024-04-24 DIAGNOSIS — I739 Peripheral vascular disease, unspecified: Secondary | ICD-10-CM | POA: Diagnosis not present

## 2024-04-24 DIAGNOSIS — E0590 Thyrotoxicosis, unspecified without thyrotoxic crisis or storm: Secondary | ICD-10-CM | POA: Diagnosis not present

## 2024-04-24 DIAGNOSIS — R21 Rash and other nonspecific skin eruption: Secondary | ICD-10-CM

## 2024-04-24 DIAGNOSIS — J441 Chronic obstructive pulmonary disease with (acute) exacerbation: Secondary | ICD-10-CM

## 2024-04-24 DIAGNOSIS — I771 Stricture of artery: Secondary | ICD-10-CM

## 2024-04-24 DIAGNOSIS — I10 Essential (primary) hypertension: Secondary | ICD-10-CM

## 2024-04-24 DIAGNOSIS — R0683 Snoring: Secondary | ICD-10-CM | POA: Diagnosis not present

## 2024-04-24 DIAGNOSIS — E782 Mixed hyperlipidemia: Secondary | ICD-10-CM

## 2024-04-24 MED ORDER — WEGOVY 1 MG/0.5ML ~~LOC~~ SOAJ: 1.0000 mg | SUBCUTANEOUS | 3 refills | Status: AC

## 2024-04-24 MED ORDER — WEGOVY 0.5 MG/0.5ML ~~LOC~~ SOAJ: 0.5000 mg | SUBCUTANEOUS | 0 refills | Status: AC

## 2024-04-24 NOTE — Progress Notes (Signed)
 Established patient visit   Patient: Christy Mason   DOB: [birth year]   63 y.o. Female  MRN: [id number] Visit Date: 04/24/2024  Today's healthcare provider: LAURAINE LOISE BUOY, DO   Chief Complaint  Patient presents with   Medical Management of Chronic Issues    -Patient is here for a follow up on chronic issues.   -Brought paperwork insurance is not wanting to pay for Wegovy , suggested Zepbound.  Flu Vaccine- yes   Subjective    HPI Christy Mason is a 63 year old female with COPD and hyperlipidemia who presents for follow-up and concerned about a recent rash.  She has been experiencing a rash that appears primarily on her legs and face, with the face being particularly severe. Initially, she suspected pineapple consumption as the cause but discontinued it without resolution. She has not changed any other products such as laundry detergent, lotions, or body sprays. The hives occur every couple of days with varying severity, and the last episode was last week. She wonders if she might have developed an allergy to her medication, as the rash seems to appear a few days after taking her weekly injection.  She has been on Wegovy , starting with a dose of 0.25 mg and then increasing to 0.5 mg for the past two months. She has not yet been on the 1 mg dose due to issues with prior authorization. She took a 0.25 mg dose yesterday and has not experienced a rash to this point. She has been on Wegovy  for several months without prior issues, and she is concerned about the recent development of the rash.  She has a history of hyperlipidemia and is currently on Lipitor, which was recently increased to 40 mg by her cardiologist due to cholesterol buildup in her arteries. She also has COPD, which makes physical activities like changing her grandson's diaper exhausting. She uses Trelegy daily for COPD management and reports that missing a dose would significantly impact her breathing. She experiences  prolonged colds with cough and congestion, which she attributes to COPD, and uses Mucinex for relief.  No sleep apnea, but she reports snoring and occasionally waking herself up. She is physically active, trying to walk around stores three to four times a week, and plans to join a gym as her Medicare covers it.  She has a past medical history of hyperthyroidism, which was treated and has since stabilized. She experienced a thyroid  storm in her forties and was on medication for a long time before it resolved.      Medications: Outpatient Medications Prior to Visit  Medication Sig   albuterol  (PROVENTIL ) (2.5 MG/3ML) 0.083% nebulizer solution USE 1 VIAL VIA NEBULIZER EVERY 4 HOURS AS NEEDED FOR WHEEZING OR SHORTNESS OF BREATH   albuterol  (VENTOLIN  HFA) 108 (90 Base) MCG/ACT inhaler TAKE 2 PUFFS BY MOUTH EVERY 6 HOURS AS NEEDED FOR WHEEZE OR SHORTNESS OF BREATH   aspirin  EC 81 MG tablet Take 81 mg by mouth daily.   atorvastatin  (LIPITOR) 40 MG tablet Take 1 tablet (40 mg total) by mouth daily.   cetirizine (ZYRTEC) 10 MG tablet Take 10 mg by mouth daily.   cholecalciferol  (VITAMIN D3) 25 MCG (1000 UNIT) tablet Take 1,000 Units by mouth daily.   desonide  (DESOWEN ) 0.05 % cream Apply topically 2 (two) times daily.   ketoconazole  (NIZORAL ) 2 % cream Apply 1 Application topically daily.   lidocaine  (XYLOCAINE ) 5 % ointment Apply 1 Application topically 2 (two) times daily as  needed.   losartan  (COZAAR ) 50 MG tablet TAKE 1 TABLET BY MOUTH EVERY DAY   mometasone  (NASONEX ) 50 MCG/ACT nasal spray Place 2 sprays into the nose daily.   Multiple Vitamin (MULTIVITAMIN WITH MINERALS) TABS tablet Take 1 tablet by mouth daily.   omeprazole  (PRILOSEC) 20 MG capsule TAKE 1 CAPSULE BY MOUTH EVERY DAY   ondansetron  (ZOFRAN -ODT) 4 MG disintegrating tablet Take 1 tablet (4 mg total) by mouth every 8 (eight) hours as needed for nausea or vomiting.   SUBOXONE  8-2 MG FILM Take by mouth 2 (two) times daily.    TRELEGY ELLIPTA  200-62.5-25 MCG/ACT AEPB INHALE 1 PUFF INTO THE LUNGS EVERY DAY   triamcinolone  (NASACORT ) 55 MCG/ACT AERO nasal inhaler Place 1 spray into the nose daily as needed (allergies).   triamcinolone  ointment (KENALOG ) 0.1 % Apply 1 Application topically 2 (two) times daily.   ZOVIRAX 5 % Apply topically 3 (three) times daily.   [DISCONTINUED] semaglutide -weight management (WEGOVY ) 0.25 MG/0.5ML SOAJ SQ injection Inject 0.25 mg into the skin once a week.   No facility-administered medications prior to visit.        Objective    BP 121/88 (BP Location: Left Arm, Patient Position: Sitting, Cuff Size: Normal)   Pulse 85   Temp 98.1 F (36.7 C) (Oral)   Ht 5' 1.5 (1.562 m)   Wt 184 lb 8 oz (83.7 kg)   SpO2 99%   BMI 34.30 kg/m     Physical Exam Vitals and nursing note reviewed.  Constitutional:      General: She is not in acute distress.    Appearance: Normal appearance.  HENT:     Head: Normocephalic and atraumatic.  Eyes:     General: No scleral icterus.    Conjunctiva/sclera: Conjunctivae normal.  Cardiovascular:     Rate and Rhythm: Normal rate.  Pulmonary:     Effort: Pulmonary effort is normal.  Neurological:     Mental Status: She is alert and oriented to person, place, and time. Mental status is at baseline.  Psychiatric:        Mood and Affect: Mood normal.        Behavior: Behavior normal.      No results found for any visits on 04/24/24.  Assessment & Plan    Peripheral arterial disease with history of revascularization -     Wegovy ; Inject 0.5 mg into the skin once a week.  Dispense: 2 mL; Refill: 0 -     Wegovy ; Inject 1 mg into the skin once a week.  Dispense: 2 mL; Refill: 3  Subclavian arterial stenosis -     Wegovy ; Inject 0.5 mg into the skin once a week.  Dispense: 2 mL; Refill: 0 -     Wegovy ; Inject 1 mg into the skin once a week.  Dispense: 2 mL; Refill: 3  Rash  Loud snoring -     Ambulatory referral to Sleep  Studies  Prediabetes -     Hemoglobin A1c  Need for influenza vaccination -     Flu vaccine HIGH DOSE PF(Fluzone Trivalent)  Hyperthyroidism -     TSH  Chronic obstructive pulmonary disease with acute exacerbation (HCC) Assessment & Plan: COPD managed with Trelegy, no acute exacerbations. Engages in physical activity. - Continue Trelegy daily. - Encourage physical activity, such as walking in stores and joining a gym for treadmill exercise.  Orders: -     Ambulatory referral to Sleep Studies  Mixed hyperlipidemia Assessment & Plan: Chronic, stable.  Hyperlipidemia managed with Lipitor, dosage increased to 40 mg daily by cardiologist due to arterial cholesterol buildup. - Continue Lipitor as prescribed by cardiologist.   Orders: -     Lipid panel  Essential hypertension Assessment & Plan: Chronic, stable. Continue losartan  50 mg. No changes today.  Orders: -     Comprehensive metabolic panel with GFR     Peripheral arterial disease with history of revascularization; subclavian arterial stenosis; weight loss counseling; severe obesity (BMI 35.0-35.9 with comorbidity); prediabetes  Severe peripheral arterial disease with previous revascularization.  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  Previously on Wegovy  to address PAD and reduce need for future revascularization procedures but having issues with prior authorization.  Last dose was this past Sunday 0.25 mg.  Will place new orders today and request prior authorization to be submitted. - Order Wegovy  0.5 mg and 1 mg doses. - Instruct her to document severity of hives on a scale of 0 to 10. - Submit prior authorization for Wegovy .   Rash  Waxing and waning.  Not present today.  Patient to continue to monitor and write down when rash is occurring to help determine if there is a particular pattern.  Loud snoring Loud snoring noted with patient admitting to waking  herself up due to snoring.  Will send referral to sleep studies for further evaluation of possible sleep apnea.     Return in about 4 weeks (around 05/22/2024) for Welcome to Medicare visit.      I discussed the assessment and treatment plan with the patient  The patient was provided an opportunity to ask questions and all were answered. The patient agreed with the plan and demonstrated an understanding of the instructions.   The patient was advised to call back or seek an in-person evaluation if the symptoms worsen or if the condition fails to improve as anticipated.    LAURAINE LOISE BUOY, DO  [facility] [phone] (phone) [phone] (fax)  [facility]

## 2024-04-24 NOTE — Patient Instructions (Signed)
 Recommended vaccines: Tdap (tetanus, diphtheria and pertussis) and Shingrix (shingles).

## 2024-04-24 NOTE — Assessment & Plan Note (Signed)
 Chronic, stable. Continue losartan  50 mg. No changes today.

## 2024-04-24 NOTE — Assessment & Plan Note (Signed)
 Chronic, stable. Hyperlipidemia managed with Lipitor, dosage increased to 40 mg daily by cardiologist due to arterial cholesterol buildup. - Continue Lipitor as prescribed by cardiologist.

## 2024-04-24 NOTE — Assessment & Plan Note (Signed)
 COPD managed with Trelegy, no acute exacerbations. Engages in physical activity. - Continue Trelegy daily. - Encourage physical activity, such as walking in stores and joining a gym for treadmill exercise.

## 2024-04-25 LAB — COMPREHENSIVE METABOLIC PANEL WITH GFR
ALT: 13 IU/L (ref 0–32)
AST: 25 IU/L (ref 0–40)
Albumin: 3.8 g/dL — ABNORMAL LOW (ref 3.9–4.9)
Alkaline Phosphatase: 138 IU/L — ABNORMAL HIGH (ref 49–135)
BUN/Creatinine Ratio: 10 — ABNORMAL LOW (ref 12–28)
BUN: 10 mg/dL (ref 8–27)
Bilirubin Total: 0.4 mg/dL (ref 0.0–1.2)
CO2: 19 mmol/L — ABNORMAL LOW (ref 20–29)
Calcium: 9.7 mg/dL (ref 8.7–10.3)
Chloride: 106 mmol/L (ref 96–106)
Creatinine, Ser: 0.96 mg/dL (ref 0.57–1.00)
Globulin, Total: 2.9 g/dL (ref 1.5–4.5)
Glucose: 98 mg/dL (ref 70–99)
Potassium: 4.7 mmol/L (ref 3.5–5.2)
Sodium: 143 mmol/L (ref 134–144)
Total Protein: 6.7 g/dL (ref 6.0–8.5)
eGFR: 66 mL/min/1.73 (ref >59)

## 2024-04-25 LAB — LIPID PANEL
Chol/HDL Ratio: 2.4 ratio (ref 0.0–4.4)
Cholesterol, Total: 113 mg/dL (ref 100–199)
HDL: 48 mg/dL (ref >39)
LDL Chol Calc (NIH): 52 mg/dL (ref 0–99)
Triglycerides: 60 mg/dL (ref 0–149)
VLDL Cholesterol Cal: 13 mg/dL (ref 5–40)

## 2024-04-25 LAB — HEMOGLOBIN A1C
Est. average glucose Bld gHb Est-mCnc: 117 mg/dL
Hgb A1c MFr Bld: 5.7 % — ABNORMAL HIGH (ref 4.8–5.6)

## 2024-04-25 LAB — TSH: TSH: 1.06 u[IU]/mL (ref 0.450–4.500)

## 2024-04-27 ENCOUNTER — Other Ambulatory Visit (HOSPITAL_COMMUNITY): Payer: Self-pay

## 2024-05-02 ENCOUNTER — Ambulatory Visit: Admitting: Internal Medicine

## 2024-05-02 ENCOUNTER — Ambulatory Visit (INDEPENDENT_AMBULATORY_CARE_PROVIDER_SITE_OTHER): Admitting: Nurse Practitioner

## 2024-05-02 ENCOUNTER — Encounter: Payer: Self-pay | Admitting: Nurse Practitioner

## 2024-05-02 VITALS — BP 120/70 | HR 99 | Temp 97.6°F | Ht 61.5 in | Wt 181.4 lb

## 2024-05-02 DIAGNOSIS — E66811 Obesity, class 1: Secondary | ICD-10-CM | POA: Diagnosis not present

## 2024-05-02 DIAGNOSIS — G4719 Other hypersomnia: Secondary | ICD-10-CM | POA: Diagnosis not present

## 2024-05-02 DIAGNOSIS — J449 Chronic obstructive pulmonary disease, unspecified: Secondary | ICD-10-CM

## 2024-05-02 DIAGNOSIS — Z79899 Other long term (current) drug therapy: Secondary | ICD-10-CM | POA: Diagnosis not present

## 2024-05-02 DIAGNOSIS — E6609 Other obesity due to excess calories: Secondary | ICD-10-CM

## 2024-05-02 DIAGNOSIS — R0683 Snoring: Secondary | ICD-10-CM | POA: Insufficient documentation

## 2024-05-02 DIAGNOSIS — Z6833 Body mass index (BMI) 33.0-33.9, adult: Secondary | ICD-10-CM

## 2024-05-02 NOTE — Patient Instructions (Addendum)
 Continue Albuterol  inhaler 2 puffs or 3 mL neb every 6 hours as needed for shortness of breath or wheezing. Notify if symptoms persist despite rescue inhaler/neb use.  Continue Trelegy 1 puff daily. Brush tongue and rinse mouth afterwards Continue saline nasal rinses as needed Continue nasonex  nasal spray as needed  Continue zyrtec daily   Given your symptoms, I am concerned that you may have sleep disordered breathing with sleep apnea. You will need a sleep study for further evaluation. Someone will contact you to schedule this.   We discussed how untreated sleep apnea puts an individual at risk for cardiac arrhthymias, pulm HTN, DM, stroke and increases their risk for daytime accidents. We also briefly reviewed treatment options including weight loss, side sleeping position, oral appliance, CPAP therapy or referral to ENT for possible surgical options  Use caution when driving and pull over if you become sleepy.  Follow up in 6 weeks with Dr. Isaiah or Izetta Aayat Hajjar,NP to go over sleep study results. If symptoms do not improve or worsen, please contact office for sooner follow up or seek emergency care.

## 2024-05-02 NOTE — Progress Notes (Signed)
 @Patient  ID: Christy Mason, female    DOB: 11/28/1960, 63 y.o.   MRN: [id number]  Chief Complaint  Patient presents with   COPD    Getting over a cold. Cough with green sputum in the mornings. No wheezing. No increased SOB.  Trelegy daily, helps with her breathing. Albuterol  PRN.     Referring provider: Donzella Lauraine SAILOR, DO  HPI: 63 year old female, former smoker followed for severe COPD. She is a patient of Dr. Jacqulyn and last seen in office 11/01/2023. Past medical history significant for HTN, GERD, hyperthyroidism, obesity, HLD.   TEST/EVENTS:  11/20/2019 PFT: FVC 69, FEV1 45, ratio 50 11/02/2023 LDCT chest: atherosclerosis. Emphysema. Scattered scarring/atelectasis. Scattered calcified granulomata, benign. Additional scattered small b/l pulmonary nodules measuring up to 3.2 mm, benign. Chronic moderate compression fx. Lung RADS 2  11/01/2023: OV with Dr. Isaiah. Severe COPD with FEV1 45%. ONO completed without any desaturations. Chronic SOB/DOE. Been on Trelegy with has resulted in significant improvement. AECOPD last week treated with z pack. Symptoms resolved. Encouraged weight loss. Continue current regimen.  05/02/2024: Today - follow up Discussed the use of AI scribe software for clinical note transcription with the patient, who gave verbal consent to proceed.  History of Present Illness Christy Mason is a 63 year old female who presents for a pulmonary follow-up and sleep study consultation. She was referred by her primary care physician for a sleep study consultation.  She takes Trelegy daily and seldom uses her albuterol  rescue inhaler. She is recovering from a recent cold and experiences occasional coughing, with no cough yesterday and a little this morning. She notes 'green junk' from her sinuses but has no fevers, chills, or hemoptysis. No current sinus pressure or tenderness is reported. Feels overall she is improving. She did not complete any viral testing.   She has been  referred for a sleep study due to snoring and waking herself up at night. Living alone, she is unsure if she experiences apneic episodes during sleep. She experiences occasional morning headaches and daytime tiredness. Restless sleep with waking up three to five times a night. She does not take any prescribed sleep medications except for melatonin and has no issues with drowsy driving. She's never had a sleep study before.   Goes to bed around 03-1029 pm. Falls asleep in 10-12 min. Gets up around 7 am. No significant weight change. Started on Wegovy  recently.     Allergies  Allergen Reactions   Acetaminophen      Avoid due to fatty liver    Nsaids Other (See Comments)    irritates stomach severley    Other     Steroids-muscle weakness and headache   Sular [Nisoldipine Er] Palpitations    Immunization History  Administered Date(s) Administered   INFLUENZA, HIGH DOSE SEASONAL PF 04/24/2024   Influenza Inj Mdck Quad With Preservative 06/15/2018   Influenza, Seasonal, Injecte, Preservative Fre 04/05/2023   Influenza,inj,Quad PF,6+ Mos 03/30/2022   Moderna Sars-Covid-2 Vaccination 03/27/2020, 05/12/2020   PNEUMOCOCCAL CONJUGATE-20 03/30/2022   Respiratory Syncytial Virus Vaccine ,Recomb Aduvanted(Arexvy ) 04/25/2023    Past Medical History:  Diagnosis Date   Allergy    Anxiety    Arthritis    Asthma    Cataract 2016   Complication of anesthesia    COPD (chronic obstructive pulmonary disease) (HCC)    Emphysema of lung (HCC)    GERD (gastroesophageal reflux disease)    History of hiatal hernia    Hypertension    Hyperthyroidism  h/o   Neuromuscular disorder (HCC)    PONV (postoperative nausea and vomiting)    Thyroid  disease     Tobacco History: Social History   Tobacco Use  Smoking Status Former   Current packs/day: 0.00   Average packs/day: 1.5 packs/day for 44.0 years (66.0 ttl pk-yrs)   Types: Cigarettes   Start date: 4   Quit date: 07/13/2019   Years since  quitting: 4.8  Smokeless Tobacco Never   Counseling given: Not Answered   Outpatient Medications Prior to Visit  Medication Sig Dispense Refill   albuterol  (PROVENTIL ) (2.5 MG/3ML) 0.083% nebulizer solution USE 1 VIAL VIA NEBULIZER EVERY 4 HOURS AS NEEDED FOR WHEEZING OR SHORTNESS OF BREATH 225 mL 6   albuterol  (VENTOLIN  HFA) 108 (90 Base) MCG/ACT inhaler TAKE 2 PUFFS BY MOUTH EVERY 6 HOURS AS NEEDED FOR WHEEZE OR SHORTNESS OF BREATH 18 each 1   aspirin  EC 81 MG tablet Take 81 mg by mouth daily.     atorvastatin  (LIPITOR) 40 MG tablet Take 1 tablet (40 mg total) by mouth daily. 90 tablet 3   cetirizine (ZYRTEC) 10 MG tablet Take 10 mg by mouth daily.     cholecalciferol  (VITAMIN D3) 25 MCG (1000 UNIT) tablet Take 1,000 Units by mouth daily.     desonide  (DESOWEN ) 0.05 % cream Apply topically 2 (two) times daily. 30 g 0   ketoconazole  (NIZORAL ) 2 % cream Apply 1 Application topically daily. 15 g 0   lidocaine  (XYLOCAINE ) 5 % ointment Apply 1 Application topically 2 (two) times daily as needed. 35 g 3   losartan  (COZAAR ) 50 MG tablet TAKE 1 TABLET BY MOUTH EVERY DAY 90 tablet 1   mometasone  (NASONEX ) 50 MCG/ACT nasal spray Place 2 sprays into the nose daily. 1 each 12   Multiple Vitamin (MULTIVITAMIN WITH MINERALS) TABS tablet Take 1 tablet by mouth daily.     omeprazole  (PRILOSEC) 20 MG capsule TAKE 1 CAPSULE BY MOUTH EVERY DAY 90 capsule 1   ondansetron  (ZOFRAN -ODT) 4 MG disintegrating tablet Take 1 tablet (4 mg total) by mouth every 8 (eight) hours as needed for nausea or vomiting. 20 tablet 0   semaglutide -weight management (WEGOVY ) 0.5 MG/0.5ML SOAJ SQ injection Inject 0.5 mg into the skin once a week. 2 mL 0   [START ON 05/04/2024] semaglutide -weight management (WEGOVY ) 1 MG/0.5ML SOAJ SQ injection Inject 1 mg into the skin once a week. 2 mL 3   SUBOXONE  8-2 MG FILM Take by mouth 2 (two) times daily.     TRELEGY ELLIPTA  200-62.5-25 MCG/ACT AEPB INHALE 1 PUFF INTO THE LUNGS EVERY DAY 60  each 10   triamcinolone  (NASACORT ) 55 MCG/ACT AERO nasal inhaler Place 1 spray into the nose daily as needed (allergies). 1 each 5   triamcinolone  ointment (KENALOG ) 0.1 % Apply 1 Application topically 2 (two) times daily. 30 g 1   ZOVIRAX 5 % Apply topically 3 (three) times daily.     No facility-administered medications prior to visit.     Review of Systems: as above    Physical Exam:  BP 120/70   Pulse 99   Temp 97.6 F (36.4 C)   Ht 5' 1.5 (1.562 m)   Wt 181 lb 6.4 oz (82.3 kg)   SpO2 94%   BMI 33.72 kg/m   GEN: Pleasant, interactive, well-appearing; in no acute distress HEENT:  Normocephalic and atraumatic. PERRLA. Sclera white. Nasal turbinates erythematous, moist and patent bilaterally. No rhinorrhea present. Oropharynx pink and moist, without exudate or edema. No  lesions, ulcerations, or postnasal drip. Mallampati III NECK:  Supple w/ fair ROM. No lymphadenopathy.   CV: RRR, no m/r/g, no peripheral edema.  PULMONARY:  Unlabored, regular breathing. Diminished bibasilar airflow otherwise clear bilaterally A&P w/o wheezes/rales/rhonchi. No accessory muscle use.  GI: BS present and normoactive. Soft, non-tender to palpation.  MSK: No erythema, warmth or tenderness. Cap refil <2 sec all extrem.  Neuro: A/Ox3. No focal deficits noted.   Skin: Warm, no lesions or rashe Psych: Normal affect and behavior. Judgement and thought content appropriate.     Lab Results:  CBC    Component Value Date/Time   WBC 10.3 03/30/2022 1016   WBC 15.8 (H) 02/08/2020 0434   RBC 4.53 03/30/2022 1016   RBC 4.05 02/08/2020 0434   HGB 12.5 03/30/2022 1016   HCT 38.5 03/30/2022 1016   PLT 257 03/30/2022 1016   MCV 85 03/30/2022 1016   MCV 92 03/19/2014 1818   MCH 27.6 03/30/2022 1016   MCH 29.9 02/08/2020 0434   MCHC 32.5 03/30/2022 1016   MCHC 32.9 02/08/2020 0434   RDW 15.1 03/30/2022 1016   RDW 14.2 03/19/2014 1818   LYMPHSABS 3.4 (H) 03/30/2022 1016   MONOABS 0.3 01/30/2020  0150   EOSABS 0.4 03/30/2022 1016   BASOSABS 0.1 03/30/2022 1016    BMET    Component Value Date/Time   NA 143 04/24/2024 1042   NA 136 03/19/2014 1818   K 4.7 04/24/2024 1042   K 3.0 (L) 03/19/2014 1818   CL 106 04/24/2024 1042   CL 101 03/19/2014 1818   CO2 19 (L) 04/24/2024 1042   CO2 24 03/19/2014 1818   GLUCOSE 98 04/24/2024 1042   GLUCOSE 130 (H) 02/08/2020 0434   GLUCOSE 139 (H) 03/19/2014 1818   BUN 10 04/24/2024 1042   BUN 10 03/19/2014 1818   CREATININE 0.96 04/24/2024 1042   CREATININE 0.86 03/19/2014 1818   CALCIUM  9.7 04/24/2024 1042   CALCIUM  10.4 (H) 03/19/2014 1818   GFRNONAA 82 04/10/2020 1518   GFRNONAA >60 03/19/2014 1818   GFRAA 95 04/10/2020 1518   GFRAA >60 03/19/2014 1818    BNP    Component Value Date/Time   BNP 410.3 (H) 01/29/2020 1528     Imaging:  No results found.  Administration History     None          Latest Ref Rng & Units 11/20/2019   10:41 AM  PFT Results  FVC-Pre L 2.04  P  FVC-Predicted Pre % 65  P  FVC-Post L 2.17  P  FVC-Predicted Post % 69  P  Pre FEV1/FVC % % 49  P  Post FEV1/FCV % % 50  P  FEV1-Pre L 1.00  P  FEV1-Predicted Pre % 41  P  FEV1-Post L 1.09  P  DLCO uncorrected ml/min/mmHg 12.63  P  DLCO UNC% % 65  P  DLCO corrected ml/min/mmHg 12.63  P  DLCO COR %Predicted % 65  P  DLVA Predicted % 75  P  TLC L 4.94  P  TLC % Predicted % 102  P  RV % Predicted % 140  P    P Preliminary result    No results found for: NITRICOXIDE      Assessment & Plan:   COPD, severe (HCC) Severe COPD. Compensated on current regimen. Recovering well from recent URI. No exacerbation. Trigger prevention reviewed. Action plan in place.   Patient Instructions  Continue Albuterol  inhaler 2 puffs or 3 mL neb every  6 hours as needed for shortness of breath or wheezing. Notify if symptoms persist despite rescue inhaler/neb use.  Continue Trelegy 1 puff daily. Brush tongue and rinse mouth afterwards Continue saline  nasal rinses as needed Continue nasonex  nasal spray as needed  Continue zyrtec daily   Given your symptoms, I am concerned that you may have sleep disordered breathing with sleep apnea. You will need a sleep study for further evaluation. Someone will contact you to schedule this.   We discussed how untreated sleep apnea puts an individual at risk for cardiac arrhthymias, pulm HTN, DM, stroke and increases their risk for daytime accidents. We also briefly reviewed treatment options including weight loss, side sleeping position, oral appliance, CPAP therapy or referral to ENT for possible surgical options  Use caution when driving and pull over if you become sleepy.  Follow up in 6 weeks with Dr. Isaiah or Izetta Jayshun Galentine,NP to go over sleep study results. If symptoms do not improve or worsen, please contact office for sooner follow up or seek emergency care.       Excessive daytime sleepiness She has snoring, excessive daytime sleepiness, restless sleep, morning headaches. BMI 33. Given this,  I am concerned she could have sleep disordered breathing with obstructive sleep apnea. She will need sleep study for further evaluation.    - discussed how weight can impact sleep and risk for sleep disordered breathing - discussed options to assist with weight loss: combination of diet modification, cardiovascular and strength training exercises   - had an extensive discussion regarding the adverse health consequences related to untreated sleep disordered breathing - specifically discussed the risks for hypertension, coronary artery disease, cardiac dysrhythmias, cerebrovascular disease, and diabetes - lifestyle modification discussed   - discussed how sleep disruption can increase risk of accidents, particularly when driving - safe driving practices were discussed   Loud snoring See above  Class 1 obesity with body mass index (BMI) of 33.0 to 33.9 in adult BMI 33. Healthy weight loss  encouraged   Advised if symptoms do not improve or worsen, to please contact office for sooner follow up or seek emergency care.   I spent 35 minutes of dedicated to the care of this patient on the date of this encounter to include pre-visit review of records, face-to-face time with the patient discussing conditions above, post visit ordering of testing, clinical documentation with the electronic health record, making appropriate referrals as documented, and communicating necessary findings to members of the patients care team.  Comer LULLA Rouleau, NP 05/02/2024  Pt aware and understands NP's role.

## 2024-05-02 NOTE — Assessment & Plan Note (Signed)
 She has snoring, excessive daytime sleepiness, restless sleep, morning headaches. BMI 33. Given this,  I am concerned she could have sleep disordered breathing with obstructive sleep apnea. She will need sleep study for further evaluation.    - discussed how weight can impact sleep and risk for sleep disordered breathing - discussed options to assist with weight loss: combination of diet modification, cardiovascular and strength training exercises   - had an extensive discussion regarding the adverse health consequences related to untreated sleep disordered breathing - specifically discussed the risks for hypertension, coronary artery disease, cardiac dysrhythmias, cerebrovascular disease, and diabetes - lifestyle modification discussed   - discussed how sleep disruption can increase risk of accidents, particularly when driving - safe driving practices were discussed

## 2024-05-02 NOTE — Assessment & Plan Note (Signed)
 See above.

## 2024-05-02 NOTE — Assessment & Plan Note (Signed)
 BMI 33. Healthy weight loss encouraged

## 2024-05-02 NOTE — Assessment & Plan Note (Addendum)
 Severe COPD. Compensated on current regimen. Recovering well from recent URI. No exacerbation. Trigger prevention reviewed. Action plan in place.   Patient Instructions  Continue Albuterol  inhaler 2 puffs or 3 mL neb every 6 hours as needed for shortness of breath or wheezing. Notify if symptoms persist despite rescue inhaler/neb use.  Continue Trelegy 1 puff daily. Brush tongue and rinse mouth afterwards Continue saline nasal rinses as needed Continue nasonex  nasal spray as needed  Continue zyrtec daily   Given your symptoms, I am concerned that you may have sleep disordered breathing with sleep apnea. You will need a sleep study for further evaluation. Someone will contact you to schedule this.   We discussed how untreated sleep apnea puts an individual at risk for cardiac arrhthymias, pulm HTN, DM, stroke and increases their risk for daytime accidents. We also briefly reviewed treatment options including weight loss, side sleeping position, oral appliance, CPAP therapy or referral to ENT for possible surgical options  Use caution when driving and pull over if you become sleepy.  Follow up in 6 weeks with Dr. Isaiah or Izetta Briza Bark,NP to go over sleep study results. If symptoms do not improve or worsen, please contact office for sooner follow up or seek emergency care.

## 2024-05-03 ENCOUNTER — Ambulatory Visit: Payer: Self-pay | Admitting: Family Medicine

## 2024-05-07 ENCOUNTER — Other Ambulatory Visit: Payer: Self-pay | Admitting: Family Medicine

## 2024-05-08 NOTE — Telephone Encounter (Signed)
 Please review in provider's absence. Thank you!  LOV- 04/24/2024 NOV- 05/29/2024 LRF- 12/22/2023   Disp Refills Start End   ondansetron  (ZOFRAN -ODT) 4 MG disintegrating tablet 20 tablet 0 12/22/2023 --   Sig - Route: Take 1 tablet (4 mg total) by mouth every 8 (eight) hours as needed for nausea or vomiting. - Oral   Sent to pharmacy as: ondansetron  (ZOFRAN -ODT) 4 MG disintegrating tablet   E-Prescribing Status: Receipt confirmed by pharmacy (12/22/2023 12:50 AM EDT)

## 2024-05-12 ENCOUNTER — Encounter: Payer: Self-pay | Admitting: Family Medicine

## 2024-05-19 ENCOUNTER — Other Ambulatory Visit: Payer: Self-pay | Admitting: Family Medicine

## 2024-05-19 DIAGNOSIS — I739 Peripheral vascular disease, unspecified: Secondary | ICD-10-CM

## 2024-05-19 DIAGNOSIS — I771 Stricture of artery: Secondary | ICD-10-CM

## 2024-05-21 ENCOUNTER — Encounter: Payer: Self-pay | Admitting: Family Medicine

## 2024-05-21 MED ORDER — ONDANSETRON 4 MG PO TBDP: 4.0000 mg | ORAL_TABLET | Freq: Three times a day (TID) | ORAL | 0 refills | Status: AC | PRN

## 2024-05-29 ENCOUNTER — Ambulatory Visit (INDEPENDENT_AMBULATORY_CARE_PROVIDER_SITE_OTHER): Admitting: Family Medicine

## 2024-05-29 ENCOUNTER — Encounter: Payer: Self-pay | Admitting: Family Medicine

## 2024-05-29 VITALS — BP 123/78 | HR 72 | Temp 98.5°F | Ht 61.5 in | Wt 180.2 lb

## 2024-05-29 DIAGNOSIS — R195 Other fecal abnormalities: Secondary | ICD-10-CM

## 2024-05-29 DIAGNOSIS — Z0001 Encounter for general adult medical examination with abnormal findings: Secondary | ICD-10-CM | POA: Diagnosis not present

## 2024-05-29 DIAGNOSIS — Z0000 Encounter for general adult medical examination without abnormal findings: Secondary | ICD-10-CM

## 2024-05-29 NOTE — Patient Instructions (Signed)
 Recommended vaccines to get at pharmacy: shingrix (shingles)
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
## 2024-06-14 ENCOUNTER — Other Ambulatory Visit: Payer: Self-pay | Admitting: Family Medicine

## 2024-06-14 DIAGNOSIS — I10 Essential (primary) hypertension: Secondary | ICD-10-CM

## 2024-06-16 ENCOUNTER — Other Ambulatory Visit: Payer: Self-pay | Admitting: Family Medicine

## 2024-06-16 DIAGNOSIS — J449 Chronic obstructive pulmonary disease, unspecified: Secondary | ICD-10-CM

## 2024-07-22 ENCOUNTER — Other Ambulatory Visit: Payer: Self-pay | Admitting: Family Medicine

## 2024-07-22 DIAGNOSIS — K219 Gastro-esophageal reflux disease without esophagitis: Secondary | ICD-10-CM

## 2025-02-13 ENCOUNTER — Other Ambulatory Visit (INDEPENDENT_AMBULATORY_CARE_PROVIDER_SITE_OTHER)

## 2025-02-13 ENCOUNTER — Ambulatory Visit (INDEPENDENT_AMBULATORY_CARE_PROVIDER_SITE_OTHER): Admitting: Nurse Practitioner
